# Patient Record
Sex: Female | Born: 1995 | Race: White | Hispanic: No | Marital: Married | Smoking: Never smoker
Health system: Southern US, Community
[De-identification: ages and names within clinical notes are randomized; demographics above are authoritative.]

## PROBLEM LIST (undated history)

## (undated) DIAGNOSIS — F53 Postpartum depression: Secondary | ICD-10-CM

## (undated) DIAGNOSIS — E669 Obesity, unspecified: Secondary | ICD-10-CM

## (undated) DIAGNOSIS — K8050 Calculus of bile duct without cholangitis or cholecystitis without obstruction: Secondary | ICD-10-CM

## (undated) DIAGNOSIS — O99345 Other mental disorders complicating the puerperium: Secondary | ICD-10-CM

## (undated) HISTORY — PX: OTHER SURGICAL HISTORY: SHX169

## (undated) HISTORY — DX: Other mental disorders complicating the puerperium: O99.345

## (undated) HISTORY — PX: TONSILLECTOMY AND ADENOIDECTOMY: SHX28

## (undated) HISTORY — PX: TONSILLECTOMY: SUR1361

## (undated) HISTORY — DX: Postpartum depression: F53.0

## (undated) HISTORY — PX: WISDOM TOOTH EXTRACTION: SHX21

---

## 2017-08-25 DIAGNOSIS — H5213 Myopia, bilateral: Secondary | ICD-10-CM | POA: Diagnosis not present

## 2017-11-24 DIAGNOSIS — Z01419 Encounter for gynecological examination (general) (routine) without abnormal findings: Secondary | ICD-10-CM | POA: Diagnosis not present

## 2017-11-24 DIAGNOSIS — Z308 Encounter for other contraceptive management: Secondary | ICD-10-CM | POA: Diagnosis not present

## 2017-12-14 DIAGNOSIS — Z30017 Encounter for initial prescription of implantable subdermal contraceptive: Secondary | ICD-10-CM | POA: Diagnosis not present

## 2017-12-14 DIAGNOSIS — Z3202 Encounter for pregnancy test, result negative: Secondary | ICD-10-CM | POA: Diagnosis not present

## 2018-03-06 DIAGNOSIS — H6123 Impacted cerumen, bilateral: Secondary | ICD-10-CM | POA: Diagnosis not present

## 2018-03-06 DIAGNOSIS — H60501 Unspecified acute noninfective otitis externa, right ear: Secondary | ICD-10-CM | POA: Diagnosis not present

## 2018-03-18 DIAGNOSIS — R12 Heartburn: Secondary | ICD-10-CM | POA: Diagnosis not present

## 2018-03-18 DIAGNOSIS — R109 Unspecified abdominal pain: Secondary | ICD-10-CM | POA: Diagnosis not present

## 2018-04-11 DIAGNOSIS — L259 Unspecified contact dermatitis, unspecified cause: Secondary | ICD-10-CM | POA: Diagnosis not present

## 2018-10-02 DIAGNOSIS — Z3046 Encounter for surveillance of implantable subdermal contraceptive: Secondary | ICD-10-CM | POA: Diagnosis not present

## 2018-10-02 DIAGNOSIS — Z124 Encounter for screening for malignant neoplasm of cervix: Secondary | ICD-10-CM | POA: Diagnosis not present

## 2018-12-05 NOTE — L&D Delivery Note (Signed)
Delivery Note At  1453 a viable and healthy female "Francoise Ceo"  was delivered via  (Presentation:OP ;  ).  APGAR: 8, 9; weight pending skin to skin  .   Placenta status: delivered intact with 3 vessel  Cord:  with the following complications: none  Anesthesia:  epidural Episiotomy:  none Lacerations:  superficial 1st degree- no repair needed Suture Repair: NA Est. Blood Loss (mL):  200  Mom to postpartum.  Baby to Couplet care / Skin to Skin.  Kalim Kissel N Tanmay Halteman 08/22/2019, 3:07 PM

## 2018-12-22 DIAGNOSIS — M79604 Pain in right leg: Secondary | ICD-10-CM | POA: Diagnosis not present

## 2018-12-22 DIAGNOSIS — O26891 Other specified pregnancy related conditions, first trimester: Secondary | ICD-10-CM | POA: Diagnosis not present

## 2018-12-22 DIAGNOSIS — L52 Erythema nodosum: Secondary | ICD-10-CM | POA: Diagnosis not present

## 2018-12-22 DIAGNOSIS — Z3A00 Weeks of gestation of pregnancy not specified: Secondary | ICD-10-CM | POA: Diagnosis not present

## 2018-12-22 DIAGNOSIS — R7989 Other specified abnormal findings of blood chemistry: Secondary | ICD-10-CM | POA: Diagnosis not present

## 2018-12-22 DIAGNOSIS — O9989 Other specified diseases and conditions complicating pregnancy, childbirth and the puerperium: Secondary | ICD-10-CM | POA: Diagnosis not present

## 2018-12-25 DIAGNOSIS — N912 Amenorrhea, unspecified: Secondary | ICD-10-CM | POA: Diagnosis not present

## 2019-01-01 DIAGNOSIS — Z3490 Encounter for supervision of normal pregnancy, unspecified, unspecified trimester: Secondary | ICD-10-CM | POA: Diagnosis not present

## 2019-01-08 DIAGNOSIS — Z3490 Encounter for supervision of normal pregnancy, unspecified, unspecified trimester: Secondary | ICD-10-CM | POA: Diagnosis not present

## 2019-01-16 DIAGNOSIS — Z3490 Encounter for supervision of normal pregnancy, unspecified, unspecified trimester: Secondary | ICD-10-CM | POA: Diagnosis not present

## 2019-01-16 DIAGNOSIS — Z3A01 Less than 8 weeks gestation of pregnancy: Secondary | ICD-10-CM | POA: Diagnosis not present

## 2019-01-20 DIAGNOSIS — Z3490 Encounter for supervision of normal pregnancy, unspecified, unspecified trimester: Secondary | ICD-10-CM | POA: Diagnosis not present

## 2019-01-20 DIAGNOSIS — Z3A01 Less than 8 weeks gestation of pregnancy: Secondary | ICD-10-CM | POA: Diagnosis not present

## 2019-02-05 DIAGNOSIS — Z36 Encounter for antenatal screening for chromosomal anomalies: Secondary | ICD-10-CM | POA: Diagnosis not present

## 2019-02-05 DIAGNOSIS — Z3A10 10 weeks gestation of pregnancy: Secondary | ICD-10-CM | POA: Diagnosis not present

## 2019-02-05 DIAGNOSIS — O99210 Obesity complicating pregnancy, unspecified trimester: Secondary | ICD-10-CM | POA: Diagnosis not present

## 2019-02-05 DIAGNOSIS — Z3401 Encounter for supervision of normal first pregnancy, first trimester: Secondary | ICD-10-CM | POA: Diagnosis not present

## 2019-02-05 DIAGNOSIS — Z31430 Encounter of female for testing for genetic disease carrier status for procreative management: Secondary | ICD-10-CM | POA: Diagnosis not present

## 2019-02-05 DIAGNOSIS — Z3490 Encounter for supervision of normal pregnancy, unspecified, unspecified trimester: Secondary | ICD-10-CM | POA: Diagnosis not present

## 2019-02-19 DIAGNOSIS — Z3A10 10 weeks gestation of pregnancy: Secondary | ICD-10-CM | POA: Diagnosis not present

## 2019-02-19 DIAGNOSIS — Z3490 Encounter for supervision of normal pregnancy, unspecified, unspecified trimester: Secondary | ICD-10-CM | POA: Diagnosis not present

## 2019-04-07 DIAGNOSIS — R1030 Lower abdominal pain, unspecified: Secondary | ICD-10-CM | POA: Diagnosis not present

## 2019-04-07 DIAGNOSIS — R109 Unspecified abdominal pain: Secondary | ICD-10-CM | POA: Diagnosis not present

## 2019-04-07 DIAGNOSIS — D72829 Elevated white blood cell count, unspecified: Secondary | ICD-10-CM | POA: Diagnosis not present

## 2019-04-07 DIAGNOSIS — O219 Vomiting of pregnancy, unspecified: Secondary | ICD-10-CM | POA: Diagnosis not present

## 2019-04-07 DIAGNOSIS — O26892 Other specified pregnancy related conditions, second trimester: Secondary | ICD-10-CM | POA: Diagnosis not present

## 2019-04-07 DIAGNOSIS — O9989 Other specified diseases and conditions complicating pregnancy, childbirth and the puerperium: Secondary | ICD-10-CM | POA: Diagnosis not present

## 2019-04-07 DIAGNOSIS — M545 Low back pain: Secondary | ICD-10-CM | POA: Diagnosis not present

## 2019-04-07 DIAGNOSIS — O99112 Other diseases of the blood and blood-forming organs and certain disorders involving the immune mechanism complicating pregnancy, second trimester: Secondary | ICD-10-CM | POA: Diagnosis not present

## 2019-04-07 DIAGNOSIS — Z9049 Acquired absence of other specified parts of digestive tract: Secondary | ICD-10-CM | POA: Diagnosis not present

## 2019-04-07 DIAGNOSIS — R112 Nausea with vomiting, unspecified: Secondary | ICD-10-CM | POA: Diagnosis not present

## 2019-04-07 DIAGNOSIS — Z3A19 19 weeks gestation of pregnancy: Secondary | ICD-10-CM | POA: Diagnosis not present

## 2019-04-09 DIAGNOSIS — Z369 Encounter for antenatal screening, unspecified: Secondary | ICD-10-CM | POA: Diagnosis not present

## 2019-04-09 DIAGNOSIS — Z3A19 19 weeks gestation of pregnancy: Secondary | ICD-10-CM | POA: Diagnosis not present

## 2019-04-09 DIAGNOSIS — Z3490 Encounter for supervision of normal pregnancy, unspecified, unspecified trimester: Secondary | ICD-10-CM | POA: Diagnosis not present

## 2019-04-11 DIAGNOSIS — Z3A19 19 weeks gestation of pregnancy: Secondary | ICD-10-CM | POA: Diagnosis not present

## 2019-04-11 DIAGNOSIS — Z369 Encounter for antenatal screening, unspecified: Secondary | ICD-10-CM | POA: Diagnosis not present

## 2019-04-17 DIAGNOSIS — Z3483 Encounter for supervision of other normal pregnancy, third trimester: Secondary | ICD-10-CM | POA: Diagnosis not present

## 2019-04-17 DIAGNOSIS — Z3482 Encounter for supervision of other normal pregnancy, second trimester: Secondary | ICD-10-CM | POA: Diagnosis not present

## 2019-06-05 DIAGNOSIS — Z3490 Encounter for supervision of normal pregnancy, unspecified, unspecified trimester: Secondary | ICD-10-CM | POA: Diagnosis not present

## 2019-06-05 LAB — OB RESULTS CONSOLE HEPATITIS B SURFACE ANTIGEN: Hepatitis B Surface Ag: NEGATIVE

## 2019-06-11 ENCOUNTER — Telehealth: Payer: Self-pay | Admitting: Certified Nurse Midwife

## 2019-06-11 NOTE — Telephone Encounter (Signed)
Pt's previous office *[place] a MRR to our office to obtain records, Sent fax notifying the office Pt has not been seen in our office yet. Thank you.

## 2019-06-26 DIAGNOSIS — Z3490 Encounter for supervision of normal pregnancy, unspecified, unspecified trimester: Secondary | ICD-10-CM | POA: Diagnosis not present

## 2019-06-26 DIAGNOSIS — Z3A30 30 weeks gestation of pregnancy: Secondary | ICD-10-CM | POA: Diagnosis not present

## 2019-06-26 DIAGNOSIS — O26849 Uterine size-date discrepancy, unspecified trimester: Secondary | ICD-10-CM | POA: Diagnosis not present

## 2019-07-03 ENCOUNTER — Ambulatory Visit (INDEPENDENT_AMBULATORY_CARE_PROVIDER_SITE_OTHER): Payer: 59 | Admitting: Certified Nurse Midwife

## 2019-07-03 ENCOUNTER — Other Ambulatory Visit: Payer: Self-pay

## 2019-07-03 ENCOUNTER — Encounter: Payer: Self-pay | Admitting: Certified Nurse Midwife

## 2019-07-03 VITALS — BP 103/67 | HR 90 | Ht 64.0 in | Wt 217.4 lb

## 2019-07-03 DIAGNOSIS — Z3403 Encounter for supervision of normal first pregnancy, third trimester: Secondary | ICD-10-CM

## 2019-07-03 LAB — POCT URINALYSIS DIPSTICK OB
Bilirubin, UA: NEGATIVE
Blood, UA: NEGATIVE
Glucose, UA: NEGATIVE
Ketones, UA: NEGATIVE
Leukocytes, UA: NEGATIVE
Nitrite, UA: NEGATIVE
POC,PROTEIN,UA: NEGATIVE
Spec Grav, UA: 1.02 (ref 1.010–1.025)
Urobilinogen, UA: 0.2 E.U./dL
pH, UA: 6 (ref 5.0–8.0)

## 2019-07-03 NOTE — Patient Instructions (Signed)

## 2019-07-03 NOTE — Progress Notes (Signed)
TRANSFER IN OB HISTORY AND PHYSICAL  SUBJECTIVE:       Valerie Garner is a 23 y.o. No obstetric history on file. female, Patient's last menstrual period was 11/19/2018., Estimated Date of Delivery: None noted., Unknown, presents today for Transition of Prenatal Care. EPIC data migration has not occurred today. Medical release signed. Pt state she has been getting regular care . Glucose test was negative per pt.    Gynecologic History Patient's last menstrual period was 11/19/2018. Normal Contraception: none Last Pap: Oct 2019. Results were: normal per pt  Obstetric History OB History  Gravida Para Term Preterm AB Living  1            SAB TAB Ectopic Multiple Live Births               # Outcome Date GA Lbr Len/2nd Weight Sex Delivery Anes PTL Lv  1 Current             History reviewed. No pertinent past medical history.  History reviewed. No pertinent surgical history.  Current Outpatient Medications on File Prior to Visit  Medication Sig Dispense Refill  . metoCLOPramide (REGLAN) 10 MG tablet Take 10 mg by mouth as needed.    . ondansetron (ZOFRAN) 4 MG tablet Take 4 mg by mouth as needed.    . Prenatal Vit-Fe Fumarate-FA (MULTIVITAMIN-PRENATAL) 27-0.8 MG TABS tablet Take 1 tablet by mouth daily at 12 noon.     No current facility-administered medications on file prior to visit.     No Known Allergies  Social History   Socioeconomic History  . Marital status: Married    Spouse name: Not on file  . Number of children: Not on file  . Years of education: Not on file  . Highest education level: Not on file  Occupational History  . Not on file  Social Needs  . Financial resource strain: Not on file  . Food insecurity    Worry: Not on file    Inability: Not on file  . Transportation needs    Medical: Not on file    Non-medical: Not on file  Tobacco Use  . Smoking status: Not on file  Substance and Sexual Activity  . Alcohol use: Not on file  . Drug use: Not  on file  . Sexual activity: Not on file  Lifestyle  . Physical activity    Days per week: Not on file    Minutes per session: Not on file  . Stress: Not on file  Relationships  . Social Herbalist on phone: Not on file    Gets together: Not on file    Attends religious service: Not on file    Active member of club or organization: Not on file    Attends meetings of clubs or organizations: Not on file    Relationship status: Not on file  . Intimate partner violence    Fear of current or ex partner: Not on file    Emotionally abused: Not on file    Physically abused: Not on file    Forced sexual activity: Not on file  Other Topics Concern  . Not on file  Social History Narrative  . Not on file    History reviewed. No pertinent family history.  The following portions of the patient's history were reviewed and updated as appropriate: allergies, current medications, past OB history, past medical history, past surgical history, past family history, past social history, and problem  list.    OBJECTIVE: Initial Physical Exam (New OB)  GENERAL APPEARANCE: alert, well appearing, in no apparent distress, overweight HEAD: normocephalic, atraumatic MOUTH: mucous membranes moist, pharynx normal without lesions THYROID: no thyromegaly or masses present BREASTS: no masses noted, no significant tenderness, no palpable axillary nodes, no skin changes, not examined LUNGS: clear to auscultation, no wheezes, rales or rhonchi, symmetric air entry HEART: regular rate and rhythm, no murmurs ABDOMEN: soft, nontender, nondistended, no abnormal masses, no epigastric pain and fundus soft, nontender 31 weeks size EXTREMITIES: no redness or tenderness in the calves or thighs, no edema, no limitation in range of motion, intact peripheral pulses SKIN: normal coloration and turgor, no rashes LYMPH NODES: no adenopathy palpable NEUROLOGIC: alert, oriented, normal speech, no focal findings or  movement disorder noted  PELVIC EXAM not examined   ASSESSMENT: Normal pregnancy  PLAN: New OB counseling: The patient has been given an overview regarding routine prenatal care. Recommendations regarding diet, weight gain, and exercise in pregnancy were given. Prenatal testing, optional genetic testing, and ultrasound use in pregnancy were reviewed. PT states she had done at 10 wks . Results were normal.   S Benefits of Breast Feeding were discussed. The patient is encouraged to consider nursing her baby post partum.Follow up 2 wk ROB with Melody.  Doreene BurkeAnnie Monte Zinni, CNM

## 2019-07-03 NOTE — Progress Notes (Signed)
NP NOB transfer, no complaints.

## 2019-07-04 LAB — OB RESULTS CONSOLE ABO/RH: RH Type: POSITIVE

## 2019-07-04 LAB — OB RESULTS CONSOLE HIV ANTIBODY (ROUTINE TESTING): HIV: NONREACTIVE

## 2019-07-04 LAB — OB RESULTS CONSOLE GC/CHLAMYDIA
Chlamydia: NEGATIVE
Gonorrhea: NEGATIVE

## 2019-07-04 LAB — OB RESULTS CONSOLE PLATELET COUNT: Platelets: 360

## 2019-07-04 LAB — OB RESULTS CONSOLE RPR: RPR: NONREACTIVE

## 2019-07-04 LAB — OB RESULTS CONSOLE VARICELLA ZOSTER ANTIBODY, IGG: Varicella: IMMUNE

## 2019-07-04 LAB — OB RESULTS CONSOLE RUBELLA ANTIBODY, IGM: Rubella: IMMUNE

## 2019-07-25 ENCOUNTER — Other Ambulatory Visit: Payer: Self-pay

## 2019-07-25 ENCOUNTER — Ambulatory Visit (INDEPENDENT_AMBULATORY_CARE_PROVIDER_SITE_OTHER): Payer: 59 | Admitting: Obstetrics and Gynecology

## 2019-07-25 VITALS — BP 103/56 | HR 85 | Wt 213.4 lb

## 2019-07-25 DIAGNOSIS — Z3493 Encounter for supervision of normal pregnancy, unspecified, third trimester: Secondary | ICD-10-CM

## 2019-07-25 LAB — POCT URINALYSIS DIPSTICK OB
Bilirubin, UA: NEGATIVE
Blood, UA: NEGATIVE
Glucose, UA: NEGATIVE
Ketones, UA: NEGATIVE
Leukocytes, UA: NEGATIVE
Nitrite, UA: NEGATIVE
POC,PROTEIN,UA: NEGATIVE
Spec Grav, UA: 1.015 (ref 1.010–1.025)
Urobilinogen, UA: 0.2 E.U./dL
pH, UA: 6 (ref 5.0–8.0)

## 2019-07-25 NOTE — Progress Notes (Signed)
ROB- reviewed labs and records, discussed classes and will look at them on line. Desires breast feeding. Cultures next visit, will work on birth plan.

## 2019-07-25 NOTE — Progress Notes (Signed)
ROB- pt is doing well 

## 2019-08-01 ENCOUNTER — Telehealth: Payer: Self-pay | Admitting: Certified Nurse Midwife

## 2019-08-01 NOTE — Telephone Encounter (Signed)
The patient called and stated that she needs to speak with a provider today if possible. Please advise.

## 2019-08-01 NOTE — Telephone Encounter (Signed)
Called and spoke with patient.  Patient wondering of lice treatment is ok during pregnancy.  Advised patient to use RID lice treatment and to avoid [place].  Patient verbalized understanding.

## 2019-08-07 ENCOUNTER — Telehealth: Payer: Self-pay

## 2019-08-07 NOTE — Telephone Encounter (Signed)
Coronavirus (COVID-19) Are you at risk?  Are you at risk for the Coronavirus (COVID-19)?  To be considered HIGH RISK for Coronavirus (COVID-19), you have to meet the following criteria:  . Traveled to [place], [place], [place], [place] or [place]; or in the [place] to [place], [place], [place], or [place]; and have fever, cough, and shortness of breath within the last 2 weeks of travel OR . Been in close contact with a person diagnosed with COVID-19 within the last 2 weeks and have fever, cough, and shortness of breath . IF YOU DO NOT MEET THESE CRITERIA, YOU ARE CONSIDERED LOW RISK FOR COVID-19.  What to do if you are HIGH RISK for COVID-19?  . If you are having a medical emergency, call 911. . Seek medical care right away. Before you go to a doctor's office, urgent care or emergency department, call ahead and tell them about your recent travel, contact with someone diagnosed with COVID-19, and your symptoms. You should receive instructions from your physician's office regarding next steps of care.  . When you arrive at healthcare provider, tell the healthcare staff immediately you have returned from visiting [place], [place], [place], [place] or [place]; or traveled in the [place] to [place], [place], [place], or [place]; in the last two weeks or you have been in close contact with a person diagnosed with COVID-19 in the last 2 weeks.   . Tell the health care staff about your symptoms: fever, cough and shortness of breath. . After you have been seen by a medical provider, you will be either: o Tested for (COVID-19) and discharged home on quarantine except to seek medical care if symptoms worsen, and asked to  - Stay home and avoid contact with others until you get your results (4-5 days)  - Avoid travel on public transportation if possible (such as bus, train, or airplane) or o Sent to the Emergency Department by EMS for evaluation, COVID-19 testing, and possible  admission depending on your condition and test results.  What to do if you are LOW RISK for COVID-19?  Reduce your risk of any infection by using the same precautions used for avoiding the common cold or flu:  . Wash your hands often with soap and warm water for at least 20 seconds.  If soap and water are not readily available, use an alcohol-based hand sanitizer with at least 60% alcohol.  . If coughing or sneezing, cover your mouth and nose by coughing or sneezing into the elbow areas of your shirt or coat, into a tissue or into your sleeve (not your hands). . Avoid shaking hands with others and consider head nods or verbal greetings only. . Avoid touching your eyes, nose, or mouth with unwashed hands.  . Avoid close contact with people who are Valerie Garner. . Avoid places or events with large numbers of people in one location, like concerts or sporting events. . Carefully consider travel plans you have or are making. . If you are planning any travel outside or inside the [place], visit the CDC's Travelers' Health webpage for the latest health notices. . If you have some symptoms but not all symptoms, continue to monitor at home and seek medical attention if your symptoms worsen. . If you are having a medical emergency, call 911.  08/07/19 SCREENING NEG SLS ADDITIONAL HEALTHCARE OPTIONS FOR PATIENTS  [facility] / e-Visit: [email or web address]         [facility]: [phone]    [facility]: [phone]                   [facility]: [phone]  

## 2019-08-08 ENCOUNTER — Other Ambulatory Visit: Payer: Self-pay

## 2019-08-08 ENCOUNTER — Ambulatory Visit (INDEPENDENT_AMBULATORY_CARE_PROVIDER_SITE_OTHER): Payer: 59 | Admitting: Certified Nurse Midwife

## 2019-08-08 VITALS — BP 108/86 | HR 91 | Wt 210.2 lb

## 2019-08-08 DIAGNOSIS — Z3A36 36 weeks gestation of pregnancy: Secondary | ICD-10-CM

## 2019-08-08 DIAGNOSIS — O99213 Obesity complicating pregnancy, third trimester: Secondary | ICD-10-CM | POA: Insufficient documentation

## 2019-08-08 DIAGNOSIS — Z3685 Encounter for antenatal screening for Streptococcus B: Secondary | ICD-10-CM | POA: Diagnosis not present

## 2019-08-08 DIAGNOSIS — Z113 Encounter for screening for infections with a predominantly sexual mode of transmission: Secondary | ICD-10-CM | POA: Diagnosis not present

## 2019-08-08 DIAGNOSIS — Z3493 Encounter for supervision of normal pregnancy, unspecified, third trimester: Secondary | ICD-10-CM | POA: Diagnosis not present

## 2019-08-08 DIAGNOSIS — O99210 Obesity complicating pregnancy, unspecified trimester: Secondary | ICD-10-CM | POA: Insufficient documentation

## 2019-08-08 LAB — POCT URINALYSIS DIPSTICK OB
Bilirubin, UA: NEGATIVE
Blood, UA: NEGATIVE
Glucose, UA: NEGATIVE
Leukocytes, UA: NEGATIVE
Nitrite, UA: NEGATIVE
Spec Grav, UA: 1.025 (ref 1.010–1.025)
Urobilinogen, UA: 0.2 E.U./dL
pH, UA: 6 (ref 5.0–8.0)

## 2019-08-08 NOTE — Progress Notes (Signed)
ROB-Reports single episodes of left lower abdominal pain. Discussed home treatment measures including use of abdominal support. Third trimester handouts provided. 36 week cultures collected, see orders. Anticipatory guidance regarding course of prenatal care. Reviewed red flag symptoms, signs of labor and when to call. RTC x 1 week for ROB or sooner if needed.

## 2019-08-08 NOTE — Progress Notes (Signed)
ROB-Patient c/o 1 episode of lower left abdominal pain when turning 3 days ago.

## 2019-08-08 NOTE — Patient Instructions (Addendum)
Pain Relief During Labor and Delivery Many things can cause pain during labor and delivery, including:  Pressure on bones and ligaments due to the baby moving through the pelvis.  Stretching of tissues due to the baby moving through the birth canal.  Muscle tension due to anxiety or nervousness.  The uterus tightening (contracting) and relaxing to help move the baby. There are many ways to deal with the pain of labor and delivery. They include:  Taking prenatal classes. Taking these classes helps you know what to expect during your baby's birth. What you learn will increase your confidence and decrease your anxiety.  Practicing relaxation techniques or doing relaxing activities, such as: ? Focused breathing. ? Meditation. ? Visualization. ? Aroma therapy. ? Listening to your favorite music. ? Hypnosis.  Taking a warm shower or bath (hydrotherapy). This may: ? Provide comfort and relaxation. ? Lessen your perception of pain. ? Decrease the amount of pain medicine needed. ? Decrease the length of labor.  Getting a massage or counterpressure on your back.  Applying warm packs or ice packs.  Changing positions often, moving around, or using a birthing ball.  Getting: ? Pain medicine through an IV or injection into a muscle. ? Pain medicine inserted into your spinal column. ? Injections of sterile water just under the skin on your lower back (intradermal injections). ? Laughing gas (nitrous oxide). Discuss your pain control options with your health care provider during your prenatal visits. Explore the options offered by your hospital or birth center. What kinds of medicine are available? There are two kinds of medicines that can be used to relieve pain during labor and delivery:  Analgesics. These medicines decrease pain without causing you to lose feeling or the ability to move your muscles.  Anesthetics. These medicines block feeling in the body and can decrease your  ability to move freely. Both of these kinds of medicine can cause minor side effects, such as nausea, trouble concentrating, and sleepiness. They can also decrease the baby's heart rate before birth and affect the baby's breathing rate after birth. For this reason, health care providers are careful about when and how much medicine is given. What are specific medicines and procedures that provide pain relief? Local Anesthetics Local anesthetics are used to numb a small area of the body. They may be used along with another kind of anesthetic or used to numb the nerves of the vagina, cervix, and perineum during the second stage of labor. General Anesthetics General anesthetics cause you to lose consciousness so you do not feel pain. They are usually only used for an emergency cesarean delivery. General anesthetics are given through an IV tube and a mask. Pudendal Block A pudendal block is a form of local anesthetic. It may be used to relieve the pain associated with pushing or stretching of the perineum at the time of delivery or to further numb the perineum. A pudendal block is done by injecting numbing medicine through the vaginal wall into a nerve in the pelvis. Epidural Analgesia Epidural analgesia is given through a flexible IV catheter that is inserted into the lower back. Numbing medicine is delivered continuously to the area near your spinal column nerves (epidural space). After having this type of analgesia, you may be able to move your legs but you most likely will not be able to walk. Depending on the amount of medicine given, you may lose all feeling in the lower half of your body, or you may retain some level   of sensation, including the urge to push. Epidural analgesia can be used to provide pain relief for a vaginal birth. Spinal Block A spinal block is similar to epidural analgesia, but the medicine is injected into the spinal fluid instead of the epidural space. A spinal block is only given  once. It starts to relieve pain quickly, but the pain relief lasts only 1-6 hours. Spinal blocks can be used for cesarean deliveries. Combined Spinal-Epidural (CSE) Block A CSE block combines the effects of a spinal block and epidural analgesia. The spinal block works quickly to block all pain. The epidural analgesia provides continuous pain relief, even after the effects of the spinal block have worn off. This information is not intended to replace advice given to you by your health care provider. Make sure you discuss any questions you have with your health care provider. Document Released: 03/09/2009 Document Revised: 11/03/2017 Document Reviewed: 04/13/2016 Elsevier Patient Education  2020 [employer]. Fetal Movement Counts Patient Name: ________________________________________________ Patient Due Date: ____________________ What is a fetal movement count?  A fetal movement count is the number of times that you feel your baby move during a certain amount of time. This may also be called a fetal kick count. A fetal movement count is recommended for every pregnant woman. You may be asked to start counting fetal movements as early as week 28 of your pregnancy. Pay attention to when your baby is most active. You may notice your baby's sleep and wake cycles. You may also notice things that make your baby move more. You should do a fetal movement count:  When your baby is normally most active.  At the same time each day. A good time to count movements is while you are resting, after having something to eat and drink. How do I count fetal movements? 1. Find a quiet, comfortable area. Sit, or lie down on your side. 2. Write down the date, the start time and stop time, and the number of movements that you felt between those two times. Take this information with you to your health care visits. 3. For 2 hours, count kicks, flutters, swishes, rolls, and jabs. You should feel at least 10 movements during  2 hours. 4. You may stop counting after you have felt 10 movements. 5. If you do not feel 10 movements in 2 hours, have something to eat and drink. Then, keep resting and counting for 1 hour. If you feel at least 4 movements during that hour, you may stop counting. Contact a health care provider if:  You feel fewer than 4 movements in 2 hours.  Your baby is not moving like he or she usually does. Date: ____________ Start time: ____________ Stop time: ____________ Movements: ____________ Date: ____________ Start time: ____________ Stop time: ____________ Movements: ____________ Date: ____________ Start time: ____________ Stop time: ____________ Movements: ____________ Date: ____________ Start time: ____________ Stop time: ____________ Movements: ____________ Date: ____________ Start time: ____________ Stop time: ____________ Movements: ____________ Date: ____________ Start time: ____________ Stop time: ____________ Movements: ____________ Date: ____________ Start time: ____________ Stop time: ____________ Movements: ____________ Date: ____________ Start time: ____________ Stop time: ____________ Movements: ____________ Date: ____________ Start time: ____________ Stop time: ____________ Movements: ____________ This information is not intended to replace advice given to you by your health care provider. Make sure you discuss any questions you have with your health care provider. Document Released: 12/21/2006 Document Revised: 12/11/2018 Document Reviewed: 12/31/2015 Elsevier Patient Education  2020 [employer]. Vaginal Delivery  Vaginal delivery means that  you give birth by pushing your baby out of your birth canal (vagina). A team of health care providers will help you before, during, and after vaginal delivery. Birth experiences are unique for every woman and every pregnancy, and birth experiences vary depending on where you choose to give birth. What happens when I arrive at the birth center  or hospital? Once you are in labor and have been admitted into the hospital or birth center, your health care provider may:  Review your pregnancy history and any concerns that you have.  Insert an IV into one of your veins. This may be used to give you fluids and medicines.  Check your blood pressure, pulse, temperature, and heart rate (vital signs).  Check whether your bag of water (amniotic sac) has broken (ruptured).  Talk with you about your birth plan and discuss pain control options. Monitoring Your health care provider may monitor your contractions (uterine monitoring) and your baby's heart rate (fetal monitoring). You may need to be monitored:  Often, but not continuously (intermittently).  All the time or for long periods at a time (continuously). Continuous monitoring may be needed if: ? You are taking certain medicines, such as medicine to relieve pain or make your contractions stronger. ? You have pregnancy or labor complications. Monitoring may be done by:  Placing a special stethoscope or a handheld monitoring device on your abdomen to check your baby's heartbeat and to check for contractions.  Placing monitors on your abdomen (external monitors) to record your baby's heartbeat and the frequency and length of contractions.  Placing monitors inside your uterus through your vagina (internal monitors) to record your baby's heartbeat and the frequency, length, and strength of your contractions. Depending on the type of monitor, it may remain in your uterus or on your baby's head until birth.  Telemetry. This is a type of continuous monitoring that can be done with external or internal monitors. Instead of having to stay in bed, you are able to move around during telemetry. Physical exam Your health care provider may perform frequent physical exams. This may include:  Checking how and where your baby is positioned in your uterus.  Checking your cervix to determine: ?  Whether it is thinning out (effacing). ? Whether it is opening up (dilating). What happens during labor and delivery?  Normal labor and delivery is divided into the following three stages: Stage 1  This is the longest stage of labor.  This stage can last for hours or days.  Throughout this stage, you will feel contractions. Contractions generally feel mild, infrequent, and irregular at first. They get stronger, more frequent (about every 2-3 minutes), and more regular as you move through this stage.  This stage ends when your cervix is completely dilated to 4 inches (10 cm) and completely effaced. Stage 2  This stage starts once your cervix is completely effaced and dilated and lasts until the delivery of your baby.  This stage may last from 20 minutes to 2 hours.  This is the stage where you will feel an urge to push your baby out of your vagina.  You may feel stretching and burning pain, especially when the widest part of your baby's head passes through the vaginal opening (crowning).  Once your baby is delivered, the umbilical cord will be clamped and cut. This usually occurs after waiting a period of 1-2 minutes after delivery.  Your baby will be placed on your bare chest (skin-to-skin contact) in an upright position  and covered with a warm blanket. Watch your baby for feeding cues, like rooting or sucking, and help the baby to your breast for his or her first feeding. Stage 3  This stage starts immediately after the birth of your baby and ends after you deliver the placenta.  This stage may take anywhere from 5 to 30 minutes.  After your baby has been delivered, you will feel contractions as your body expels the placenta and your uterus contracts to control bleeding. What can I expect after labor and delivery?  After labor is over, you and your baby will be monitored closely until you are ready to go home to ensure that you are both healthy. Your health care team will teach  you how to care for yourself and your baby.  You and your baby will stay in the same room (rooming in) during your hospital stay. This will encourage early bonding and successful breastfeeding.  You may continue to receive fluids and medicines through an IV.  Your uterus will be checked and massaged regularly (fundal massage).  You will have some soreness and pain in your abdomen, vagina, and the area of skin between your vaginal opening and your anus (perineum).  If an incision was made near your vagina (episiotomy) or if you had some vaginal tearing during delivery, cold compresses may be placed on your episiotomy or your tear. This helps to reduce pain and swelling.  You may be given a squirt bottle to use instead of wiping when you go to the bathroom. To use the squirt bottle, follow these steps: ? Before you urinate, fill the squirt bottle with warm water. Do not use hot water. ? After you urinate, while you are sitting on the toilet, use the squirt bottle to rinse the area around your urethra and vaginal opening. This rinses away any urine and blood. ? Fill the squirt bottle with clean water every time you use the bathroom.  It is normal to have vaginal bleeding after delivery. Wear a sanitary pad for vaginal bleeding and discharge. Summary  Vaginal delivery means that you will give birth by pushing your baby out of your birth canal (vagina).  Your health care provider may monitor your contractions (uterine monitoring) and your baby's heart rate (fetal monitoring).  Your health care provider may perform a physical exam.  Normal labor and delivery is divided into three stages.  After labor is over, you and your baby will be monitored closely until you are ready to go home. This information is not intended to replace advice given to you by your health care provider. Make sure you discuss any questions you have with your health care provider. Document Released: 08/30/2008 Document  Revised: 12/26/2017 Document Reviewed: 12/26/2017 Elsevier Patient Education  2020 [employer]. [facility]  [address] [place], [place], [place] [address]  Phone: [phone]   [facility] (second location)  [address] [place], [place] [address]  Phone: [phone]   [facility]) [address] [place], [place], [place] [address] Phone: [phone]   [facility]  [address], [place], [place] [address]  Phone: [phone]

## 2019-08-10 LAB — STREP GP B NAA: Strep Gp B NAA: POSITIVE — AB

## 2019-08-12 ENCOUNTER — Other Ambulatory Visit: Payer: Self-pay

## 2019-08-12 ENCOUNTER — Observation Stay
Admission: EM | Admit: 2019-08-12 | Discharge: 2019-08-12 | Disposition: A | Discharge status: Home / Self Care | Payer: 59 | Attending: Obstetrics and Gynecology | Admitting: Obstetrics and Gynecology

## 2019-08-12 DIAGNOSIS — Z3A37 37 weeks gestation of pregnancy: Secondary | ICD-10-CM | POA: Insufficient documentation

## 2019-08-12 DIAGNOSIS — Z3403 Encounter for supervision of normal first pregnancy, third trimester: Principal | ICD-10-CM | POA: Insufficient documentation

## 2019-08-12 LAB — RUPTURE OF MEMBRANE (ROM)PLUS: Rom Plus: NEGATIVE

## 2019-08-12 NOTE — OB Triage Note (Signed)
Pt presents to L&D stating she thinks she might be leaking fluid. Pt is a G1P0 [redacted]w[redacted]d. Pt denies vaginal bleeding or contractions and states positive fetal movement. Pt states she is concerned about her fluid leaking due to not gaining weight during her pregnancy. Pt describes the fluid as "thick clear discharge". Pt states, "my mother told me to get checked out and see if baby has enough fluid since I did not gain a single pound during the pregnancy." External monitors applied and assessing. Initial FHT 150. VS WNL. ROM plus ordered and resulted negative.

## 2019-08-13 LAB — GC/CHLAMYDIA PROBE AMP
Chlamydia trachomatis, NAA: NEGATIVE
Neisseria Gonorrhoeae by PCR: NEGATIVE

## 2019-08-14 ENCOUNTER — Encounter: Payer: Self-pay | Admitting: Certified Nurse Midwife

## 2019-08-14 DIAGNOSIS — B951 Streptococcus, group B, as the cause of diseases classified elsewhere: Secondary | ICD-10-CM | POA: Insufficient documentation

## 2019-08-15 ENCOUNTER — Ambulatory Visit (INDEPENDENT_AMBULATORY_CARE_PROVIDER_SITE_OTHER): Payer: 59 | Admitting: Certified Nurse Midwife

## 2019-08-15 ENCOUNTER — Other Ambulatory Visit: Payer: Self-pay

## 2019-08-15 VITALS — BP 123/87 | HR 112 | Wt 212.1 lb

## 2019-08-15 DIAGNOSIS — Z3493 Encounter for supervision of normal pregnancy, unspecified, third trimester: Secondary | ICD-10-CM

## 2019-08-15 LAB — POCT URINALYSIS DIPSTICK OB
Bilirubin, UA: NEGATIVE
Blood, UA: NEGATIVE
Glucose, UA: NEGATIVE
Ketones, UA: NEGATIVE
Leukocytes, UA: NEGATIVE
Nitrite, UA: NEGATIVE
POC,PROTEIN,UA: NEGATIVE
Spec Grav, UA: 1.025 (ref 1.010–1.025)
Urobilinogen, UA: 0.2 E.U./dL
pH, UA: 6 (ref 5.0–8.0)

## 2019-08-15 NOTE — Patient Instructions (Signed)

## 2019-08-15 NOTE — Progress Notes (Signed)
ROB-Doing well, no questions or concerns. Varicella titer collected today, see orders. Education regarding GBS positive status, pt verbalized understanding. Encouraged [employer] [facility] to optimize fetal position. Anticipatory guidance regarding course of prenatal care. Reviewed red flag symptoms and when to call. RTC x 1 week for ROB or sooner if needed.

## 2019-08-16 ENCOUNTER — Telehealth: Payer: Self-pay | Admitting: Certified Nurse Midwife

## 2019-08-16 ENCOUNTER — Ambulatory Visit (INDEPENDENT_AMBULATORY_CARE_PROVIDER_SITE_OTHER): Payer: 59 | Admitting: Certified Nurse Midwife

## 2019-08-16 ENCOUNTER — Observation Stay
Admission: EM | Admit: 2019-08-16 | Discharge: 2019-08-16 | Disposition: A | Discharge status: Home / Self Care | Payer: 59 | Attending: Certified Nurse Midwife | Admitting: Certified Nurse Midwife

## 2019-08-16 ENCOUNTER — Other Ambulatory Visit: Payer: Self-pay

## 2019-08-16 DIAGNOSIS — E669 Obesity, unspecified: Secondary | ICD-10-CM | POA: Diagnosis not present

## 2019-08-16 DIAGNOSIS — O99213 Obesity complicating pregnancy, third trimester: Secondary | ICD-10-CM | POA: Insufficient documentation

## 2019-08-16 DIAGNOSIS — Z3A37 37 weeks gestation of pregnancy: Secondary | ICD-10-CM

## 2019-08-16 DIAGNOSIS — O2200 Varicose veins of lower extremity in pregnancy, unspecified trimester: Secondary | ICD-10-CM | POA: Diagnosis present

## 2019-08-16 DIAGNOSIS — O2203 Varicose veins of lower extremity in pregnancy, third trimester: Secondary | ICD-10-CM | POA: Insufficient documentation

## 2019-08-16 DIAGNOSIS — B951 Streptococcus, group B, as the cause of diseases classified elsewhere: Secondary | ICD-10-CM

## 2019-08-16 DIAGNOSIS — O99820 Streptococcus B carrier state complicating pregnancy: Secondary | ICD-10-CM | POA: Diagnosis not present

## 2019-08-16 DIAGNOSIS — O471 False labor at or after 37 completed weeks of gestation: Secondary | ICD-10-CM | POA: Diagnosis not present

## 2019-08-16 DIAGNOSIS — O479 False labor, unspecified: Secondary | ICD-10-CM

## 2019-08-16 DIAGNOSIS — O4703 False labor before 37 completed weeks of gestation, third trimester: Secondary | ICD-10-CM

## 2019-08-16 LAB — VARICELLA ZOSTER ANTIBODY, IGG: Varicella zoster IgG: 166 index (ref >165)

## 2019-08-16 NOTE — Telephone Encounter (Signed)
Patient may come to office for labor check if desired. Thanks, JML

## 2019-08-16 NOTE — Telephone Encounter (Signed)
The patient called and stated that she is having contractions that are lasting 30 seconds to 1 minute in length and they are 4-7 minutes apart. Pt requesting a call back asap. Please advise.

## 2019-08-16 NOTE — OB Triage Note (Signed)
Patient arrived in triage with c/o contractions since approx 5am, getting closer together throughout the day. Reports contractions approx every 4 minutes. Reports decrease in fetal movement felt today and some spotting, but denies leaking of fluid. EFM applied and assessing. FM audible on monitor.

## 2019-08-16 NOTE — Discharge Instructions (Signed)

## 2019-08-16 NOTE — Progress Notes (Signed)
Labor check-Reports contractions since 0500. Currently contractions are every four (4) to five (5) minutes with soft resting tone. Discussed home labor management techniques for early labor. Reviewed red flag symptoms and when to report to birthing suites, pt and spouse verbalized understanding. RTC as needed.

## 2019-08-16 NOTE — Progress Notes (Signed)
OB WORK IN- pt has been having contractions since 5:00am, here for cervical check

## 2019-08-16 NOTE — Progress Notes (Signed)
Discharge instructions given to pt. Pt verbalized understanding and is leaving with SO.

## 2019-08-17 ENCOUNTER — Encounter: Payer: Self-pay | Admitting: *Deleted

## 2019-08-17 ENCOUNTER — Observation Stay
Admission: EM | Admit: 2019-08-17 | Discharge: 2019-08-18 | Disposition: A | Discharge status: Home / Self Care | Payer: 59 | Attending: Certified Nurse Midwife | Admitting: Certified Nurse Midwife

## 2019-08-17 DIAGNOSIS — Z3A38 38 weeks gestation of pregnancy: Secondary | ICD-10-CM | POA: Diagnosis not present

## 2019-08-17 DIAGNOSIS — B951 Streptococcus, group B, as the cause of diseases classified elsewhere: Secondary | ICD-10-CM

## 2019-08-17 NOTE — OB Triage Note (Signed)
Recvd pt from ED. Pt c/o contractions every 3 min that started around 1930. No LOF or vaginal bleeding. Feeling baby move well. Rates pain a 7 out of 10.

## 2019-08-18 DIAGNOSIS — Z3A38 38 weeks gestation of pregnancy: Secondary | ICD-10-CM

## 2019-08-18 DIAGNOSIS — O4703 False labor before 37 completed weeks of gestation, third trimester: Secondary | ICD-10-CM

## 2019-08-18 NOTE — Discharge Instructions (Signed)

## 2019-08-18 NOTE — OB Triage Note (Signed)
    L&D OB Triage Note  SUBJECTIVE Valerie Garner is a 23 y.o. G1P0 female at [redacted]w[redacted]d, EDD Estimated Date of Delivery: 09/01/19 who presented to triage with complaints of contractions since 1930. She feels good movement , denies LOF and vaginal bleeding.   OB History  Gravida Para Term Preterm AB Living  1 0 0 0 0 0  SAB TAB Ectopic Multiple Live Births  0 0 0 0 0    # Outcome Date GA Lbr Len/2nd Weight Sex Delivery Anes PTL Lv  1 Current             Medications Prior to Admission  Medication Sig Dispense Refill Last Dose  . Prenatal Vit-Fe Fumarate-FA (MULTIVITAMIN-PRENATAL) 27-0.8 MG TABS tablet Take 1 tablet by mouth daily at 12 noon.   08/17/2019 at Unknown time  . metoCLOPramide (REGLAN) 10 MG tablet Take 10 mg by mouth as needed.     . ondansetron (ZOFRAN) 4 MG tablet Take 4 mg by mouth as needed.        OBJECTIVE  Nursing Evaluation:   BP 109/68 (BP Location: Right Arm)   Pulse 100   Temp 98.3 F (36.8 C)   Resp 18   Ht 5\' 4"  (1.626 m)   Wt 96.2 kg   LMP 11/19/2018   BMI 36.39 kg/m    Findings:  Irregular mild contractions   NST was performed and has been reviewed by me.  NST INTERPRETATION: Category I  Mode: External Baseline Rate (A): 125 bpm Variability: Moderate Accelerations: 15 x 15 Decelerations: None     Contraction Frequency (min): rare  ASSESSMENT Impression:  1.  Pregnancy:  G1P0 at [redacted]w[redacted]d , EDD Estimated Date of Delivery: 09/01/19 2.  NST:  Category I  3. No cervical change  PLAN 1. Reassurance given 2. Discharge home with standard labor precautions given to return to L&D or call the office for problems. 3. Continue routine prenatal care.  Philip Aspen, CNM

## 2019-08-19 ENCOUNTER — Ambulatory Visit (INDEPENDENT_AMBULATORY_CARE_PROVIDER_SITE_OTHER): Payer: 59 | Admitting: Certified Nurse Midwife

## 2019-08-19 ENCOUNTER — Telehealth: Payer: Self-pay

## 2019-08-19 ENCOUNTER — Other Ambulatory Visit: Payer: Self-pay

## 2019-08-19 VITALS — BP 84/48 | HR 95 | Wt 212.6 lb

## 2019-08-19 DIAGNOSIS — Z3403 Encounter for supervision of normal first pregnancy, third trimester: Secondary | ICD-10-CM

## 2019-08-19 MED ORDER — ZOLPIDEM TARTRATE 5 MG PO TABS: 5.0000 mg | ORAL_TABLET | Freq: Every evening | ORAL | 0 refills | Status: DC | PRN

## 2019-08-19 NOTE — Discharge Summary (Signed)
Obstetric Discharge Summary  Patient ID: Valerie Garner MRN: [id number] DOB/AGE: Oct 18, 1996 23 y.o.   Date of Admission: 08/16/2019  Date of Discharge: 08/16/2019  Admitting Diagnosis: Observation at [redacted]w[redacted]d  Secondary Diagnosis: Obesity in pregnancy, Group Beta Strep Positive, Varicose veins in pregnancy     Discharge Diagnosis: No other diagnosis   Antepartum Procedures: NST   Brief Hospital Course   L&D OB Triage Note  Valerie Garner is a 23 y.o. G1P0 female at [redacted]w[redacted]d, EDD Estimated Date of Delivery: 09/01/19 who presented to triage for complaints of uterine contractions.  She was evaluated by the nurses with no significant findings active labor or fetal distress. Vital signs stable. An NST was performed and has been reviewed by CNM.  NST INTERPRETATION: Indications: rule out uterine contractions  Mode: External Baseline Rate (A): 125 bpm Variability: Moderate Accelerations: 15 x 15 Decelerations: None Contraction Frequency (min): rare  Impression: reactive  Dilation: 3 Effacement (%): 50 Station: -3 Exam by:: MBS   Plan: NST performed was reviewed and was found to be reactive. She was discharged home with bleeding/labor precautions.  Continue routine prenatal care. Follow up with CNM as previously scheduled.   Discharge Instructions: Per After Visit Summary.  Activity: Advance as tolerated. Also refer to After Visit Summary  Diet: Regular  Medications: Allergies as of 08/16/2019   No Known Allergies     Medication List    ASK your doctor about these medications   metoCLOPramide 10 MG tablet Commonly known as: REGLAN Take 10 mg by mouth as needed.   multivitamin-prenatal 27-0.8 MG Tabs tablet Take 1 tablet by mouth daily at 12 noon.   ondansetron 4 MG tablet Commonly known as: ZOFRAN Take 4 mg by mouth as needed.       Discharged Condition: stable  Discharged to: home   Diona Fanti, CNM [facility], [facility]

## 2019-08-19 NOTE — Patient Instructions (Signed)
Braxton Hicks Contractions Contractions of the uterus can occur throughout pregnancy, but they are not always a sign that you are in labor. You may have practice contractions called Braxton Hicks contractions. These false labor contractions are sometimes confused with true labor. What are Braxton Hicks contractions? Braxton Hicks contractions are tightening movements that occur in the muscles of the uterus before labor. Unlike true labor contractions, these contractions do not result in opening (dilation) and thinning of the cervix. Toward the end of pregnancy (32-34 weeks), Braxton Hicks contractions can happen more often and may become stronger. These contractions are sometimes difficult to tell apart from true labor because they can be very uncomfortable. You should not feel embarrassed if you go to the hospital with false labor. Sometimes, the only way to tell if you are in true labor is for your health care provider to look for changes in the cervix. The health care provider will do a physical exam and may monitor your contractions. If you are not in true labor, the exam should show that your cervix is not dilating and your water has not broken. If there are no other health problems associated with your pregnancy, it is completely safe for you to be sent home with false labor. You may continue to have Braxton Hicks contractions until you go into true labor. How to tell the difference between true labor and false labor True labor  Contractions last 30-70 seconds.  Contractions become very regular.  Discomfort is usually felt in the top of the uterus, and it spreads to the lower abdomen and low back.  Contractions do not go away with walking.  Contractions usually become more intense and increase in frequency.  The cervix dilates and gets thinner. False labor  Contractions are usually shorter and not as strong as true labor contractions.  Contractions are usually irregular.  Contractions  are often felt in the front of the lower abdomen and in the groin.  Contractions may go away when you walk around or change positions while lying down.  Contractions get weaker and are shorter-lasting as time goes on.  The cervix usually does not dilate or become thin. Follow these instructions at home:   Take over-the-counter and prescription medicines only as told by your health care provider.  Keep up with your usual exercises and follow other instructions from your health care provider.  Eat and drink lightly if you think you are going into labor.  If Braxton Hicks contractions are making you uncomfortable: ? Change your position from lying down or resting to walking, or change from walking to resting. ? Sit and rest in a tub of warm water. ? Drink enough fluid to keep your urine pale yellow. Dehydration may cause these contractions. ? Do slow and deep breathing several times an hour.  Keep all follow-up prenatal visits as told by your health care provider. This is important. Contact a health care provider if:  You have a fever.  You have continuous pain in your abdomen. Get help right away if:  Your contractions become stronger, more regular, and closer together.  You have fluid leaking or gushing from your vagina.  You pass blood-tinged mucus (bloody show).  You have bleeding from your vagina.  You have low back pain that you never had before.  You feel your baby's head pushing down and causing pelvic pressure.  Your baby is not moving inside you as much as it used to. Summary  Contractions that occur before labor are   called Braxton Hicks contractions, false labor, or practice contractions.  Braxton Hicks contractions are usually shorter, weaker, farther apart, and less regular than true labor contractions. True labor contractions usually become progressively stronger and regular, and they become more frequent.  Manage discomfort from Braxton Hicks contractions  by changing position, resting in a warm bath, drinking plenty of water, or practicing deep breathing. This information is not intended to replace advice given to you by your health care provider. Make sure you discuss any questions you have with your health care provider. Document Released: 04/06/2017 Document Revised: 11/03/2017 Document Reviewed: 04/06/2017 Elsevier Patient Education  2020 [employer]  

## 2019-08-19 NOTE — Telephone Encounter (Signed)
The patient called and stated that she would like the Ambien to be prescribed. Please advise.

## 2019-08-19 NOTE — Progress Notes (Signed)
Pt presents today for problem visit. She complains of contractions since Friday. She has been seen in the office and at the hospital for labor assessment (4 times) with no cervical change. She states she had intercourse recently and has noted increase discharge.She presents today for evaluation of labor/ cervix.  Fetal acceleration to 145 during ascultation.  Spec exam normal mucus, Nitrazine negative, no fluids visualized, cough x 1 no fluid seen.  SVE 3-4/70%/-2. No significant change in exam . Pt instructed that she can keep appointment on Wednesday or reschedule for 1 wk. Labor precautions reviewed.   Philip Aspen, CNM

## 2019-08-19 NOTE — Telephone Encounter (Signed)
Message left for patient- c/o not sleeping well at visit today. Per AT Ambien 5 mg # 14 I tab HS prn x 0 can be called to patients pharmacy on file.

## 2019-08-21 ENCOUNTER — Other Ambulatory Visit: Payer: Self-pay

## 2019-08-21 ENCOUNTER — Ambulatory Visit (INDEPENDENT_AMBULATORY_CARE_PROVIDER_SITE_OTHER): Payer: 59 | Admitting: Certified Nurse Midwife

## 2019-08-21 ENCOUNTER — Encounter: Payer: Self-pay | Admitting: Certified Nurse Midwife

## 2019-08-21 DIAGNOSIS — Z23 Encounter for immunization: Secondary | ICD-10-CM

## 2019-08-21 DIAGNOSIS — Z3403 Encounter for supervision of normal first pregnancy, third trimester: Secondary | ICD-10-CM

## 2019-08-21 LAB — POCT URINALYSIS DIPSTICK OB
Bilirubin, UA: NEGATIVE
Blood, UA: NEGATIVE
Glucose, UA: NEGATIVE
Ketones, UA: NEGATIVE
Leukocytes, UA: NEGATIVE
Nitrite, UA: NEGATIVE
POC,PROTEIN,UA: NEGATIVE
Spec Grav, UA: 1.01 (ref 1.010–1.025)
Urobilinogen, UA: 0.2 E.U./dL
pH, UA: 5 (ref 5.0–8.0)

## 2019-08-21 NOTE — Progress Notes (Signed)
ROB doing well. Heart rate accled noted while listening on doppler to 150's.  Pt complains of mucus and blood. Spec exam , no active bleeding, bloody mucus plug noted. SVE 4 cm/70/-2 station. Bag of fluid felt. Reviewed labor precautions. Follow up Monday or Tuesday for ROB.   Philip Aspen, CNM

## 2019-08-21 NOTE — Patient Instructions (Signed)
Braxton Hicks Contractions Contractions of the uterus can occur throughout pregnancy, but they are not always a sign that you are in labor. You may have practice contractions called Braxton Hicks contractions. These false labor contractions are sometimes confused with true labor. What are Braxton Hicks contractions? Braxton Hicks contractions are tightening movements that occur in the muscles of the uterus before labor. Unlike true labor contractions, these contractions do not result in opening (dilation) and thinning of the cervix. Toward the end of pregnancy (32-34 weeks), Braxton Hicks contractions can happen more often and may become stronger. These contractions are sometimes difficult to tell apart from true labor because they can be very uncomfortable. You should not feel embarrassed if you go to the hospital with false labor. Sometimes, the only way to tell if you are in true labor is for your health care provider to look for changes in the cervix. The health care provider will do a physical exam and may monitor your contractions. If you are not in true labor, the exam should show that your cervix is not dilating and your water has not broken. If there are no other health problems associated with your pregnancy, it is completely safe for you to be sent home with false labor. You may continue to have Braxton Hicks contractions until you go into true labor. How to tell the difference between true labor and false labor True labor  Contractions last 30-70 seconds.  Contractions become very regular.  Discomfort is usually felt in the top of the uterus, and it spreads to the lower abdomen and low back.  Contractions do not go away with walking.  Contractions usually become more intense and increase in frequency.  The cervix dilates and gets thinner. False labor  Contractions are usually shorter and not as strong as true labor contractions.  Contractions are usually irregular.  Contractions  are often felt in the front of the lower abdomen and in the groin.  Contractions may go away when you walk around or change positions while lying down.  Contractions get weaker and are shorter-lasting as time goes on.  The cervix usually does not dilate or become thin. Follow these instructions at home:   Take over-the-counter and prescription medicines only as told by your health care provider.  Keep up with your usual exercises and follow other instructions from your health care provider.  Eat and drink lightly if you think you are going into labor.  If Braxton Hicks contractions are making you uncomfortable: ? Change your position from lying down or resting to walking, or change from walking to resting. ? Sit and rest in a tub of warm water. ? Drink enough fluid to keep your urine pale yellow. Dehydration may cause these contractions. ? Do slow and deep breathing several times an hour.  Keep all follow-up prenatal visits as told by your health care provider. This is important. Contact a health care provider if:  You have a fever.  You have continuous pain in your abdomen. Get help right away if:  Your contractions become stronger, more regular, and closer together.  You have fluid leaking or gushing from your vagina.  You pass blood-tinged mucus (bloody show).  You have bleeding from your vagina.  You have low back pain that you never had before.  You feel your baby's head pushing down and causing pelvic pressure.  Your baby is not moving inside you as much as it used to. Summary  Contractions that occur before labor are   called Braxton Hicks contractions, false labor, or practice contractions.  Braxton Hicks contractions are usually shorter, weaker, farther apart, and less regular than true labor contractions. True labor contractions usually become progressively stronger and regular, and they become more frequent.  Manage discomfort from Braxton Hicks contractions  by changing position, resting in a warm bath, drinking plenty of water, or practicing deep breathing. This information is not intended to replace advice given to you by your health care provider. Make sure you discuss any questions you have with your health care provider. Document Released: 04/06/2017 Document Revised: 11/03/2017 Document Reviewed: 04/06/2017 Elsevier Patient Education  2020 [employer]  

## 2019-08-22 ENCOUNTER — Inpatient Hospital Stay: Payer: 59 | Admitting: Anesthesiology

## 2019-08-22 ENCOUNTER — Inpatient Hospital Stay
Admission: EM | Admit: 2019-08-22 | Discharge: 2019-08-23 | DRG: 807 | Disposition: A | Discharge status: Home / Self Care | Payer: 59 | Attending: Certified Nurse Midwife | Admitting: Certified Nurse Midwife

## 2019-08-22 DIAGNOSIS — O1205 Gestational edema, complicating the puerperium: Secondary | ICD-10-CM | POA: Diagnosis not present

## 2019-08-22 DIAGNOSIS — O26893 Other specified pregnancy related conditions, third trimester: Secondary | ICD-10-CM | POA: Diagnosis present

## 2019-08-22 DIAGNOSIS — Z3A38 38 weeks gestation of pregnancy: Secondary | ICD-10-CM

## 2019-08-22 DIAGNOSIS — O99824 Streptococcus B carrier state complicating childbirth: Secondary | ICD-10-CM | POA: Diagnosis not present

## 2019-08-22 DIAGNOSIS — Z20828 Contact with and (suspected) exposure to other viral communicable diseases: Secondary | ICD-10-CM | POA: Diagnosis present

## 2019-08-22 DIAGNOSIS — O4202 Full-term premature rupture of membranes, onset of labor within 24 hours of rupture: Secondary | ICD-10-CM

## 2019-08-22 LAB — SARS CORONAVIRUS 2 BY RT PCR (HOSPITAL ORDER, PERFORMED IN ~~LOC~~ HOSPITAL LAB): SARS Coronavirus 2: NEGATIVE

## 2019-08-22 LAB — CBC
HCT: 37.5 % (ref 36.0–46.0)
Hemoglobin: 12.8 g/dL (ref 12.0–15.0)
MCH: 29.6 pg (ref 26.0–34.0)
MCHC: 34.1 g/dL (ref 30.0–36.0)
MCV: 86.6 fL (ref 80.0–100.0)
Platelets: 290 10*3/uL (ref 150–400)
RBC: 4.33 MIL/uL (ref 3.87–5.11)
RDW: 12.5 % (ref 11.5–15.5)
WBC: 14.4 10*3/uL — ABNORMAL HIGH (ref 4.0–10.5)
nRBC: 0 % (ref 0.0–0.2)

## 2019-08-22 LAB — TYPE AND SCREEN
ABO/RH(D): A POS
Antibody Screen: NEGATIVE

## 2019-08-22 LAB — RPR: RPR Ser Ql: NONREACTIVE

## 2019-08-22 MED ORDER — DIPHENHYDRAMINE HCL 50 MG/ML IJ SOLN: 12.5000 mg | INTRAMUSCULAR | Status: DC | PRN

## 2019-08-22 MED ORDER — ONDANSETRON HCL 4 MG/2ML IJ SOLN
4.0000 mg | Freq: Four times a day (QID) | INTRAMUSCULAR | Status: DC | PRN
  Administered 2019-08-22: 4 mg via INTRAVENOUS
  Filled 2019-08-22: qty 2

## 2019-08-22 MED ORDER — LIDOCAINE HCL (PF) 1 % IJ SOLN
INTRAMUSCULAR | Status: DC | PRN
  Administered 2019-08-22: 3 mL

## 2019-08-22 MED ORDER — MISOPROSTOL 200 MCG PO TABS
ORAL_TABLET | ORAL | Status: AC
  Filled 2019-08-22: qty 4

## 2019-08-22 MED ORDER — ACETAMINOPHEN 325 MG PO TABS
650.0000 mg | ORAL_TABLET | ORAL | Status: DC | PRN
  Administered 2019-08-22 – 2019-08-23 (×4): 650 mg via ORAL
  Filled 2019-08-22 (×4): qty 2

## 2019-08-22 MED ORDER — OXYTOCIN 40 UNITS IN NORMAL SALINE INFUSION - SIMPLE MED
1.0000 m[IU]/min | INTRAVENOUS | Status: DC
  Administered 2019-08-22: 2 m[IU]/min via INTRAVENOUS

## 2019-08-22 MED ORDER — WITCH HAZEL-GLYCERIN EX PADS: 1.0000 "application " | MEDICATED_PAD | CUTANEOUS | Status: DC | PRN

## 2019-08-22 MED ORDER — ACETAMINOPHEN 325 MG PO TABS: 650.0000 mg | ORAL_TABLET | ORAL | Status: DC | PRN

## 2019-08-22 MED ORDER — BUPIVACAINE HCL (PF) 0.25 % IJ SOLN
INTRAMUSCULAR | Status: DC | PRN
  Administered 2019-08-22 (×2): 4 mL via EPIDURAL

## 2019-08-22 MED ORDER — EPHEDRINE 5 MG/ML INJ: 10.0000 mg | INTRAVENOUS | Status: DC | PRN

## 2019-08-22 MED ORDER — LACTATED RINGERS IV SOLN
500.0000 mL | Freq: Once | INTRAVENOUS | Status: AC
  Administered 2019-08-22: 500 mL via INTRAVENOUS

## 2019-08-22 MED ORDER — COCONUT OIL OIL
1.0000 "application " | TOPICAL_OIL | Status: DC | PRN
  Administered 2019-08-22: 1 via TOPICAL
  Filled 2019-08-22: qty 120

## 2019-08-22 MED ORDER — TERBUTALINE SULFATE 1 MG/ML IJ SOLN: 0.2500 mg | Freq: Once | INTRAMUSCULAR | Status: DC | PRN

## 2019-08-22 MED ORDER — ONDANSETRON HCL 4 MG PO TABS: 4.0000 mg | ORAL_TABLET | ORAL | Status: DC | PRN

## 2019-08-22 MED ORDER — FENTANYL 2.5 MCG/ML W/ROPIVACAINE 0.15% IN NS 100 ML EPIDURAL (ARMC)
EPIDURAL | Status: AC
  Filled 2019-08-22: qty 100

## 2019-08-22 MED ORDER — PHENYLEPHRINE 40 MCG/ML (10ML) SYRINGE FOR IV PUSH (FOR BLOOD PRESSURE SUPPORT): 80.0000 ug | PREFILLED_SYRINGE | INTRAVENOUS | Status: DC | PRN

## 2019-08-22 MED ORDER — LIDOCAINE-EPINEPHRINE (PF) 1.5 %-1:200000 IJ SOLN
INTRAMUSCULAR | Status: DC | PRN
  Administered 2019-08-22: 3 mL via PERINEURAL

## 2019-08-22 MED ORDER — SOD CITRATE-CITRIC ACID 500-334 MG/5ML PO SOLN: 30.0000 mL | ORAL | Status: DC | PRN

## 2019-08-22 MED ORDER — DIBUCAINE (PERIANAL) 1 % EX OINT: 1.0000 "application " | TOPICAL_OINTMENT | CUTANEOUS | Status: DC | PRN

## 2019-08-22 MED ORDER — PENICILLIN G 3 MILLION UNITS IVPB - SIMPLE MED
3.0000 10*6.[IU] | INTRAVENOUS | Status: DC
  Administered 2019-08-22: 3 10*6.[IU] via INTRAVENOUS
  Filled 2019-08-22: qty 100

## 2019-08-22 MED ORDER — AMMONIA AROMATIC IN INHA
RESPIRATORY_TRACT | Status: AC
  Filled 2019-08-22: qty 10

## 2019-08-22 MED ORDER — DIPHENHYDRAMINE HCL 25 MG PO CAPS: 25.0000 mg | ORAL_CAPSULE | Freq: Four times a day (QID) | ORAL | Status: DC | PRN

## 2019-08-22 MED ORDER — BENZOCAINE-MENTHOL 20-0.5 % EX AERO
1.0000 "application " | INHALATION_SPRAY | CUTANEOUS | Status: DC | PRN
  Filled 2019-08-22: qty 56

## 2019-08-22 MED ORDER — SODIUM CHLORIDE 0.9 % IV SOLN
5.0000 10*6.[IU] | Freq: Once | INTRAVENOUS | Status: AC
  Administered 2019-08-22: 5 10*6.[IU] via INTRAVENOUS
  Filled 2019-08-22: qty 5

## 2019-08-22 MED ORDER — DOCUSATE SODIUM 100 MG PO CAPS
100.0000 mg | ORAL_CAPSULE | Freq: Two times a day (BID) | ORAL | Status: DC
  Administered 2019-08-23: 100 mg via ORAL
  Filled 2019-08-22 (×2): qty 1

## 2019-08-22 MED ORDER — LACTATED RINGERS IV SOLN
INTRAVENOUS | Status: DC
  Administered 2019-08-22: 06:00:00 via INTRAVENOUS

## 2019-08-22 MED ORDER — OXYTOCIN 10 UNIT/ML IJ SOLN
INTRAMUSCULAR | Status: AC
  Filled 2019-08-22: qty 2

## 2019-08-22 MED ORDER — FENTANYL 2.5 MCG/ML W/ROPIVACAINE 0.15% IN NS 100 ML EPIDURAL (ARMC)
12.0000 mL/h | EPIDURAL | Status: DC
  Administered 2019-08-22: 12 mL/h via EPIDURAL

## 2019-08-22 MED ORDER — IBUPROFEN 600 MG PO TABS
600.0000 mg | ORAL_TABLET | Freq: Four times a day (QID) | ORAL | Status: DC
  Administered 2019-08-22 – 2019-08-23 (×4): 600 mg via ORAL
  Filled 2019-08-22 (×5): qty 1

## 2019-08-22 MED ORDER — LACTATED RINGERS IV SOLN
500.0000 mL | INTRAVENOUS | Status: DC | PRN
  Administered 2019-08-22: 500 mL via INTRAVENOUS

## 2019-08-22 MED ORDER — ONDANSETRON HCL 4 MG/2ML IJ SOLN: 4.0000 mg | INTRAMUSCULAR | Status: DC | PRN

## 2019-08-22 MED ORDER — PRENATAL MULTIVITAMIN CH
1.0000 | ORAL_TABLET | Freq: Every day | ORAL | Status: DC
  Administered 2019-08-23: 13:00:00 1 via ORAL
  Filled 2019-08-22: qty 1

## 2019-08-22 MED ORDER — OXYTOCIN BOLUS FROM INFUSION
500.0000 mL | Freq: Once | INTRAVENOUS | Status: AC
  Administered 2019-08-22: 500 mL via INTRAVENOUS

## 2019-08-22 MED ORDER — BUTORPHANOL TARTRATE 1 MG/ML IJ SOLN
1.0000 mg | INTRAMUSCULAR | Status: DC | PRN
  Administered 2019-08-22: 1 mg via INTRAVENOUS
  Filled 2019-08-22 (×2): qty 1

## 2019-08-22 MED ORDER — LIDOCAINE HCL (PF) 1 % IJ SOLN: 30.0000 mL | INTRAMUSCULAR | Status: DC | PRN

## 2019-08-22 MED ORDER — OXYTOCIN 40 UNITS IN NORMAL SALINE INFUSION - SIMPLE MED
2.5000 [IU]/h | INTRAVENOUS | Status: DC
  Administered 2019-08-22: 2.5 [IU]/h via INTRAVENOUS
  Filled 2019-08-22: qty 1000

## 2019-08-22 MED ORDER — SIMETHICONE 80 MG PO CHEW: 80.0000 mg | CHEWABLE_TABLET | ORAL | Status: DC | PRN

## 2019-08-22 MED ORDER — LIDOCAINE HCL (PF) 1 % IJ SOLN
INTRAMUSCULAR | Status: AC
  Filled 2019-08-22: qty 30

## 2019-08-22 NOTE — Progress Notes (Signed)
Rolanda Campa Tobin Chad is a 23 y.o. G1P0 at [redacted]w[redacted]d by LMP admitted for active labor  Subjective: Rates pain a 9 on pain scale, mostly in her low back with contractions, nauseated with one episode of vomiting. Objective: BP 111/72 (BP Location: Right Arm)   Pulse 83   Temp 97.8 F (36.6 C) (Oral)   Resp 20   Ht 5\' 4"  (1.626 m)   Wt 96.2 kg   LMP 11/19/2018   BMI 36.39 kg/m  No intake/output data recorded. Total I/O In: -  Out: 100 [Emesis/NG output:100]  FHT:  FHR: 135 bpm, variability: moderate,  accelerations:  Present,  decelerations:  Absent UC:   regular, every 3-4 minutes, moderate to palption SVE:   Dilation: 5.5 Effacement (%): 80 Station: -1 Exam by:: [employer]: Lab Results  Component Value Date   WBC 14.4 (H) 08/22/2019   HGB 12.8 08/22/2019   HCT 37.5 08/22/2019   MCV 86.6 08/22/2019   PLT 290 08/22/2019    Assessment / Plan: Spontaneous labor, progressing normally  Labor: Progressing normally Preeclampsia:  labs stable Fetal Wellbeing:  Category I Pain Control:  IV pain meds, request epidural- anesthesia called. I/D:  n/a Anticipated MOD:  NSVD  Natesha Hassey N Elmond Poehlman 08/22/2019, 8:03 AM

## 2019-08-22 NOTE — OB Triage Note (Signed)
Patient came in for observation for labor evaluation. Patient reports uterine contractions every four minutes all throughout the night. Patient reports SROM at 0315 clear fluid and reports vaginal bleeding. Patient reports +FM upon arrival. Vital signs stable and patient afebrile. FHR baseline 135 with moderate variability with accelerations 15 x 15 and no decelerations. Husband at bedside. Will continue to monitor.

## 2019-08-22 NOTE — Progress Notes (Signed)
Valerie Garner is a 23 y.o. G1P0 at [redacted]w[redacted]d by LMP admitted for active labor  Subjective: Reports tightness noted in upper left abdomen since epidural placed  Objective: BP 114/69   Pulse 87   Temp 98 F (36.7 C) (Oral)   Resp 20   Ht 5\' 4"  (1.626 m)   Wt 96.2 kg   LMP 11/19/2018   SpO2 99%   BMI 36.39 kg/m  No intake/output data recorded. Total I/O In: -  Out: 100 [Emesis/NG output:100]  FHT:  FHR: 125 bpm, variability: moderate,  accelerations:  Present,  decelerations:  Absent UC:   irregular, every 2-4 minutes, moderate to palpation, IUPC placed SVE:   Dilation: 8 Effacement (%): 90 Station: -1, 0 Exam by:: [employer] CNM  Labs: Lab Results  Component Value Date   WBC 14.4 (H) 08/22/2019   HGB 12.8 08/22/2019   HCT 37.5 08/22/2019   MCV 86.6 08/22/2019   PLT 290 08/22/2019    Assessment / Plan: Spontaneous labor, progressing normally  Labor: Progressing normally Preeclampsia:  labs stable Fetal Wellbeing:  Category I Pain Control:  Epidural I/D:  n/a Anticipated MOD:  NSVD  Valerie Garner 08/22/2019, 12:30 PM

## 2019-08-22 NOTE — Anesthesia Procedure Notes (Signed)
Epidural Patient location during procedure: OB Start time: 08/22/2019 8:50 AM End time: 08/22/2019 8:52 AM  Staffing Anesthesiologist: Martha Clan, MD Resident/CRNA: Doreen Salvage, CRNA Performed: resident/CRNA   Preanesthetic Checklist Completed: patient identified, site marked, surgical consent, pre-op evaluation, timeout performed, IV checked, risks and benefits discussed and monitors and equipment checked  Epidural Patient position: sitting Prep: ChloraPrep Patient monitoring: heart rate, continuous pulse ox and blood pressure Approach: midline Location: L3-L4 Injection technique: LOR saline  Needle:  Needle type: Tuohy  Needle gauge: 17 G Needle length: 9 cm and 9 Needle insertion depth: 6 cm Catheter type: closed end flexible Catheter size: 19 Gauge Catheter at skin depth: 10 cm Test dose: negative and 1.5% lidocaine with Epi 1:200 K  Assessment Sensory level: T10 Events: blood not aspirated, injection not painful, no injection resistance, negative IV test and no paresthesia  Additional Notes 1 attempt Pt. Evaluated and documentation done after procedure finished. Patient identified. Risks/Benefits/Options discussed with patient including but not limited to bleeding, infection, nerve damage, paralysis, failed block, incomplete pain control, headache, blood pressure changes, nausea, vomiting, reactions to medication both or allergic, itching and postpartum back pain. Confirmed with bedside nurse the patient's most recent platelet count. Confirmed with patient that they are not currently taking any anticoagulation, have any bleeding history or any family history of bleeding disorders. Patient expressed understanding and wished to proceed. All questions were answered. Sterile technique was used throughout the entire procedure. Please see nursing notes for vital signs. Test dose was given through epidural catheter and negative prior to continuing to dose epidural or start  infusion. Warning signs of high block given to the patient including shortness of breath, tingling/numbness in hands, complete motor block, or any concerning symptoms with instructions to call for help. Patient was given instructions on fall risk and not to get out of bed. All questions and concerns addressed with instructions to call with any issues or inadequate analgesia.   Patient tolerated the insertion well without immediate complications.Reason for block:procedure for pain

## 2019-08-22 NOTE — H&P (Signed)
Obstetric History and Physical  Valerie Garner is a 23 y.o. G1P0 with IUP at [redacted]w[redacted]d presenting with SROM this am and irregular contractions. Patient states she has been having  irregular, every 3-5 minutes contractions, minimal vaginal bleeding, ruptured, clear fluid membranes, with active fetal movement.    Prenatal Course Source of Care: [facility]  Pregnancy complications or risks:BMI 38  Prenatal labs and studies: ABO, Rh: --/--/A POS (09/17 0456) Antibody: NEG (09/17 0456) Rubella: Immune (07/30 0000) RPR: Nonreactive (07/30 0000)  HBsAg: Negative (07/01 0000)  HIV: Non-reactive (07/30 0000)  GBS:--/Positive (09/03 1648) 1 hr Glucola  normal Genetic screening normal Anatomy US normal  History reviewed. No pertinent past medical history.  Past Surgical History:  Procedure Laterality Date  . adenoidectomy    . TONSILLECTOMY Bilateral     OB History  Gravida Para Term Preterm AB Living  1            SAB TAB Ectopic Multiple Live Births               # Outcome Date GA Lbr Len/2nd Weight Sex Delivery Anes PTL Lv  1 Current             Social History   Socioeconomic History  . Marital status: Married    Spouse name: Not on file  . Number of children: Not on file  . Years of education: Not on file  . Highest education level: Not on file  Occupational History  . Not on file  Social Needs  . Financial resource strain: Not on file  . Food insecurity    Worry: Not on file    Inability: Not on file  . Transportation needs    Medical: Not on file    Non-medical: Not on file  Tobacco Use  . Smoking status: Never Smoker  . Smokeless tobacco: Never Used  Substance and Sexual Activity  . Alcohol use: Not Currently  . Drug use: Never  . Sexual activity: Yes  Lifestyle  . Physical activity    Days per week: Not on file    Minutes per session: Not on file  . Stress: Not on file  Relationships  . Social Herbalist on phone: Not on file    Gets  together: Not on file    Attends religious service: Not on file    Active member of club or organization: Not on file    Attends meetings of clubs or organizations: Not on file    Relationship status: Not on file  Other Topics Concern  . Not on file  Social History Narrative  . Not on file    History reviewed. No pertinent family history.  Medications Prior to Admission  Medication Sig Dispense Refill Last Dose  . metoCLOPramide (REGLAN) 10 MG tablet Take 10 mg by mouth as needed.   Past Week at Unknown time  . ondansetron (ZOFRAN) 4 MG tablet Take 4 mg by mouth as needed.   Past Week at Unknown time  . Prenatal Vit-Fe Fumarate-FA (MULTIVITAMIN-PRENATAL) 27-0.8 MG TABS tablet Take 1 tablet by mouth daily at 12 noon.   08/21/2019 at Unknown time  . zolpidem (AMBIEN) 5 MG tablet Take 1 tablet (5 mg total) by mouth at bedtime as needed for sleep. (Patient not taking: Reported on 08/22/2019) 14 tablet 0 Not Taking at Unknown time    No Known Allergies  Review of Systems: Negative except for what is mentioned in HPI.  Physical  Exam: BP 111/72 (BP Location: Right Arm)   Pulse 83   Temp 97.8 F (36.6 C) (Oral)   Resp 20   Ht 5\' 4"  (1.626 m)   Wt 96.2 kg   LMP 11/19/2018   BMI 36.39 kg/m  GENERAL: Well-developed, well-nourished female in no acute distress.  LUNGS: Clear to auscultation bilaterally.  HEART: Regular rate and rhythm. ABDOMEN: Soft, nontender, nondistended, gravid. EXTREMITIES: Nontender, no edema, 2+ distal pulses. Cervical Exam: Dilation: 5.5 Effacement (%): 80 Cervical Position: Middle Station: -1 Presentation: Vertex Exam by:: Karris Deangelo FHT:  Baseline rate 135 bpm   Variability moderate  Accelerations present   Decelerations none Contractions: Every 3-5 mins   Pertinent Labs/Studies:   Results for orders placed or performed during the hospital encounter of 08/22/19 (from the past 24 hour(s))  Type and screen [facility]     Status: None    Collection Time: 08/22/19  4:56 AM  Result Value Ref Range   ABO/RH(D) A POS    Antibody Screen NEG    Sample Expiration      08/25/2019,2359 Performed at [facility], [address] [place], [place] [address]   CBC     Status: Abnormal   Collection Time: 08/22/19  4:56 AM  Result Value Ref Range   WBC 14.4 (H) 4.0 - 10.5 K/uL   RBC 4.33 3.87 - 5.11 MIL/uL   Hemoglobin 12.8 12.0 - 15.0 g/dL   HCT 60.437.5 54.036.0 - 98.146.0 %   MCV 86.6 80.0 - 100.0 fL   MCH 29.6 26.0 - 34.0 pg   MCHC 34.1 30.0 - 36.0 g/dL   RDW 19.112.5 47.811.5 - 29.515.5 %   Platelets 290 150 - 400 K/uL   nRBC 0.0 0.0 - 0.2 %  SARS Coronavirus 2 [facility](Hospital order, Performed in [facility]) Nasopharyngeal Nasopharyngeal Swab     Status: None   Collection Time: 08/22/19  4:56 AM   Specimen: Nasopharyngeal Swab  Result Value Ref Range   SARS Coronavirus 2 NEGATIVE NEGATIVE    Assessment : Mertha FindersJessica Brooke St Jeoffrey MassedGermain is a 23 y.o. G1P0 at 1148w4d being admitted for labor.  Plan: Labor: Expectant management.  Induction/Augmentation as needed, per protocol FWB: Reassuring fetal heart tracing.  GBS positive Delivery plan: Hopeful for vaginal delivery  Mikaele Stecher, CNM [facility]

## 2019-08-22 NOTE — Lactation Note (Signed)
This note was copied from a baby's chart. Lactation Consultation Note  Patient Name: Valerie Garner Today's Date: 08/22/2019 Reason for consult: Initial assessment LC called to assist with first breastfeeding for mom Valerie Garner and baby Valerie Garner. Baby was in skin to skin position and showing early hunger cues. Assisted mom with moving baby into cradle/cross-cradle position. When nipple and surrounding tissue were held, nipple appeared to flatten slightly; Valerie Garner needed assistance through continuous tissue hold to sustain latch at first. Once latch establish Valerie Garner was observed to have flanged top and bottom lips with good wide open mouth and making rhythmic sucking, audible swallows not heard at this time. After several minutes LC did let go of breast tissue and Valerie Garner remained that the breast.  LC demonstrated hand expression on opposite breast, where nipple rolling/stimulation did cause nipple to harden and evert better. Colostrum was present with hand expression.  LC continued to assist with latching Valerie Garner onto left breast by holding tissue until rhythmic sucking pattern was established, for a duration of approximately 25 minutes. [place] educated parents on newborn stomach size and feeding patterns, early hunger cues, wet/stool diapers. Discussed achieving optimal latch with wide mouth/flanged lips, and prevention of pinching. Encouraged on demand feeds, and frequent skin to skin, reviewing benefits for both mom and baby.  Transition RN updated with Valerie Garner's breastfeed.   Maternal Data Formula Feeding for Exclusion: No Has patient been taught Hand Expression?: Yes Does the patient have breastfeeding experience prior to this delivery?: No  Feeding Feeding Type: Breast Fed  LATCH Score Latch: Repeated attempts needed to sustain latch, nipple held in mouth throughout feeding, stimulation needed to elicit sucking reflex.  Audible Swallowing: A few with stimulation  Type of Nipple: Everted at  rest and after stimulation(aerolar folds around nipple when touched; hold needed )  Comfort (Breast/Nipple): Soft / non-tender  Hold (Positioning): Assistance needed to correctly position infant at breast and maintain latch.  LATCH Score: 7  Interventions Interventions: Breast feeding basics reviewed;Assisted with latch;Skin to skin;Support pillows;Adjust position  Lactation Tools Discussed/Used     Consult Status Consult Status: Follow-up Date: 08/22/19 Follow-up type: In-patient    Lavonia Drafts 08/22/2019, 4:19 PM

## 2019-08-22 NOTE — Discharge Instructions (Signed)

## 2019-08-22 NOTE — Anesthesia Preprocedure Evaluation (Signed)
Anesthesia Evaluation  Patient identified by MRN, date of birth, ID band Patient awake    Reviewed: Allergy & Precautions, H&P , NPO status , Patient's Chart, lab work & pertinent test results  History of Anesthesia Complications Negative for: history of anesthetic complications  Airway Mallampati: II  TM Distance: >3 FB Neck ROM: full    Dental no notable dental hx.    Pulmonary neg pulmonary ROS,    Pulmonary exam normal        Cardiovascular negative cardio ROS Normal cardiovascular exam     Neuro/Psych negative neurological ROS  negative psych ROS   GI/Hepatic negative GI ROS, Neg liver ROS,   Endo/Other  negative endocrine ROS  Renal/GU negative Renal ROS  negative genitourinary   Musculoskeletal   Abdominal   Peds  Hematology negative hematology ROS (+)   Anesthesia Other Findings   Reproductive/Obstetrics (+) Pregnancy                             Anesthesia Physical Anesthesia Plan  ASA: III  Anesthesia Plan: Epidural   Post-op Pain Management:    Induction:   PONV Risk Score and Plan:   Airway Management Planned:   Additional Equipment:   Intra-op Plan:   Post-operative Plan:   Informed Consent: I have reviewed the patients History and Physical, chart, labs and discussed the procedure including the risks, benefits and alternatives for the proposed anesthesia with the patient or authorized representative who has indicated his/her understanding and acceptance.       Plan Discussed with: Anesthesiologist  Anesthesia Plan Comments:         Anesthesia Quick Evaluation

## 2019-08-22 NOTE — Progress Notes (Signed)
Valerie Garner is a 23 y.o. G1P0 at [redacted]w[redacted]d by LMP admitted for active labor, rupture of membranes  Subjective: Denies pain with contractions Objective: BP 114/69   Pulse 87   Temp 98 F (36.7 C) (Oral)   Resp 20   Ht 5\' 4"  (1.626 m)   Wt 96.2 kg   LMP 11/19/2018   SpO2 99%   BMI 36.39 kg/m  No intake/output data recorded. Total I/O In: -  Out: 400 [Urine:300; Emesis/NG output:100]  FHT:  FHR: 126 bpm, variability: moderate,  accelerations:  Present,  decelerations:  Absent UC:   regular, every 2 minutes SVE:   Dilation: 10 Effacement (%): 100 Station: Plus 1, Plus 2 Exam by:: [place] CNM  Labs: Lab Results  Component Value Date   WBC 14.4 (H) 08/22/2019   HGB 12.8 08/22/2019   HCT 37.5 08/22/2019   MCV 86.6 08/22/2019   PLT 290 08/22/2019    Assessment / Plan: Augmentation of labor, progressing well, will begin pushing  Labor: Progressing normally Preeclampsia:  labs stable Fetal Wellbeing:  Category I Pain Control:  Epidural I/D:  n/a Anticipated MOD:  NSVD  Melody N Shambley 08/22/2019, 2:12 PM

## 2019-08-23 LAB — CBC
HCT: 32.6 % — ABNORMAL LOW (ref 36.0–46.0)
Hemoglobin: 10.9 g/dL — ABNORMAL LOW (ref 12.0–15.0)
MCH: 29.7 pg (ref 26.0–34.0)
MCHC: 33.4 g/dL (ref 30.0–36.0)
MCV: 88.8 fL (ref 80.0–100.0)
Platelets: 245 10*3/uL (ref 150–400)
RBC: 3.67 MIL/uL — ABNORMAL LOW (ref 3.87–5.11)
RDW: 12.9 % (ref 11.5–15.5)
WBC: 12.8 10*3/uL — ABNORMAL HIGH (ref 4.0–10.5)
nRBC: 0 % (ref 0.0–0.2)

## 2019-08-23 MED ORDER — VITAMIN D3 125 MCG (5000 UT) PO CAPS: 1.0000 | ORAL_CAPSULE | Freq: Every day | ORAL | 2 refills | Status: DC

## 2019-08-23 NOTE — Anesthesia Postprocedure Evaluation (Signed)
Anesthesia Post Note  Patient: Jonai Weyland [facility]  Procedure(s) Performed: AN AD [place]  Patient location during evaluation: Women's Unit Anesthesia Type: Epidural Level of consciousness: awake, awake and alert, oriented and patient cooperative Pain management: pain level controlled Vital Signs Assessment: post-procedure vital signs reviewed and stable Respiratory status: spontaneous breathing, nonlabored ventilation and respiratory function stable Cardiovascular status: stable and blood pressure returned to baseline Postop Assessment: no headache, no backache, patient able to bend at knees, adequate PO intake, able to ambulate and no apparent nausea or vomiting Anesthetic complications: no     Last Vitals:  Vitals:   08/23/19 0400 08/23/19 0659  BP: 110/69 108/72  Pulse: 70 66  Resp: 16 18  Temp: 36.7 C (!) 36.3 C  SpO2: 99% 99%    Last Pain:  Vitals:   08/23/19 0659  TempSrc: Oral  PainSc:                  Ricki Miller

## 2019-08-23 NOTE — Lactation Note (Signed)
This note was copied from a baby's chart. Lactation Consultation Note  Patient Name: Valerie Garner OEHOZ'Y Date: 08/23/2019 Reason for consult: Follow-up assessment;[place] Mom given tips for assisting with deeper latch for baby   Maternal Data Formula Feeding for Exclusion: No Has patient been taught Hand Expression?: Yes Does the patient have breastfeeding experience prior to this delivery?: No  Feeding Feeding Type: Breast Fed Audible swallows LATCH Score Latch: Grasps breast easily, tongue down, lips flanged, rhythmical sucking.  Audible Swallowing: Spontaneous and intermittent  Type of Nipple: Everted at rest and after stimulation  Comfort (Breast/Nipple): Soft / non-tender  Hold (Positioning): Assistance needed to correctly position infant at breast and maintain latch.  LATCH Score: 9  Interventions Interventions: Assisted with latch;Skin to skin;Hand express;Breast compression;Support pillows  Lactation Tools Discussed/Used WIC Program: No   Consult Status Consult Status: PRN Date: 08/23/19 Follow-up type: In-patient    Ferol Luz 08/23/2019, 12:05 PM

## 2019-08-23 NOTE — Progress Notes (Signed)
Patient discharged home with infant. Discharge instructions, prescriptions and follow up appointment given to and reviewed with patient. Patient verbalized understanding. Patient wheeled out with infant by RN 

## 2019-08-23 NOTE — Discharge Summary (Signed)
Physician Obstetric Discharge Summary  Patient ID: Valerie Garner MRN: [id number] DOB/AGE: 1996/01/10 23 y.o.   Date of Admission: 08/22/2019  Date of Discharge:   Admitting Diagnosis: Onset of Labor at [redacted]w[redacted]d  Secondary Diagnosis: none  Mode of Delivery: normal spontaneous vaginal delivery     Discharge Diagnosis: SVD   Intrapartum Procedures: GBS prophylaxis   Post partum procedures: none  Complications: superficial 1st degree perineal laceration   [name] is a [id number] who had a SVD on 08/22/2019;  for further details of this, please refer to the delivey note.  Patient had an uncomplicated postpartum course.  By time of discharge on PPD#1, her pain was controlled on oral pain medications; she had appropriate lochia and was ambulating, voiding without difficulty and tolerating regular diet.  She was deemed stable for discharge to home.    Labs: CBC Latest Ref Rng & Units 08/23/2019 08/22/2019 07/04/2019  WBC 4.0 - 10.5 K/uL 12.8(H) 14.4(H) -  Hemoglobin 12.0 - 15.0 g/dL 10.9(L) 12.8 -  Hematocrit 36.0 - 46.0 % 32.6(L) 37.5 -  Platelets 150 - 400 K/uL 245 290 360   A POS  Physical exam:  Blood pressure 108/72, pulse 66, temperature (!) 97.4 F (36.3 C), temperature source Oral, resp. rate 18, height 5\' 4"  (1.626 m), weight 96.2 kg, last menstrual period 11/19/2018, SpO2 99 %, unknown if currently breastfeeding. General: alert and no distress Lochia: appropriate Abdomen: soft, NT Uterine Fundus: firm  Extremities: No evidence of DVT seen on physical exam. mild lower extremity edema.  Discharge Instructions: Per After Visit Summary. Activity: Advance as tolerated. Pelvic rest for 6 weeks.  Also refer to After Visit Summary Diet: Regular Medications: Allergies as of 08/23/2019   No Known Allergies     Medication List    STOP taking these medications   metoCLOPramide 10 MG tablet Commonly known as: REGLAN    ondansetron 4 MG tablet Commonly known as: ZOFRAN   zolpidem 5 MG tablet Commonly known as: AMBIEN     TAKE these medications   multivitamin-prenatal 27-0.8 MG Tabs tablet Take 1 tablet by mouth daily at 12 noon.   Vitamin D3 125 MCG (5000 UT) Caps Take 1 capsule (5,000 Units total) by mouth daily.      Outpatient follow up:  Postpartum contraception: nexplanon  Discharged Condition: good  Discharged to: home   Newborn Data: Disposition:home with mother  Apgars: APGAR (1 MIN): 8   APGAR (5 MINS): 9   APGAR (10 MINS):    Baby Feeding: Breast  Mar Zettler Rockney Ghee, CNM

## 2019-08-27 ENCOUNTER — Encounter: Payer: 59 | Admitting: Certified Nurse Midwife

## 2019-09-10 ENCOUNTER — Other Ambulatory Visit: Payer: Self-pay | Admitting: Obstetrics and Gynecology

## 2019-09-10 MED ORDER — POLYETHYLENE GLYCOL 3350 17 GM/SCOOP PO POWD: 1.0000 | Freq: Once | ORAL | 0 refills | Status: AC

## 2019-09-10 MED ORDER — MALATHION 0.5 % EX LOTN: TOPICAL_LOTION | Freq: Once | CUTANEOUS | 0 refills | Status: AC

## 2019-10-09 ENCOUNTER — Encounter: Payer: Self-pay | Admitting: Obstetrics and Gynecology

## 2019-10-09 ENCOUNTER — Other Ambulatory Visit: Payer: Self-pay

## 2019-10-09 ENCOUNTER — Ambulatory Visit (INDEPENDENT_AMBULATORY_CARE_PROVIDER_SITE_OTHER): Payer: 59 | Admitting: Obstetrics and Gynecology

## 2019-10-09 DIAGNOSIS — Z1389 Encounter for screening for other disorder: Secondary | ICD-10-CM

## 2019-10-09 DIAGNOSIS — Z3046 Encounter for surveillance of implantable subdermal contraceptive: Secondary | ICD-10-CM

## 2019-10-09 MED ORDER — ETONOGESTREL 68 MG ~~LOC~~ IMPL: 68.0000 mg | DRUG_IMPLANT | Freq: Once | SUBCUTANEOUS | Status: DC

## 2019-10-09 NOTE — Progress Notes (Signed)
Subjective:     Valerie Garner is a 23 y.o. female Valerie Garner is a 23 y.o. female who presents for a postpartum visit. She is 6 weeks postpartum following a spontaneous vaginal delivery. I have fully reviewed the prenatal and intrapartum course. The delivery was at 32 gestational weeks. Outcome: spontaneous vaginal delivery. Anesthesia: epidural. Postpartum course has been uncomplicated. Baby's course has been uncomplicated. Baby is feeding by formula for the last 3 weeks. Bleeding no bleeding. Bowel function is normal. Bladder function is normal. Patient is not sexually active. Contraception method is abstinence. Postpartum depression screening: negative.  The following portions of the patient's history were reviewed and updated as appropriate: allergies, current medications, past family history, past medical history, past social history, past surgical history and problem list.  Review of Systems A comprehensive review of systems was negative.   Objective:    BP 124/74   Pulse 83   Ht 5\' 4"  (1.626 m)   Wt 210 lb 3.2 oz (95.3 kg)   LMP  (LMP Unknown)   Breastfeeding No   BMI 36.08 kg/m   General:  alert, cooperative, appears stated age and moderately obese   Breasts:  inspection negative, no nipple discharge or bleeding, no masses or nodularity palpable  Lungs: clear to auscultation bilaterally  Heart:  regular rate and rhythm, S1, S2 normal, no murmur, click, rub or gallop  Abdomen: soft, non-tender; bowel sounds normal; no masses,  no organomegaly   Vulva:  normal  Vagina: normal vagina, no discharge, exudate, lesion, or erythema  Cervix:  multiparous appearance  Corpus: normal size, contour, position, consistency, mobility, non-tender  Adnexa:  no mass, fullness, tenderness  Rectal Exam: Not performed.        Assessment:     6 weeks postpartum exam.  Nexplanon insertion  Plan:    1. Contraception: Nexplanon-see insertion note below. 2. Labs obtained-will follow up accordingly 3. Follow up  in: 3 months for AE or as needed.     Valerie Garner is a 23 y.o. year old Caucasian female here for Nexplanon insertion.  No LMP recorded (lmp unknown)., last sexual intercourse was before delivery of infant 7 weeks ago, and her pregnancy test today was negative.  Risks/benefits/side effects of Nexplanon have been discussed and her questions have been answered.  Specifically, a failure rate of 12/998 has been reported, with an increased failure rate if pt takes Shubuta and/or antiseizure medicaitons.  Valerie Garner is aware of the common side effect of irregular bleeding, which the incidence of decreases over time.  BP 124/74   Pulse 83   Ht 5\' 4"  (1.626 m)   Wt 210 lb 3.2 oz (95.3 kg)   LMP  (LMP Unknown)   Breastfeeding No   BMI 36.08 kg/m   No results found for this or any previous visit (from the past 24 hour(s)).   She is right-handed, so her left arm, approximately 4 inches proximal from the elbow, was cleansed with alcohol and anesthetized with 2cc of 2% Lidocaine.  The area was cleansed again with betadine and the Nexplanon was inserted per manufacturer's recommendations without difficulty.  A steri-strip and pressure bandage were applied.  Pt was instructed to keep the area clean and dry, remove pressure bandage in 24 hours, and keep insertion site covered with the steri-strip for 3-5 days.  Back up contraception was recommended for 2 weeks.  She was given a card indicating date Nexplanon was inserted and date it needs to be removed. Follow-up  PRN problems.  Valerie Garner,CNM

## 2019-10-09 NOTE — Patient Instructions (Signed)
  Place postpartum visit patient instructions here.  

## 2019-10-09 NOTE — Progress Notes (Signed)
Patient here for postpartum care, no complaints. LPS in [place] ~09/2018 (-) per patient report.

## 2019-10-10 ENCOUNTER — Telehealth: Payer: Self-pay

## 2019-10-10 ENCOUNTER — Other Ambulatory Visit: Payer: Self-pay | Admitting: Obstetrics and Gynecology

## 2019-10-10 DIAGNOSIS — E559 Vitamin D deficiency, unspecified: Secondary | ICD-10-CM

## 2019-10-10 LAB — FERRITIN: Ferritin: 27 ng/mL (ref 15–150)

## 2019-10-10 LAB — CBC
Hematocrit: 39.5 % (ref 34.0–46.6)
Hemoglobin: 13.1 g/dL (ref 11.1–15.9)
MCH: 29.6 pg (ref 26.6–33.0)
MCHC: 33.2 g/dL (ref 31.5–35.7)
MCV: 89 fL (ref 79–97)
Platelets: 345 10*3/uL (ref 150–450)
RBC: 4.43 x10E6/uL (ref 3.77–5.28)
RDW: 13 % (ref 11.7–15.4)
WBC: 7.4 10*3/uL (ref 3.4–10.8)

## 2019-10-10 LAB — VITAMIN D 25 HYDROXY (VIT D DEFICIENCY, FRACTURES): Vit D, 25-Hydroxy: 26.6 ng/mL — ABNORMAL LOW (ref 30.0–100.0)

## 2019-10-10 MED ORDER — VITAMIN D (ERGOCALCIFEROL) 1.25 MG (50000 UNIT) PO CAPS: 50000.0000 [IU] | ORAL_CAPSULE | ORAL | 1 refills | Status: DC

## 2019-10-10 NOTE — Telephone Encounter (Signed)
Information on Vit D Def. Mailed to patient per MS request.

## 2019-10-30 ENCOUNTER — Other Ambulatory Visit: Payer: Self-pay | Admitting: Certified Nurse Midwife

## 2019-10-30 MED ORDER — SERTRALINE HCL 50 MG PO TABS: 50.0000 mg | ORAL_TABLET | Freq: Every day | ORAL | 1 refills | Status: DC

## 2019-10-30 NOTE — Progress Notes (Signed)
Orders placed for zoloft for moderated depression and mild anxiety postpartum. Pt to follow up 4-6 wks for medication check.   Philip Aspen, CNM

## 2020-01-24 ENCOUNTER — Encounter: Payer: No Typology Code available for payment source | Admitting: Certified Nurse Midwife

## 2020-01-24 ENCOUNTER — Encounter: Payer: 59 | Admitting: Certified Nurse Midwife

## 2020-04-16 ENCOUNTER — Other Ambulatory Visit: Payer: Self-pay

## 2020-04-16 ENCOUNTER — Encounter: Payer: Self-pay | Admitting: Certified Nurse Midwife

## 2020-04-16 ENCOUNTER — Ambulatory Visit (INDEPENDENT_AMBULATORY_CARE_PROVIDER_SITE_OTHER): Payer: 59 | Admitting: Certified Nurse Midwife

## 2020-04-16 VITALS — BP 138/66 | HR 93 | Ht 64.0 in | Wt 242.8 lb

## 2020-04-16 DIAGNOSIS — Z3046 Encounter for surveillance of implantable subdermal contraceptive: Secondary | ICD-10-CM

## 2020-04-16 DIAGNOSIS — Z3009 Encounter for other general counseling and advice on contraception: Secondary | ICD-10-CM

## 2020-04-16 NOTE — Progress Notes (Signed)
Valerie Garner is a 24 y.o. year old Caucasian female here for Nexplanon removal.  Patient given informed consent for removal of her Nexplanon.  BP 138/66   Pulse 93   Ht 5\' 4"  (1.626 m)   Wt 242 lb 12.8 oz (110.1 kg)   LMP  (LMP Unknown)   BMI 41.68 kg/m   Appropriate time out taken. Implanon site identified.  Area prepped in usual sterile fashon. One cc of 2% lidocaine was used to anesthetize the area at the distal end of the implant. A small stab incision was made right beside the implant on the distal portion.  The Nexplanon rod was grasped using hemostats and removed without difficulty.  There was less than 3 cc blood loss. There were no complications.  Steri-strips were applied over the small incision and a pressure bandage was applied.  The patient tolerated the procedure well.  She was instructed to keep the area clean and dry, remove pressure bandage in 24 hours, and keep insertion site covered with the steri-strip for 3-5 days.    Reviewed all forms of birth control options available including abstinence; fertility period awareness methods; over the counter/barrier methods; hormonal contraceptive medication including pill, patch, ring, injection,contraceptive implant; hormonal and nonhormonal IUDs; permanent sterilization options including vasectomy and the various tubal sterilization modalities. Risks and benefits reviewed.  Questions were answered.  Information was given to patient to review.   Samples of Lo Loestrin given with coupon.   Reviewed red flag symptoms and when to call.   RTC x 11 months for ANNUAL EXAM and PAP or sooner if needed.    , CNM [facility], [facility] 04/16/20 4:48 PM

## 2020-04-16 NOTE — Patient Instructions (Signed)
Ethinyl Estradiol; Norethindrone Acetate; Ferrous fumarate tablets or capsules What is this medicine? ETHINYL ESTRADIOL; NORETHINDRONE ACETATE; FERROUS FUMARATE (ETH in il es tra DYE ole; nor eth IN drone AS e tate; FER [place] FUE ma rate) is an oral contraceptive. The products combine two types of female hormones, an estrogen and a progestin. They are used to prevent ovulation and pregnancy. Some products are also used to treat acne in females. This medicine may be used for other purposes; ask your health care provider or pharmacist if you have questions. COMMON BRAND NAME(S): Aurovela [address] 1/20, Aurovela Fe, Bl[place]ovi [address] Fe, Estrostep Fe, Gildess 24 Fe, Gildess Fe 1.5/30, Gildess Fe 1/20, Hailey 24 Fe, Hailey Fe 1.5/30, Junel Fe 1.5/30, Junel Fe 1/20, Junel Fe 24, Larin Fe, Lo Loestrin Fe, Loestrin 24 Fe, Loestrin FE 1.5/30, Loestrin FE 1/20, Lomedia 24 Fe, Microgestin 24 Fe, Microgestin Fe 1.5/30, Microgestin Fe 1/20, Tarina 24 Fe, Tarina Fe 1/20, Taytulla, Tilia Fe, Tri-Legest Fe What should I tell my health care provider before I take this medicine? They need to know if you have any of these conditions:  abnormal vaginal bleeding  blood vessel disease  breast, cervical, endometrial, ovarian, liver, or uterine cancer  diabetes  gallbladder disease  heart disease or recent heart attack  high blood pressure  high cholesterol  history of blood clots  kidney disease  liver disease  migraine headaches  smoke tobacco  stroke  systemic lupus erythematosus (SLE)  an unusual or allergic reaction to estrogens, progestins, other medicines, foods, dyes, or preservatives  pregnant or trying to get pregnant  breast-feeding How should I use this medicine? Take this medicine by mouth. To reduce nausea, this medicine may be taken with food. Follow the directions on the prescription label. Take this medicine at the same time each day and in the order directed on the package. Do  not take your medicine more often than directed. A patient package insert for the product will be given with each prescription and refill. Read this sheet carefully each time. The sheet may change frequently. Contact your pediatrician regarding the use of this medicine in children. Special care may be needed. This medicine has been used in female children who have started having menstrual periods. Overdosage: If you think you have taken too much of this medicine contact a poison control center or emergency room at once. NOTE: This medicine is only for you. Do not share this medicine with others. What if I miss a dose? If you miss a dose, refer to the patient information sheet you received with your medicine for direction. If you miss more than one pill, this medicine may not be as effective and you may need to use another form of birth control. What may interact with this medicine? Do not take this medicine with the following medication:  dasabuvir; ombitasvir; paritaprevir; ritonavir  ombitasvir; paritaprevir; ritonavir This medicine may also interact with the following medications:  acetaminophen  antibiotics or medicines for infections, especially rifampin, rifabutin, rifapentine, and griseofulvin, and possibly penicillins or tetracyclines  aprepitant  ascorbic acid (vitamin C)  atorvastatin  barbiturate medicines, such as phenobarbital  bosentan  carbamazepine  caffeine  clofibrate  cyclosporine  dantrolene  doxercalciferol  felbamate  grapefruit juice  hydrocortisone  medicines for anxiety or sleeping problems, such as diazepam or temazepam  medicines for diabetes, including pioglitazone  mineral oil  modafinil  mycophenolate  nefazodone  oxcarbazepine  phenytoin  prednisolone  ritonavir or other medicines for  HIV infection or AIDS  rosuvastatin  selegiline  soy isoflavones supplements  St. John's wort  tamoxifen or  raloxifene  theophylline  thyroid hormones  topiramate  warfarin This list may not describe all possible interactions. Give your health care provider a list of all the medicines, herbs, non-prescription drugs, or dietary supplements you use. Also tell them if you smoke, drink alcohol, or use illegal drugs. Some items may interact with your medicine. What should I watch for while using this medicine? Visit your doctor or health care professional for regular checks on your progress. You will need a regular breast and pelvic exam and Pap smear while on this medicine. Use an additional method of contraception during the first cycle that you take these tablets. If you have any reason to think you are pregnant, stop taking this medicine right away and contact your doctor or health care professional. If you are taking this medicine for hormone related problems, it may take several cycles of use to see improvement in your condition. Smoking increases the risk of getting a blood clot or having a stroke while you are taking birth control pills, especially if you are more than 23 years old. You are strongly advised not to smoke. This medicine can make your body retain fluid, making your fingers, hands, or ankles swell. Your blood pressure can go up. Contact your doctor or health care professional if you feel you are retaining fluid. This medicine can make you more sensitive to the sun. Keep out of the sun. If you cannot avoid being in the sun, wear protective clothing and use sunscreen. Do not use sun lamps or tanning beds/booths. If you wear contact lenses and notice visual changes, or if the lenses begin to feel uncomfortable, consult your eye care specialist. In some women, tenderness, swelling, or minor bleeding of the gums may occur. Notify your dentist if this happens. Brushing and flossing your teeth regularly may help limit this. See your dentist regularly and inform your dentist of the medicines you  are taking. If you are going to have elective surgery, you may need to stop taking this medicine before the surgery. Consult your health care professional for advice. This medicine does not protect you against HIV infection (AIDS) or any other sexually transmitted diseases. What side effects may I notice from receiving this medicine? Side effects that you should report to your doctor or health care professional as soon as possible:  allergic reactions like skin rash, itching or hives, swelling of the face, lips, or tongue  breast tissue changes or discharge  changes in vaginal bleeding during your period or between your periods  changes in vision  chest pain  confusion  coughing up blood  dizziness  feeling faint or lightheaded  headaches or migraines  leg, arm or groin pain  loss of balance or coordination  severe or sudden headaches  stomach pain (severe)  sudden shortness of breath  sudden numbness or weakness of the face, arm or leg  symptoms of vaginal infection like itching, irritation or unusual discharge  tenderness in the upper abdomen  trouble speaking or understanding  vomiting  yellowing of the eyes or skin Side effects that usually do not require medical attention (report to your doctor or health care professional if they continue or are bothersome):  breakthrough bleeding and spotting that continues beyond the 3 initial cycles of pills  breast tenderness  mood changes, anxiety, depression, frustration, anger, or emotional outbursts  increased sensitivity to sun  or ultraviolet light  nausea  skin rash, acne, or brown spots on the skin  weight gain (slight) This list may not describe all possible side effects. Call your doctor for medical advice about side effects. You may report side effects to FDA at 1-800-FDA-1088. Where should I keep my medicine? Keep out of the reach of children. Store at room temperature between 15 and 30 degrees C  (59 and 86 degrees F). Throw away any unused medicine after the expiration date. NOTE: This sheet is a summary. It may not cover all possible information. If you have questions about this medicine, talk to your doctor, pharmacist, or health care provider.  2020 Elsevier/Gold Standard (2016-08-01 08:04:41)   Preventive Care 21-39 Years Old, Female Preventive care refers to visits with your health care provider and lifestyle choices that can promote health and wellness. This includes:  A yearly physical exam. This may also be called an annual well check.  Regular dental visits and eye exams.  Immunizations.  Screening for certain conditions.  Healthy lifestyle choices, such as eating a healthy diet, getting regular exercise, not using drugs or products that contain nicotine and tobacco, and limiting alcohol use. What can I expect for my preventive care visit? Physical exam Your health care provider will check your:  Height and weight. This may be used to calculate body mass index (BMI), which tells if you are at a healthy weight.  Heart rate and blood pressure.  Skin for abnormal spots. Counseling Your health care provider may ask you questions about your:  Alcohol, tobacco, and drug use.  Emotional well-being.  Home and relationship well-being.  Sexual activity.  Eating habits.  Work and work environment.  Method of birth control.  Menstrual cycle.  Pregnancy history. What immunizations do I need?  Influenza (flu) vaccine  This is recommended every year. Tetanus, diphtheria, and pertussis (Tdap) vaccine  You may need a Td booster every 10 years. Varicella (chickenpox) vaccine  You may need this if you have not been vaccinated. Human papillomavirus (HPV) vaccine  If recommended by your health care provider, you may need three doses over 6 months. Measles, mumps, and rubella (MMR) vaccine  You may need at least one dose of MMR. You may also need a second  dose. Meningococcal conjugate (MenACWY) vaccine  One dose is recommended if you are age 19-21 years and a first-year college student living in a residence hall, or if you have one of several medical conditions. You may also need additional booster doses. Pneumococcal conjugate (PCV13) vaccine  You may need this if you have certain conditions and were not previously vaccinated. Pneumococcal polysaccharide (PPSV23) vaccine  You may need one or two doses if you smoke cigarettes or if you have certain conditions. Hepatitis A vaccine  You may need this if you have certain conditions or if you travel or work in places where you may be exposed to hepatitis A. Hepatitis B vaccine  You may need this if you have certain conditions or if you travel or work in places where you may be exposed to hepatitis B. Haemophilus influenzae type b (Hib) vaccine  You may need this if you have certain conditions. You may receive vaccines as individual doses or as more than one vaccine together in one shot (combination vaccines). Talk with your health care provider about the risks and benefits of combination vaccines. What tests do I need?  Blood tests  Lipid and cholesterol levels. These may be checked every 5 years   starting at age 61.  Hepatitis C test.  Hepatitis B test. Screening  Diabetes screening. This is done by checking your blood sugar (glucose) after you have not eaten for a while (fasting).  Sexually transmitted disease (STD) testing.  BRCA-related cancer screening. This may be done if you have a family history of breast, ovarian, tubal, or peritoneal cancers.  Pelvic exam and Pap test. This may be done every 3 years starting at age 65. Starting at age 41, this may be done every 5 years if you have a Pap test in combination with an HPV test. Talk with your health care provider about your test results, treatment options, and if necessary, the need for more tests. Follow these instructions at  home: Eating and drinking   Eat a diet that includes fresh fruits and vegetables, whole grains, lean protein, and low-fat dairy.  Take vitamin and mineral supplements as recommended by your health care provider.  Do not drink alcohol if: ? Your health care provider tells you not to drink. ? You are pregnant, may be pregnant, or are planning to become pregnant.  If you drink alcohol: ? Limit how much you have to 0-1 drink a day. ? Be aware of how much alcohol is in your drink. In the [place] one drink equals one 12 oz bottle of beer (355 mL), one 5 oz glass of wine (148 mL), or one 1 oz glass of hard liquor (44 mL). Lifestyle  Take daily care of your teeth and gums.  Stay active. Exercise for at least 30 minutes on 5 or more days each week.  Do not use any products that contain nicotine or tobacco, such as cigarettes, e-cigarettes, and chewing tobacco. If you need help quitting, ask your health care provider.  If you are sexually active, practice safe sex. Use a condom or other form of birth control (contraception) in order to prevent pregnancy and STIs (sexually transmitted infections). If you plan to become pregnant, see your health care provider for a preconception visit. What's next?  Visit your health care provider once a year for a well check visit.  Ask your health care provider how often you should have your eyes and teeth checked.  Stay up to date on all vaccines. This information is not intended to replace advice given to you by your health care provider. Make sure you discuss any questions you have with your health care provider. Document Revised: 08/02/2018 Document Reviewed: 08/02/2018 Elsevier Patient Education  2020 [employer].

## 2020-06-19 ENCOUNTER — Other Ambulatory Visit: Payer: Self-pay | Admitting: Certified Nurse Midwife

## 2020-06-19 DIAGNOSIS — Z3041 Encounter for surveillance of contraceptive pills: Secondary | ICD-10-CM

## 2020-06-19 MED ORDER — LO LOESTRIN FE 1 MG-10 MCG / 10 MCG PO TABS: 1.0000 | ORAL_TABLET | Freq: Every day | ORAL | 4 refills | Status: DC

## 2020-06-19 NOTE — Progress Notes (Signed)
Rx Lo Loestrin, see orders.   Serafina Royals, CNM [facility], [facility] 06/19/20 11:40 PM

## 2020-09-14 ENCOUNTER — Other Ambulatory Visit: Payer: Self-pay | Admitting: Certified Nurse Midwife

## 2020-09-14 DIAGNOSIS — N926 Irregular menstruation, unspecified: Secondary | ICD-10-CM

## 2020-09-14 MED ORDER — MEDROXYPROGESTERONE ACETATE 10 MG PO TABS: 10.0000 mg | ORAL_TABLET | Freq: Every day | ORAL | 2 refills | Status: DC

## 2020-09-14 NOTE — Progress Notes (Signed)
Rx Provera, see orders.    Serafina Royals, CNM [facility], [facility] 09/14/20 8:48 AM

## 2020-11-26 ENCOUNTER — Telehealth: Payer: Self-pay | Admitting: Certified Nurse Midwife

## 2020-11-26 DIAGNOSIS — N926 Irregular menstruation, unspecified: Secondary | ICD-10-CM

## 2020-11-26 DIAGNOSIS — Z319 Encounter for procreative management, unspecified: Secondary | ICD-10-CM

## 2020-11-26 NOTE — Telephone Encounter (Signed)
Telephone call to patient, verified full name and date of birth.  Questions regarding irregular menses and desires for pregnancy. Stopped OCP earlier this year. Prescribed Provera in October to help with menstrual regulation. Reports menses in November and negative pregnancy test last week.   Encouraged to start Provera again this month. Instructions sent via MyChart.  Reviewed red flag symptoms and when to call.   RTC as previously scheduled or sooner if needed.    Serafina Royals, CNM [facility], [facility] 11/26/20 4:44 PM

## 2020-12-04 ENCOUNTER — Ambulatory Visit
Admission: EM | Admit: 2020-12-04 | Discharge: 2020-12-04 | Disposition: A | Discharge status: Home / Self Care | Payer: 59 | Attending: Internal Medicine | Admitting: Internal Medicine

## 2020-12-04 ENCOUNTER — Ambulatory Visit (INDEPENDENT_AMBULATORY_CARE_PROVIDER_SITE_OTHER): Payer: 59

## 2020-12-04 DIAGNOSIS — H6691 Otitis media, unspecified, right ear: Secondary | ICD-10-CM

## 2020-12-04 DIAGNOSIS — U071 COVID-19: Secondary | ICD-10-CM

## 2020-12-04 MED ORDER — AMOXICILLIN 875 MG PO TABS
875.0000 mg | ORAL_TABLET | Freq: Two times a day (BID) | ORAL | 0 refills | Status: DC
Start: 2020-12-04 — End: 2021-01-29

## 2020-12-04 MED ORDER — BENZONATATE 200 MG PO CAPS: 200.0000 mg | ORAL_CAPSULE | Freq: Two times a day (BID) | ORAL | 0 refills | Status: DC | PRN

## 2020-12-04 MED ORDER — ONDANSETRON 8 MG PO TBDP: 8.0000 mg | ORAL_TABLET | Freq: Three times a day (TID) | ORAL | 0 refills | Status: DC | PRN

## 2020-12-04 NOTE — ED Provider Notes (Signed)
Valerie Garner    CSN: [id number] Arrival date & time: 12/04/20  9892      History   Chief Complaint No chief complaint on file.   HPI Valerie Garner is a 24 y.o. female who present due to testing positive for covid and been running a fever up to 101.7, HA, N/V, chills, rhinitis, fatigue, cough, HA, and her R ear is hurting. She spoke with triage RN and was told  She needs eval for pneumonia.  Has been taking OTC meds, plus zinc and magnesium.   History reviewed. No pertinent past medical history.  There are no problems to display for this patient.   Past Surgical History:  Procedure Laterality Date  . adenoidectomy    . TONSILLECTOMY Bilateral     OB History    Gravida  1   Para  1   Term  1   Preterm      AB      Living  1     SAB      IAB      Ectopic      Multiple  0   Live Births  1            Home Medications    Prior to Admission medications   Medication Sig Start Date End Date Taking? Authorizing Provider  medroxyPROGESTERone (PROVERA) 10 MG tablet Take 1 tablet (10 mg total) by mouth daily. Use for ten days 09/14/20   Gunnar Bulla, CNM  sertraline (ZOLOFT) 50 MG tablet Take 1 tablet (50 mg total) by mouth daily. Patient not taking: Reported on 04/16/2020 10/30/19   [name], CNM  Vitamin D, Ergocalciferol, (DRISDOL) 1.25 MG (50000 UT) CAPS capsule Take 1 capsule (50,000 Units total) by mouth every 7 (seven) days. Patient not taking: Reported on 04/16/2020 10/10/19   Purcell Nails, CNM    Family History Family History  Problem Relation Age of Onset  . Breast cancer Neg Hx   . Ovarian cancer Neg Hx   . Colon cancer Neg Hx     Social History Social History   Tobacco Use  . Smoking status: Never Smoker  . Smokeless tobacco: Never Used  Vaping Use  . Vaping Use: Never used  Substance Use Topics  . Alcohol use: Not Currently  . Drug use: Never     Allergies   Patient has no known  allergies.   Review of Systems Review of Systems See HPI  Physical Exam Triage Vital Signs ED Triage Vitals  Enc Vitals Group     BP 12/04/20 1118 112/78     Pulse Rate 12/04/20 1118 (!) 107     Resp 12/04/20 1118 16     Temp 12/04/20 1118 99 F (37.2 C)     Temp Source 12/04/20 1118 Oral     SpO2 12/04/20 1118 97 %     Weight 12/04/20 1110 240 lb (108.9 kg)     Height --      Head Circumference --      Peak Flow --      Pain Score 12/04/20 1110 4     Pain Loc --      Pain Edu? --      Excl. in GC? --    No data found.  Updated Vital Signs BP 112/78 (BP Location: Left Arm)   Pulse (!) 107   Temp 99 F (37.2 C) (Oral)   Resp 16   Wt 240 lb (108.9  kg)   SpO2 97%   BMI 41.20 kg/m   Visual Acuity Right Eye Distance:   Left Eye Distance:   Bilateral Distance:    Right Eye Near:   Left Eye Near:    Bilateral Near:     Physical Exam Vitals and nursing note reviewed.  HENT:     Head: Normocephalic.     Right Ear: Tympanic membrane is erythematous.     Nose: Congestion present.     Mouth/Throat:     Mouth: Mucous membranes are moist.  Eyes:     General: No scleral icterus.    Conjunctiva/sclera: Conjunctivae normal.  Cardiovascular:     Rate and Rhythm: Normal rate and regular rhythm.     Heart sounds: No murmur heard.   Pulmonary:     Effort: Pulmonary effort is normal.     Breath sounds: Normal breath sounds.  Musculoskeletal:        General: Normal range of motion.     Cervical back: Neck supple.  Skin:    General: Skin is warm and dry.     Findings: No rash.  Neurological:     Mental Status: She is alert and oriented to person, place, and time.     Gait: Gait normal.  Psychiatric:        Mood and Affect: Mood normal.        Behavior: Behavior normal.        Thought Content: Thought content normal.        Judgment: Judgment normal.      UC Treatments / Results  Labs (all labs ordered are listed, but only abnormal results are  displayed) Labs Reviewed - No data to display  EKG   Radiology No results found.  Procedures Procedures (including critical care time)  Medications Ordered in UC Medications - No data to display  Initial Impression / Assessment and Plan / UC Course  I have reviewed the triage vital signs and the nursing notes. Covid infection. R OM. He was placed on Amoxicillin, Tesasalon perless, and Zofran. Supportive care advised, see instructions Was also prescribed Bensonate for cough Final Clinical Impressions(s) / UC Diagnoses   Final diagnoses:  None   Discharge Instructions   None    ED Prescriptions    None     PDMP not reviewed this encounter.   Garey Ham, PA-C 12/06/20 0005

## 2020-12-04 NOTE — Discharge Instructions (Signed)
If your Covid test ends up positive you may take the following supplements to help your immune system be stronger to fight this viral infection Take Quarcetin 500 mg three times a day x 7 days with Zinc 50 mg ones a day x 7 days. The quarcetin is an antiviral and anti-inflammatory supplement which helps open the zinc channels in the cell to absorb Zinc. Zinc helps decrease the virus load in your body. Take Melatonin 6-10 mg at bed time which also helps support your immune system.  Also make sure to take Vit D 5,000 IU per day with a fatty meal and Vit C 5000 mg a day until you are completely better. To prevent viral illnesses your vitamin D should be between 60-80. Stay on Vitamin D 2,000  and C  1000 mg the rest of the season.  Don't lay around, keep active and walk as much as you are able to to prevent worsening of your symptoms.  Follow up with your family Dr next week.  If you get short of breath and you are able to check  your oxygen with a pulse oxygen meter, if it gets to 92% or less, you need to go to the hospital to be admitted. If you dont have one, come back here and we will assess you.   

## 2020-12-04 NOTE — ED Triage Notes (Signed)
Patient presents to Urgent Care with complaints of fever, headache, right ear pain, vomiting, chills, runny nose, fatigue, cough. Tested positive for covid yesterday. Spoke with triage Rn instructed to get evaluated for pneumonia. Last temp 101.7. Treating with tylenol and cold medications, zinc, and mag.

## 2020-12-05 NOTE — L&D Delivery Note (Addendum)
       Delivery Note   Valerie Garner is a 25 y.o. G2P1001 at [redacted]w[redacted]d Estimated Date of Delivery: 09/30/21  PRE-OPERATIVE DIAGNOSIS:  1) [redacted]w[redacted]d pregnancy.  2) Obesity in pregnancy  POST-OPERATIVE DIAGNOSIS:  1) [redacted]w[redacted]d pregnancy s/p   2)obesity in pregnancy  Delivery Type:   Spontaneous Vaginal Delivery  Delivery Anesthesia:  Epidural  Labor Complications:  none    ESTIMATED BLOOD LOSS: 250 ml    FINDINGS:   1) female infant, Apgar scores of  8 at 1 minute and  9 at 5 minutes and a birthweight is pending, infant skin to skin.   2) Nuchal cord: none  SPECIMENS:   PLACENTA:   Appearance:  spontaneous, 3 vessel cord   Removal:    spontaneous    Disposition:  per protocol  DISPOSITION:  Infant to left in stable condition in the delivery room, with L&D personnel and mother,  NARRATIVE SUMMARY: Labor course:  Ms. Valerie Garner is a G2P1001 at [redacted]w[redacted]d who presented for induction of labor.  She progressed well in labor with pitocin.  She received the appropriate anesthesia epidural and proceeded to complete dilation. Nurses state that father of baby came out stating  the pt was having some pressure. Nurses went to the bedside to evaluate , while nurse in the room  monitor was not tracking fetal heart rate, while adjusting monitor they pulled back the blankets and found that head was delivered. RN assisted the delivery of babies body. Baby was vigorously crying at delivery. She delivered a viable female infant "Valerie Garner". The placenta delivered without problems and was noted to be complete. A perineal and vaginal examination was performed. Episiotomy/Lacerations: none. The patient tolerated this well. Pt has moderate bleeding, dose of methergine given. Bleeding slowed. Will continue to monitor.   [name], CNM  09/23/2021 4:10 PM

## 2021-01-29 ENCOUNTER — Other Ambulatory Visit: Payer: Self-pay

## 2021-01-29 ENCOUNTER — Encounter: Payer: Self-pay | Admitting: Certified Nurse Midwife

## 2021-01-29 ENCOUNTER — Ambulatory Visit (INDEPENDENT_AMBULATORY_CARE_PROVIDER_SITE_OTHER): Payer: 59 | Admitting: Certified Nurse Midwife

## 2021-01-29 VITALS — BP 130/80 | Ht 64.0 in | Wt 238.0 lb

## 2021-01-29 DIAGNOSIS — N926 Irregular menstruation, unspecified: Secondary | ICD-10-CM | POA: Diagnosis not present

## 2021-01-29 DIAGNOSIS — N912 Amenorrhea, unspecified: Secondary | ICD-10-CM

## 2021-01-29 DIAGNOSIS — Z3201 Encounter for pregnancy test, result positive: Secondary | ICD-10-CM

## 2021-01-29 LAB — POCT URINE PREGNANCY: Preg Test, Ur: POSITIVE — AB

## 2021-01-29 NOTE — Progress Notes (Signed)
GYN ENCOUNTER NOTE  Subjective:       Valerie Garner is a 25 y.o. G76P1001 female here for pregnancy confirmation.   This is a planned pregnancy. Patient reports provera induced cycle that lasted eight (8) days and then stopped for three (3) followed by another week of bleeding.   Tracked peak ovulation using testing strips.   No pregnancy symptoms besides missed menses and light pelvic cramping.   Denies difficulty breathing or respiratory distress, chest pain, abdominal pain, vaginal bleeding, dysuria, and leg pain or swelling.    Gynecologic History  Patient's last menstrual period was 12/14/2020 (approximate).  Gestational age: 30 weeks 4 days  Estimated date of birth: 09/20/2021  Contraception: none  Last Pap: 09/2018. Results were: normal per patient  Obstetric History  OB History  Gravida Para Term Preterm AB Living  2 1 1     1   SAB IAB Ectopic Multiple Live Births        0 1    # Outcome Date GA Lbr Len/2nd Weight Sex Delivery Anes PTL Lv  2 Current           1 Term 08/22/19 [redacted]w[redacted]d 10:35 / 00:48 6 lb 8.1 oz (2.95 kg) M Vag-Spont EPI N LIV    History reviewed. No pertinent past medical history.  Past Surgical History:  Procedure Laterality Date  . adenoidectomy    . TONSILLECTOMY Bilateral     Current Outpatient Medications on File Prior to Visit  Medication Sig Dispense Refill  . ondansetron (ZOFRAN-ODT) 8 MG disintegrating tablet Take 1 tablet (8 mg total) by mouth every 8 (eight) hours as needed for nausea or vomiting. (Patient not taking: Reported on 01/29/2021) 20 tablet 0  . [DISCONTINUED] sertraline (ZOLOFT) 50 MG tablet Take 1 tablet (50 mg total) by mouth daily. (Patient not taking: Reported on 04/16/2020) 30 tablet 1   No current facility-administered medications on file prior to visit.    No Known Allergies  Social History   Socioeconomic History  . Marital status: Married    Spouse name: Not on file  . Number of children: Not on  file  . Years of education: Not on file  . Highest education level: Not on file  Occupational History  . Not on file  Tobacco Use  . Smoking status: Never Smoker  . Smokeless tobacco: Never Used  Vaping Use  . Vaping Use: Never used  Substance and Sexual Activity  . Alcohol use: Not Currently  . Drug use: Never  . Sexual activity: Yes    Birth control/protection: None  Other Topics Concern  . Not on file  Social History Narrative  . Not on file   Social Determinants of Health   Financial Resource Strain: Not on file  Food Insecurity: Not on file  Transportation Needs: Not on file  Physical Activity: Not on file  Stress: Not on file  Social Connections: Not on file  Intimate Partner Violence: Not on file    Family History  Problem Relation Age of Onset  . Breast cancer Neg Hx   . Ovarian cancer Neg Hx   . Colon cancer Neg Hx     The following portions of the patient's history were reviewed and updated as appropriate: allergies, current medications, past family history, past medical history, past social history, past surgical history and problem list.  Review of Systems  ROS negative except as noted above. Information obtained from patient.   Objective:   BP 130/80  Ht 5\' 4"  (1.626 m)   Wt 238 lb (108 kg)   LMP 12/14/2020 (Approximate)   Breastfeeding No   BMI 40.85 kg/m    CONSTITUTIONAL: Well-developed, well-nourished female in no acute distress.   PHYSICAL EXAM: Not indicated.   Recent Results (from the past 2160 hour(s))  POCT urine pregnancy     Status: Abnormal   Collection Time: 01/29/21  4:03 PM  Result Value Ref Range   Preg Test, Ur Positive (A) Negative    Assessment:   1. Amenorrhea  - POCT urine pregnancy  2. Positive urine pregnancy test   Plan:   Labs today, see orders.   First trimester education, see AVS.   Reviewed red flag symptoms and when to call.   RTCx2-3wks for dating/viability ultrasound&intake.   RTCx5-6wks  for NOB PE or sooner if needed.    01/31/21, CNM [facility], [facility] 01/29/21 4:31 PM

## 2021-01-29 NOTE — Patient Instructions (Signed)
WHAT OB PATIENTS CAN EXPECT   Confirmation of pregnancy and ultrasound ordered if medically indicated-[redacted] weeks gestation  New OB (NOB) intake with nurse and New OB (NOB) labs- [redacted] weeks gestation  New OB (NOB) physical examination with provider- 11/[redacted] weeks gestation  Flu vaccine-[redacted] weeks gestation  Anatomy scan-[redacted] weeks gestation  Glucose tolerance test, blood work to test for anemia, T-dap vaccine-[redacted] weeks gestation  Vaginal swabs/cultures-STD/Group B strep-[redacted] weeks gestation  Appointments every 4 weeks until 28 weeks  Every 2 weeks from 28 weeks until 36 weeks  Weekly visits from 36 weeks until delivery     Healthy Weight Gain During Pregnancy A certain amount of weight gain during pregnancy is normal and healthy. The amount of weight you should gain during pregnancy depends on your overall health and your weight before you became pregnant. Talk with your health care provider to find out how much weight you should gain during your pregnancy. General guidelines for a healthy total weight gain during pregnancy are based on your body mass index (BMI) and are listed below. If your BMI at or before the start of your pregnancy is:  Less than 18.5 (underweight), you should gain 28-40 lb (13-18 kg).  18.5-24.9 (normal weight), you should gain 25-35 lb (11-16 kg).  25-29.9 (overweight), you should gain 15-25 lb (7-11 kg).  30 or higher (obese), you should gain 11-20 lb (5-9 kg). Your health care provider may recommend that you:  Gain more weight, if you were underweight before pregnancy or if you are pregnant with more than one baby.  Gain less weight, if you were overweight before pregnancy or if you are gaining too much weight during your pregnancy. How does unhealthy weight gain affect me? Gaining too much weight during pregnancy can lead to pregnancy complications, such as:  A temporary form of diabetes that develops during pregnancy (gestational diabetes).  Hypertensive  disorders of pregnancy (preeclampsia or gestational hypertension).  Raising your risk of having a more difficult delivery or a surgical delivery (cesarean delivery, or C-section). How does unhealthy weight gain affect my baby? Not gaining enough weight can be life-threatening for your baby. It may also raise these risks for your baby:  Being born early (preterm).  Being smaller than normal during pregnancy or not growing normally (intrauterine growth restriction).  Having a low weight at birth. Gaining too much weight may raise these risks for your baby:  Growing larger than normal during pregnancy (large for gestational age or macrosomia).  Increased risk of obesity. What actions can I take to gain a healthy amount of weight during pregnancy? Nutrition  Eat healthy foods. Every day, try to eat: ? Fruits and vegetables. Include a variety of colors and types, like sweet potatoes, oranges, apples, bell peppers, beets, berries, squash, and broccoli. ? Whole grains, such as millet, barley, whole-wheat breads and cereals, and oatmeal. ? Low-fat dairy products, like yogurt, milk, and cheese. You can also include non-dairy choices, like almond milk or rice milk. ? Protein-rich foods, like lean meat, chicken, eggs, peas, and beans.  Avoid foods that are fried or have a lot of fat, salt (sodium), or sugar.  Drink enough fluid to keep your urine pale yellow.  Choose healthy snack and drink options when you are away from home: ? Drink water. Avoid soda, sports drinks, and juices that have added sugar. ? Avoid drinks with caffeine, such as coffee and energy drinks. ? Eat snacks that are high in protein, such as nuts, protein bars, and  low-fat yogurt. ? Carry convenient snacks with you that do not need refrigeration, such as a pack of trail mix, an apple, or a granola bar.  If you need help improving your diet, work with a health care provider or a dietitian.   Activity  Exercise regularly,  as told by your health care provider. ? If you were active before becoming pregnant, you may be able to continue your regular fitness activities. ? If you were not active before pregnancy, you may gradually build up to exercising for 30 or more minutes on most days of the week. This may include walking, swimming, or yoga.  Ask your health care provider what activities are safe for you. Talk with your health care provider about whether you may need to be excused from certain school or work activities.   Follow these instructions at home:  Take over-the-counter and prescription medicines only as told by your health care provider.  Take all prenatal supplements as told by your health care provider.  Keep track of your weight gain during pregnancy.  Keep all health care visits during pregnancy (prenatal visits). These visits are a good time to discuss your weight gain. Your health care provider will weigh you at each visit to make sure you are gaining a healthy amount of weight. Where to find support Some pregnant women and teens face unique challenges and need extra support. If you have questions or need help gaining a healthy amount of weight during pregnancy, these people may help:  Your health care provider.  A dietitian.  Your school nurse.  Your family and friends.  Local counseling centers, church groups, or clinics that have services for teens. Where to find more information Learn more about managing your weight gain during pregnancy from:  American Pregnancy Association: americanpregnancy.org  [place] Department of Agriculture pregnancy weight gain calculator: http://www.wilson-mendoza.org/ Contact a health care provider if:  You are unable to eat or drink for longer than 24 hours.  You cannot afford food or have trouble accessing regular meals. Get help right away if you: Summary  Talk with your health care provider to find out how much weight you should gain during your pregnancy.  Too  much or too little weight gain during pregnancy can lead to complications for you and your baby.  Eat healthy foods like fruits and vegetables, whole grains, low-fat dairy products, and protein-rich foods.  Ask your health care provider what activities are safe for you.  Keep all of your prenatal visits. This information is not intended to replace advice given to you by your health care provider. Make sure you discuss any questions you have with your health care provider. Document Revised: 06/18/2020 Document Reviewed: 06/18/2020 Elsevier Patient Education  [place].   Common Medications Safe in Pregnancy  Acne:      Constipation:  Benzoyl Peroxide     Colace  Clindamycin      Dulcolax Suppository  Topica Erythromycin     Fibercon  Salicylic Acid      Metamucil         Miralax AVOID:        Senakot   Accutane    Cough:  Retin-A       Cough Drops  Tetracycline      Phenergan w/ Codeine if Rx  Minocycline      Robitussin (Plain & DM)  Antibiotics:     Crabs/Lice:  Ceclor       RID  Cephalosporins  AVOID:  E-Mycins      Kwell  Keflex  Macrobid/Macrodantin   Diarrhea:  Penicillin      Kao-Pectate  Zithromax      Imodium AD         PUSH FLUIDS AVOID:       Cipro     Fever:  Tetracycline      Tylenol (Regular or Extra  Minocycline       Strength)  Levaquin      Extra Strength-Do not          Exceed 8 tabs/24 hrs Caffeine:        <291m/day (equiv. To 1 cup of coffee or  approx. 3 12 oz sodas)         Gas: Cold/Hayfever:       Gas-X  Benadryl      Mylicon  Claritin       Phazyme  **Claritin-D        Chlor-Trimeton    Headaches:  Dimetapp      ASA-Free Excedrin  Drixoral-Non-Drowsy     Cold Compress  Mucinex (Guaifenasin)     Tylenol (Regular or Extra  Sudafed/Sudafed-12 Hour     Strength)  **Sudafed PE Pseudoephedrine   Tylenol Cold & Sinus     Vicks Vapor Rub  Zyrtec  **AVOID if Problems With Blood Pressure         Heartburn: Avoid lying down  for at least 1 hour after meals  Aciphex      Maalox     Rash:  Milk of Magnesia     Benadryl    Mylanta       1% Hydrocortisone Cream  Pepcid  Pepcid Complete   Sleep Aids:  Prevacid      Ambien   Prilosec       Benadryl  Rolaids       Chamomile Tea  Tums (Limit 4/day)     Unisom         Tylenol PM         Warm milk-add vanilla or  Hemorrhoids:       Sugar for taste  Anusol/Anusol H.C.  (RX: Analapram 2.5%)  Sugar Substitutes:  Hydrocortisone OTC     Ok in moderation  Preparation H      Tucks        Vaseline lotion applied to tissue with wiping    Herpes:     Throat:  Acyclovir      Oragel  Famvir  Valtrex     Vaccines:         Flu Shot Leg Cramps:       *Gardasil  Benadryl      Hepatitis A         Hepatitis B Nasal Spray:       Pneumovax  Saline Nasal Spray     Polio Booster         Tetanus Nausea:       Tuberculosis test or PPD  Vitamin B6 25 mg TID   AVOID:    Dramamine      *Gardasil  Emetrol       Live Poliovirus  Ginger Root 250 mg QID    MMR (measles, mumps &  High Complex Carbs @ Bedtime    rebella)  Sea Bands-Accupressure    Varicella (Chickenpox)  Unisom 1/2 tab TID     *No known complications           If received before Pain:  Known pregnancy;   Darvocet       Resume series after  Lortab        Delivery  Percocet    Yeast:   Tramadol      Femstat  Tylenol 3      Gyne-lotrimin  Ultram       Monistat  Vicodin           MISC:         All Sunscreens           Hair Coloring/highlights          Insect Repellant's          (Including DEET)         Mystic Tans    [email or web address]">  First Trimester of Pregnancy  The first trimester of pregnancy starts on the first day of your last menstrual period until the end of week 12. This is also called months 1 through 3 of pregnancy. Body changes during your first trimester Your body goes through many changes during pregnancy. The changes usually return to normal after  your baby is born. Physical changes  You may gain or lose weight.  Your breasts may grow larger and hurt. The area around your nipples may get darker.  Dark spots or blotches may develop on your face.  You may have changes in your hair. Health changes  You may feel like you might vomit (nauseous), and you may vomit.  You may have heartburn.  You may have headaches.  You may have trouble pooping (constipation).  Your gums may bleed. Other changes  You may get tired easily.  You may pee (urinate) more often.  Your menstrual periods will stop.  You may not feel hungry.  You may want to eat certain kinds of food.  You may have changes in your emotions from day to day.  You may have more dreams. Follow these instructions at home: Medicines  Take over-the-counter and prescription medicines only as told by your doctor. Some medicines are not safe during pregnancy.  Take a prenatal vitamin that contains at least 600 micrograms (mcg) of folic acid. Eating and drinking  Eat healthy meals that include: ? Fresh fruits and vegetables. ? Whole grains. ? Good sources of protein, such as meat, eggs, or tofu. ? Low-fat dairy products.  Avoid raw meat and unpasteurized juice, milk, and cheese.  If you feel like you may vomit, or you vomit: ? Eat 4 or 5 small meals a day instead of 3 large meals. ? Try eating a few soda crackers. ? Drink liquids between meals instead of during meals.  You may need to take these actions to prevent or treat trouble pooping: ? Drink enough fluids to keep your pee (urine) pale yellow. ? Eat foods that are high in fiber. These include beans, whole grains, and fresh fruits and vegetables. ? Limit foods that are high in fat and sugar. These include fried or sweet foods. Activity  Exercise only as told by your doctor. Most people can do their usual exercise routine during pregnancy.  Stop exercising if you have cramps or pain in your lower belly  (abdomen) or low back.  Do not exercise if it is too hot or too humid, or if you are in a place of great height (high altitude).  Avoid heavy lifting.  If you choose to, you may have sex unless your doctor tells you not to. Relieving pain and discomfort  Wear a good support bra if  your breasts are sore.  Rest with your legs raised (elevated) if you have leg cramps or low back pain.  If you have bulging veins (varicose veins) in your legs: ? Wear support hose as told by your doctor. ? Raise your feet for 15 minutes, 3-4 times a day. ? Limit salt in your food. Safety  Wear your seat belt at all times when you are in a car.  Talk with your doctor if someone is hurting you or yelling at you.  Talk with your doctor if you are feeling sad or have thoughts of hurting yourself. Lifestyle  Do not use hot tubs, steam rooms, or saunas.  Do not douche. Do not use tampons or scented sanitary pads.  Do not use herbal medicines, illegal drugs, or medicines that are not approved by your doctor. Do not drink alcohol.  Do not smoke or use any products that contain nicotine or tobacco. If you need help quitting, ask your doctor.  Avoid cat litter boxes and soil that is used by cats. These carry germs that can cause harm to the baby and can cause a loss of your baby by miscarriage or stillbirth. General instructions  Keep all follow-up visits. This is important.  Ask for help if you need counseling or if you need help with nutrition. Your doctor can give you advice or tell you where to go for help.  Visit your dentist. At home, brush your teeth with a soft toothbrush. Floss gently.  Write down your questions. Take them to your prenatal visits. Where to find more information  American Pregnancy Association: americanpregnancy.org  [employer]: www.acog.org  Office on Women's Health: KeywordPortfolios.com.br Contact a doctor if:  You are  dizzy.  You have a fever.  You have mild cramps or pressure in your lower belly.  You have a nagging pain in your belly area.  You continue to feel like you may vomit, you vomit, or you have watery poop (diarrhea) for 24 hours or longer.  You have a bad-smelling fluid coming from your vagina.  You have pain when you pee.  You are exposed to a disease that spreads from person to person, such as chickenpox, measles, Zika virus, HIV, or hepatitis. Get help right away if:  You have spotting or bleeding from your vagina.  You have very bad belly cramping or pain.  You have shortness of breath or chest pain.  You have any kind of injury, such as from a fall or a car crash.  You have new or increased pain, swelling, or redness in an arm or leg. Summary  The first trimester of pregnancy starts on the first day of your last menstrual period until the end of week 12 (months 1 through 3).  Eat 4 or 5 small meals a day instead of 3 large meals.  Do not smoke or use any products that contain nicotine or tobacco. If you need help quitting, ask your doctor.  Keep all follow-up visits. This information is not intended to replace advice given to you by your health care provider. Make sure you discuss any questions you have with your health care provider. Document Revised: 04/29/2020 Document Reviewed: 03/05/2020 Elsevier Patient Education  2021 [employer].

## 2021-01-30 LAB — PROGESTERONE: Progesterone: 12.9 ng/mL

## 2021-01-30 LAB — BETA HCG QUANT (REF LAB): hCG Quant: 5389 m[IU]/mL

## 2021-02-10 ENCOUNTER — Ambulatory Visit
Admission: RE | Admit: 2021-02-10 | Discharge: 2021-02-10 | Disposition: A | Discharge status: Home / Self Care | Payer: 59 | Source: Ambulatory Visit | Attending: Certified Nurse Midwife | Admitting: Certified Nurse Midwife

## 2021-02-10 ENCOUNTER — Other Ambulatory Visit: Payer: Self-pay

## 2021-02-10 DIAGNOSIS — N912 Amenorrhea, unspecified: Secondary | ICD-10-CM | POA: Diagnosis not present

## 2021-02-10 DIAGNOSIS — Z3201 Encounter for pregnancy test, result positive: Secondary | ICD-10-CM | POA: Insufficient documentation

## 2021-02-10 DIAGNOSIS — N83291 Other ovarian cyst, right side: Secondary | ICD-10-CM | POA: Diagnosis not present

## 2021-02-10 DIAGNOSIS — O3481 Maternal care for other abnormalities of pelvic organs, first trimester: Secondary | ICD-10-CM | POA: Diagnosis not present

## 2021-02-10 DIAGNOSIS — Z3A01 Less than 8 weeks gestation of pregnancy: Secondary | ICD-10-CM | POA: Diagnosis not present

## 2021-02-26 ENCOUNTER — Ambulatory Visit (INDEPENDENT_AMBULATORY_CARE_PROVIDER_SITE_OTHER): Payer: 59 | Admitting: Certified Nurse Midwife

## 2021-02-26 ENCOUNTER — Other Ambulatory Visit: Payer: Self-pay

## 2021-02-26 VITALS — BP 123/82 | HR 101 | Ht 64.0 in | Wt 235.0 lb

## 2021-02-26 DIAGNOSIS — Z8759 Personal history of other complications of pregnancy, childbirth and the puerperium: Secondary | ICD-10-CM | POA: Diagnosis not present

## 2021-02-26 DIAGNOSIS — Z8659 Personal history of other mental and behavioral disorders: Secondary | ICD-10-CM

## 2021-02-26 DIAGNOSIS — Z202 Contact with and (suspected) exposure to infections with a predominantly sexual mode of transmission: Secondary | ICD-10-CM

## 2021-02-26 DIAGNOSIS — R69 Illness, unspecified: Secondary | ICD-10-CM | POA: Diagnosis not present

## 2021-02-26 DIAGNOSIS — Z6841 Body Mass Index (BMI) 40.0 and over, adult: Secondary | ICD-10-CM

## 2021-02-26 DIAGNOSIS — O219 Vomiting of pregnancy, unspecified: Secondary | ICD-10-CM

## 2021-02-26 DIAGNOSIS — Z3481 Encounter for supervision of other normal pregnancy, first trimester: Secondary | ICD-10-CM

## 2021-02-26 NOTE — Progress Notes (Signed)
I have reviewed the record and concur with patient management and plan of care.    Serafina Royals, CNM [facility], [facility] 02/26/21 3:45 PM

## 2021-02-26 NOTE — Patient Instructions (Addendum)
WHAT OB PATIENTS CAN EXPECT   Confirmation of pregnancy and ultrasound ordered if medically indicated-[redacted] weeks gestation  New OB (NOB) intake with nurse and New OB (NOB) labs- [redacted] weeks gestation  New OB (NOB) physical examination with provider- 11/[redacted] weeks gestation  Flu vaccine-[redacted] weeks gestation  Anatomy scan-[redacted] weeks gestation  Glucose tolerance test, blood work to test for anemia, T-dap vaccine-[redacted] weeks gestation  Vaginal swabs/cultures-STD/Group B strep-[redacted] weeks gestation  Appointments every 4 weeks until 28 weeks  Every 2 weeks from 28 weeks until 36 weeks  Weekly visits from 41 weeks until delivery  Pregnancy and COVID-19 Pregnant women and women who were recently pregnant are at an increased risk for severe illness from COVID-19. Other conditions, such as being pregnant at an older age or having diabetes or obesity, can further increase the risk of severe illness from COVID-19. This risk can last for at least 42 days following the end of the pregnancy. Protect yourself and your baby by:  Knowing your risk factors. Ask your health care provider about your specific risk factors.  Working with your health care team to protect yourself against all infections, including COVID-19. How does COVID-19 affect me? If you get COVID-19 while pregnant or shortly after your pregnancy, there is an increased risk that you may:  Get a respiratory illness that can lead to pneumonia or severe illness.  Give birth to your baby before 37 weeks of pregnancy (preterm birth).  Have other complications that can affect your pregnancy. How does COVID-19 affect my care? If you have COVID-19, special precautions will be taken around your pregnancy:  You will have to notify the clinic or hospital before a visit. Steps will be taken to protect other people from the virus, including seeing you in a special room.  Tests and scans may be done differently before delivery (prenatal care).  Your birth  plan may change, including what room you will be in and who may be with you during labor and delivery.  You may stay longer in the hospital after delivery (postpartum care).  COVID-19 will affect where your baby will stay after delivery. Ask about the risks and benefits of staying in the same room with your baby. Benefits include breastfeeding and mother-newborn bonding.  You may have to feed your baby differently.  Visitors will be limited after your baby is born. How does COVID-19 affect my baby? It is very rare for a mother with COVID-19 to pass the virus to the unborn baby. After birth, a baby can get the virus if he or she is exposed to it. Ask your health care provider about ways to protect your baby. The baby can be placed in an incubator. A physical barrier can also be used. What can I do to lower my risk? Medicines and vaccines  You can receive a COVID-19 vaccination. This can protect you from severe illness. If you have concerns, talk to your health care provider.  Get other recommended vaccines, including the flu vaccine and the whooping cough (Tdap) vaccine.  Ask your health care provider if you can get a 30-day, or longer, supply of your medicines, so you can make fewer trips to the pharmacy.  If you have received a COVID-19 vaccine, consider enrolling in the v-safe program from the CDC. This program uses an app on your smartphone to provide check-ins and gather information on your health after you receive the vaccine. There is a separate registry for pregnant women. For more information, visit: ?  V-safe tool: http://boyd.org/ ? V-safe pregnancy registry: SuperiorMarketers.be Cleaning and personal hygiene If you are in isolation for COVID-19 and are sharing a room with your newborn, take these steps to reduce the risk of spreading the virus to your newborn:  Wash your hands with soap and water for at least 20 seconds before holding or caring for your baby.  If soap and water are not available, use alcohol-based hand sanitizer.  Wear a mask when within 6 feet (1.8 m) of your baby.  Keep your baby more than 6 feet (1.8 m) away from you as much as possible.  Avoid touching your mouth, face, eyes, or nose before washing your hands.  Clean and disinfect objects and surfaces that are frequently touched.   Other things to do  Avoid people who might have been exposed to or infected with COVID-19, including people who live with you.  Cover your mouth and nose by wearing a mask or other cloth covering over your face when you go out in public.  Avoid people who are not wearing a mask.  Avoid large crowds. Maintain at least 6 feet (1.8 m) between yourself and others.  Avoid poorly ventilated spaces.  Call your health care provider if you have any health concerns. ? Contact your health care provider right away if you think you have COVID-19. Tell your health care provider that you think you may have a COVID-19 infection and that you are pregnant. Breastfeeding tips Plan with your family and health care team how to feed your baby. Current research shows that the virus may not pass to a baby through breast milk. If you are breastfeeding, you can receive a COVID-19 vaccine. The vaccines pose no risk for breastfeeding mothers or their babies.  Some of the vaccines might create antibodies in breast milk. These antibodies can help to protect your baby. Take precautions if you have or may have COVID-19. Precautions include:  Washing hands with soap and water for at least 20 seconds before feeding your baby. If soap and water are not available, use alcohol-based hand sanitizer.  Wearing a mask while feeding your baby.  Pumping or expressing breast milk to feed to your baby. If possible, ask someone in your household who is not sick to feed your baby the expressed breast milk. ? Wash your hands with soap and water for at least 20 seconds before touching  pump parts. ? Wash and disinfect all pump parts after expressing milk. Follow the manufacturer's instructions to clean and disinfect all pump parts. Follow these instructions: Managing stress Some pregnant and postpartum women may have fear, uncertainty, and stress because of COVID-19. Find ways to manage stress. These may include:  Using relaxation techniques such as meditation and deep breathing.  Getting regular exercise. Most women can continue their usual exercise routine during pregnancy. Ask your health care provider what activities are safe for you.  Seeking support from family, friends, or spiritual resources. If you cannot be together in person, you can still connect by phone calls, texts, video calls, or online messaging.  Doing relaxing activities that you enjoy, such as listening to music or reading a good book. General instructions  Follow your health care provider's instructions on taking medicines. Some medicines may not be safe to take during pregnancy.  Ask for help if you have counseling or nutritional needs. Your health care provider can offer advice or refer you to resources or specialists who can help you with various needs.  Keep all follow-up  visits. This is important. This includes visits before and after you have your baby. Questions to ask your health care team  What should I do if I have COVID-19 symptoms?  What are the side effects that can occur after receiving any of the available COVID-19 vaccines?  How will COVID-19 affect my prenatal care visits, tests and scans, labor and delivery, and postpartum care?  What are the risks of COVID-19 to me and the potential risks to my unborn baby or infant?  How do vaccines pass antibodies to my unborn baby?  Should I plan to breastfeed my baby?  Where can I find mental health resources?  Where can I find support if I have financial concerns? Where to find more information  CDC:  PopCorners.nl  World Health Organization [facility]): HandicappingSports.nl  [employer] and Gynecologists (ACOG): www.acog.org Contact a health care provider if:  You have signs and symptoms of infection, including a fever or cough. ? Tell your health care team that you think you may have a COVID-19 infection and that you are pregnant.  You have strong emotions, such as sadness or anxiety.  You feel unsafe in your home and need help finding a safe place to live.  You have bloody or watery vaginal discharge or vaginal bleeding. Get help right away if:  You have signs or symptoms of labor before 37 weeks of pregnancy. These include: ? Contractions that are 5 minutes or less apart, or that increase in frequency, intensity, or length. ? Sudden, sharp pain in the abdomen or in the lower back. ? A gush or trickle of fluid from your vagina.  You have signs of more serious illness, such as: ? Trouble breathing. ? Chest pain. ? A fever of 102.20F (39C) or higher that does not go away. ? Vomiting every time you drink fluids. ? Feeling extremely weak. ? Fainting. These symptoms may represent a serious problem that is an emergency. Do not wait to see if the symptoms will go away. Get medical help right away. Call your local emergency services (911 in the [place] Do not drive yourself to the hospital. Summary  Pregnant women and women who were recently pregnant are at an increased risk for severe illness from COVID-19.  Take precautions to protect yourself and your baby. Wear a mask. Wash hands often. Avoid touching your mouth, face, eyes, or nose before washing hands. Avoid large groups of people and stay away from people who are sick.  If you think you have a COVID-19 infection, contact your health care provider right away. Tell your health care provider that you think you have COVID-19 and that you are pregnant.  If you have COVID-19,  special precautions may be taken during pregnancy, labor and delivery, and after delivery. This information is not intended to replace advice given to you by your health care provider. Make sure you discuss any questions you have with your health care provider. Document Revised: 10/05/2020 Document Reviewed: 10/05/2020 Elsevier Patient Education  2021 [employer]. Morning Sickness  Morning sickness is when you feel like you may vomit (feel nauseous) during pregnancy. Sometimes, you may vomit. Morning sickness most often happens in the morning, but it can also happen at any time of the day. Some women may have morning sickness that makes them vomit all the time. This is a more serious problem that needs treatment. What are the causes? The cause of this condition is not known. What increases the risk?  You had vomiting  or a feeling like you may vomit before your pregnancy.  You had morning sickness in another pregnancy.  You are pregnant with more than one baby, such as twins. What are the signs or symptoms?  Feeling like you may vomit.  Vomiting. How is this treated? Treatment is usually not needed for this condition. You may only need to change what you eat. In some cases, your doctor may give you some things to take for your condition. These include:  Vitamin B6 supplements.  Medicines to treat the feeling that you may vomit.  Ginger. Follow these instructions at home: Medicines  Take over-the-counter and prescription medicines only as told by your doctor. Do not take any medicines until you talk with your doctor about them first.  Take multivitamins before you get pregnant. These can stop or lessen the symptoms of morning sickness. Eating and drinking  Eat dry toast or crackers before getting out of bed.  Eat 5 or 6 small meals a day.  Eat dry and bland foods like rice and baked potatoes.  Do not eat greasy, fatty, or spicy foods.  Have someone cook for you if the  smell of food causes you to vomit or to feel like you may vomit.  If you feel like you may vomit after taking prenatal vitamins, take them at night or with a snack.  Eat protein foods when you need a snack. Nuts, yogurt, and cheese are good choices.  Drink fluids throughout the day.  Try ginger ale made with real ginger, ginger tea made from fresh grated ginger, or ginger candies. General instructions  Do not smoke or use any products that contain nicotine or tobacco. If you need help quitting, ask your doctor.  Use an air purifier to keep the air in your house free of smells.  Get lots of fresh air.  Try to avoid smells that make you feel sick.  Try wearing an acupressure wristband. This is a wristband that is used to treat seasickness.  Try a treatment called acupuncture. In this treatment, a doctor puts needles into certain areas of your body to make you feel better. Contact a doctor if:  You need medicine to feel better.  You feel dizzy or light-headed.  You are losing weight. Get help right away if:  The feeling that you may vomit will not go away, or you cannot stop vomiting.  You faint.  You have very bad pain in your belly. Summary  Morning sickness is when you feel like you may vomit (feel nauseous) during pregnancy.  You may feel sick in the morning, but you can feel this way at any time of the day.  Making some changes to what you eat may help your symptoms go away. This information is not intended to replace advice given to you by your health care provider. Make sure you discuss any questions you have with your health care provider. Document Revised: 07/06/2020 Document Reviewed: 06/15/2020 Elsevier Patient Education  2021 [employer]. How a Baby Grows During Pregnancy Pregnancy begins when a female's sperm enters a female's egg. This is called fertilization. Fertilization usually happens in one of the fallopian tubes that connect the ovaries to the uterus.  The fertilized egg moves down the fallopian tube to the uterus. Once it reaches the uterus, it implants into the lining of the uterus and begins to grow. For the first 8 weeks, the fertilized egg is called an embryo. After 8 weeks, it is called a fetus. As  the fetus continues to grow, it receives oxygen and nutrients through the placenta, which is an organ that grows to support the developing baby. The placenta is the life support system for the baby. It provides oxygen and nutrition and removes waste. How long does a typical pregnancy last? A pregnancy usually lasts 280 days, or about 40 weeks. Pregnancy is divided into three periods of growth, also called trimesters:  First trimester: 0-12 weeks.  Second trimester: 13-27 weeks.  Third trimester: 28-40 weeks. The day when your baby is ready to be born (full term) is your estimated date of delivery. However, most babies are not born on their estimated date of delivery. How does my baby develop month by month? First month  The fertilized egg attaches to the inside of the uterus.  Some cells will form the placenta. Others will form the fetus.  The arms, legs, brain, spinal cord, lungs, and heart begin to develop.  At the end of the first month, the heart begins to beat. Second month  The bones, inner ear, eyelids, hands, and feet form.  The genitals develop.  By the end of 8 weeks, all major organs are developing. Third month  All of the internal organs are forming.  Teeth develop below the gums.  Bones and muscles begin to grow. The spine can flex.  The skin is transparent.  Fingernails and toenails begin to form.  Arms and legs continue to grow longer, and hands and feet develop.  The fetus is about 3 inches (7.6 cm) long. Fourth month  The placenta is completely formed.  The external sex organs, neck, outer ear, eyebrows, eyelids, and fingernails are formed.  The fetus can hear, swallow, and move its arms and  legs.  The kidneys begin to produce urine.  The skin is covered with a white, waxy coating (vernix) and very fine hair (lanugo). Fifth month  The fetus moves around more and can be felt for the first time (quickening).  The fetus starts to sleep and wake up and may begin to suck a finger.  The nails grow to the end of the fingers.  The organ in the digestive system that makes bile (gallbladder) functions and helps to digest nutrients.  If the fetus is a female, eggs are present in the ovaries. If the fetus is a female, testicles start to move down into the scrotum. Sixth month  The lungs are formed.  The eyes open. The brain continues to develop.  Your baby has fingerprints and toe prints. Your baby's hair grows thicker.  At the end of the second trimester, the fetus is about 9 inches (22.9 cm) long. Seventh month  The fetus kicks and stretches.  The eyes are developed enough to sense changes in light.  The hands can make a grasping motion.  The fetus responds to sound. Eighth month  Most organs and body systems are fully developed and functioning.  Bones harden, and taste buds develop. The fetus may hiccup.  Certain areas of the brain are still developing. The skull remains soft. Ninth month  The fetus gains about  lb (0.23 kg) each week.  The lungs are fully developed.  Patterns of sleep develop.  The fetus's head typically moves into a head-down position (vertex) in the uterus to prepare for birth.  The fetus weighs 6-9 lb (2.72-4.08 kg) and is 19-20 inches (48.26-50.8 cm) long.   How do I know if my baby is developing well? Always talk with your health care  provider about any concerns that you may have about your pregnancy and your baby. At each prenatal visit, your health care provider will do several different tests to check on your health and keep track of your baby's development. These include:  Fundal height and position. To do this, your health care  provider will: ? Measure your growing belly from your pubic bone to the top of the uterus using a tape measure. ? Feel your belly to determine your baby's position.  Heartbeat. An ultrasound in the first trimester can confirm pregnancy and show a heartbeat, depending on how far along you are. Your health care provider will check your baby's heart rate at every prenatal visit. You will also have a second trimester ultrasound to check your baby's development. Follow these instructions at home:  Take prenatal vitamins as told by your health care provider. These include vitamins such as folic acid, iron, calcium, and vitamin D. They are important for healthy development.  Take over-the-counter and prescription medicines only as told by your health care provider.  Keep all follow-up visits. This is important. Follow-up visits include prenatal care and screening tests. Summary  A pregnancy usually lasts 280 days, or about 40 weeks. Pregnancy is divided into three periods of growth, also called trimesters.  Your health care provider will monitor your baby's growth and development throughout your pregnancy.  Follow your health care provider's recommendations about taking prenatal vitamins and medicines during your pregnancy.  Talk with your health care provider if you have any concerns about your pregnancy or your developing baby. This information is not intended to replace advice given to you by your health care provider. Make sure you discuss any questions you have with your health care provider. Document Revised: 04/29/2020 Document Reviewed: 03/05/2020 Elsevier Patient Education  [place]. Obstetrics: Normal and Problem Pregnancies (7th ed., pp. 102-121). [place], [place]: Elsevier."> Textbook of Family Medicine (9th ed., pp. [phone]). [place], [place]: Elsevier [employer].">  First Trimester of Pregnancy  The first trimester of pregnancy starts on the first day of your last menstrual  period until the end of week 12. This is months 1 through 3 of pregnancy. A week after a sperm fertilizes an egg, the egg will implant into the wall of the uterus and begin to develop into a baby. By the end of 12 weeks, all the baby's organs will be formed and the baby will be 2-3 inches in size. Body changes during your first trimester Your body goes through many changes during pregnancy. The changes vary and generally return to normal after your baby is born. Physical changes  You may gain or lose weight.  Your breasts may begin to grow larger and become tender. The tissue that surrounds your nipples (areola) may become darker.  Dark spots or blotches (chloasma or mask of pregnancy) may develop on your face.  You may have changes in your hair. These can include thickening or thinning of your hair or changes in texture. Health changes  You may feel nauseous, and you may vomit.  You may have heartburn.  You may develop headaches.  You may develop constipation.  Your gums may bleed and may be sensitive to brushing and flossing. Other changes  You may tire easily.  You may urinate more often.  Your menstrual periods will stop.  You may have a loss of appetite.  You may develop cravings for certain kinds of food.  You may have changes in your emotions from day to day.  You may have more vivid and strange dreams. Follow these instructions at home: Medicines  Follow your health care provider's instructions regarding medicine use. Specific medicines may be either safe or unsafe to take during pregnancy. Do not take any medicines unless told to by your health care provider.  Take a prenatal vitamin that contains at least 600 micrograms (mcg) of folic acid. Eating and drinking  Eat a healthy diet that includes fresh fruits and vegetables, whole grains, good sources of protein such as meat, eggs, or tofu, and low-fat dairy products.  Avoid raw meat and unpasteurized juice,  milk, and cheese. These carry germs that can harm you and your baby.  If you feel nauseous or you vomit: ? Eat 4 or 5 small meals a day instead of 3 large meals. ? Try eating a few soda crackers. ? Drink liquids between meals instead of during meals.  You may need to take these actions to prevent or treat constipation: ? Drink enough fluid to keep your urine pale yellow. ? Eat foods that are high in fiber, such as beans, whole grains, and fresh fruits and vegetables. ? Limit foods that are high in fat and processed sugars, such as fried or sweet foods. Activity  Exercise only as directed by your health care provider. Most people can continue their usual exercise routine during pregnancy. Try to exercise for 30 minutes at least 5 days a week.  Stop exercising if you develop pain or cramping in the lower abdomen or lower back.  Avoid exercising if it is very hot or humid or if you are at high altitude.  Avoid heavy lifting.  If you choose to, you may have sex unless your health care provider tells you not to. Relieving pain and discomfort  Wear a good support bra to relieve breast tenderness.  Rest with your legs elevated if you have leg cramps or low back pain.  If you develop bulging veins (varicose veins) in your legs: ? Wear support hose as told by your health care provider. ? Elevate your feet for 15 minutes, 3-4 times a day. ? Limit salt in your diet. Safety  Wear your seat belt at all times when driving or riding in a car.  Talk with your health care provider if someone is verbally or physically abusive to you.  Talk with your health care provider if you are feeling sad or have thoughts of hurting yourself. Lifestyle  Do not use hot tubs, steam rooms, or saunas.  Do not douche. Do not use tampons or scented sanitary pads.  Do not use herbal remedies, alcohol, illegal drugs, or medicines that are not approved by your health care provider. Chemicals in these products  can harm your baby.  Do not use any products that contain nicotine or tobacco, such as cigarettes, e-cigarettes, and chewing tobacco. If you need help quitting, ask your health care provider.  Avoid cat litter boxes and soil used by cats. These carry germs that can cause birth defects in the baby and possibly loss of the unborn baby (fetus) by miscarriage or stillbirth. General instructions  During routine prenatal visits in the first trimester, your health care provider will do a physical exam, perform necessary tests, and ask you how things are going. Keep all follow-up visits. This is important.  Ask for help if you have counseling or nutritional needs during pregnancy. Your health care provider can offer advice or refer you to specialists for help with various needs.  Schedule a  dentist appointment. At home, brush your teeth with a soft toothbrush. Floss gently.  Write down your questions. Take them to your prenatal visits. Where to find more information  American Pregnancy Association: americanpregnancy.[place] and Gynecologists: [email or web address]  Office on [employer]: [email or web address] Contact a health care provider if you have:  Dizziness.  A fever.  Mild pelvic cramps, pelvic pressure, or nagging pain in the abdominal area.  Nausea, vomiting, or diarrhea that lasts for 24 hours or longer.  A bad-smelling vaginal discharge.  Pain when you urinate.  Known exposure to a contagious illness, such as chickenpox, measles, Zika virus, HIV, or hepatitis. Get help right away if you have:  Spotting or bleeding from your vagina.  Severe abdominal cramping or pain.  Shortness of breath or chest pain.  Any kind of trauma, such as from a fall or a car crash.  New or increased pain, swelling, or redness in an arm or leg. Summary  The first trimester of pregnancy starts on the first day of your last menstrual  period until the end of week 12 (months 1 through 3).  Eating 4 or 5 small meals a day rather than 3 large meals may help to relieve nausea and vomiting.  Do not use any products that contain nicotine or tobacco, such as cigarettes, e-cigarettes, and chewing tobacco. If you need help quitting, ask your health care provider.  Keep all follow-up visits. This is important. This information is not intended to replace advice given to you by your health care provider. Make sure you discuss any questions you have with your health care provider. Document Revised: 04/29/2020 Document Reviewed: 03/05/2020 Elsevier Patient Education  2021 [place]. Commonly Asked Questions During Pregnancy  Cats: A parasite can be excreted in cat feces.  To avoid exposure you need to have another person empty the little box.  If you must empty the litter box you will need to wear gloves.  Wash your hands after handling your cat.  This parasite can also be found in raw or undercooked meat so this should also be avoided.  Colds, Sore Throats, Flu: Please check your medication sheet to see what you can take for symptoms.  If your symptoms are unrelieved by these medications please call the office.  Dental Work: Most any dental work Investment banker, [employer] recommends is permitted.  X-rays should only be taken during the first trimester if absolutely necessary.  Your abdomen should be shielded with a lead apron during all x-rays.  Please notify your provider prior to receiving any x-rays.  Novocaine is fine; gas is not recommended.  If your dentist requires a note from [place] prior to dental work please call the office and we will provide one for you.  Exercise: Exercise is an important part of staying healthy during your pregnancy.  You may continue most exercises you were accustomed to prior to pregnancy.  Later in your pregnancy you will most likely notice you have difficulty with activities requiring balance like riding a bicycle.  It  is important that you listen to your body and avoid activities that put you at a higher risk of falling.  Adequate rest and staying well hydrated are a must!  If you have questions about the safety of specific activities ask your provider.    Exposure to Children with illness: Try to avoid obvious exposure; report any symptoms to [place] when noted,  If you have chicken pos, red measles or mumps, you should be  immune to these diseases.   Please do not take any vaccines while pregnant unless you have checked with your OB provider.  Fetal Movement: After 28 weeks we recommend you do "kick counts" twice daily.  Lie or sit down in a calm quiet environment and count your baby movements "kicks".  You should feel your baby at least 10 times per hour.  If you have not felt 10 kicks within the first hour get up, walk around and have something sweet to eat or drink then repeat for an additional hour.  If count remains less than 10 per hour notify your provider.  Fumigating: Follow your pest control agent's advice as to how long to stay out of your home.  Ventilate the area well before re-entering.  Hemorrhoids:   Most over-the-counter preparations can be used during pregnancy.  Check your medication to see what is safe to use.  It is important to use a stool softener or fiber in your diet and to drink lots of liquids.  If hemorrhoids seem to be getting worse please call the office.   Hot Tubs:  Hot tubs Jacuzzis and saunas are not recommended while pregnant.  These increase your internal body temperature and should be avoided.  Intercourse:  Sexual intercourse is safe during pregnancy as long as you are comfortable, unless otherwise advised by your provider.  Spotting may occur after intercourse; report any bright red bleeding that is heavier than spotting.  Labor:  If you know that you are in labor, please go to the hospital.  If you are unsure, please call the office and let us help you decide what to  do.  Lifting, straining, etc:  If your job requires heavy lifting or straining please check with your provider for any limitations.  Generally, you should not lift items heavier than that you can lift simply with your hands and arms (no back muscles)  Painting:  Paint fumes do not harm your pregnancy, but may make you ill and should be avoided if possible.  Latex or water based paints have less odor than oils.  Use adequate ventilation while painting.  Permanents & Hair Color:  Chemicals in hair dyes are not recommended as they cause increase hair dryness which can increase hair loss during pregnancy.  " Highlighting" and permanents are allowed.  Dye may be absorbed differently and permanents may not hold as well during pregnancy.  Sunbathing:  Use a sunscreen, as skin burns easily during pregnancy.  Drink plenty of fluids; avoid over heating.  Tanning Beds:  Because their possible side effects are still unknown, tanning beds are not recommended.  Ultrasound Scans:  Routine ultrasounds are performed at approximately 20 weeks.  You will be able to see your baby's general anatomy an if you would like to know the gender this can usually be determined as well.  If it is questionable when you conceived you may also receive an ultrasound early in your pregnancy for dating purposes.  Otherwise ultrasound exams are not routinely performed unless there is a medical necessity.  Although you can request a scan we ask that you pay for it when conducted because insurance does not cover " patient request" scans.  Work: If your pregnancy proceeds without complications you may work until your due date, unless your physician or employer advises otherwise.  Round Ligament Pain/Pelvic Discomfort:  Sharp, shooting pains not associated with bleeding are fairly common, usually occurring in the second trimester of pregnancy.  They tend to be  worse when standing up or when you remain standing for long periods of time.   These are the result of pressure of certain pelvic ligaments called "round ligaments".  Rest, Tylenol and heat seem to be the most effective relief.  As the womb and fetus grow, they rise out of the pelvis and the discomfort improves.  Please notify the office if your pain seems different than that described.  It may represent a more serious condition.  Common Medications Safe in Pregnancy  Acne:      Constipation:  Benzoyl Peroxide     Colace  Clindamycin      Dulcolax Suppository  Topica Erythromycin     Fibercon  Salicylic Acid      Metamucil         Miralax AVOID:        Senakot   Accutane    Cough:  Retin-A       Cough Drops  Tetracycline      Phenergan w/ Codeine if Rx  Minocycline      Robitussin (Plain & DM)  Antibiotics:     Crabs/Lice:  Ceclor       RID  Cephalosporins    AVOID:  E-Mycins      Kwell  Keflex  Macrobid/Macrodantin   Diarrhea:  Penicillin      Kao-Pectate  Zithromax      Imodium AD         PUSH FLUIDS AVOID:       Cipro     Fever:  Tetracycline      Tylenol (Regular or Extra  Minocycline       Strength)  Levaquin      Extra Strength-Do not          Exceed 8 tabs/24 hrs Caffeine:        '200mg'$ /day (equiv. To 1 cup of coffee or  approx. 3 12 oz sodas)         Gas: Cold/Hayfever:       Gas-X  Benadryl      Mylicon  Claritin       Phazyme  **Claritin-D        Chlor-Trimeton    Headaches:  Dimetapp      ASA-Free Excedrin  Drixoral-Non-Drowsy     Cold Compress  Mucinex (Guaifenasin)     Tylenol (Regular or Extra  Sudafed/Sudafed-12 Hour     Strength)  **Sudafed PE Pseudoephedrine   Tylenol Cold & Sinus     Vicks Vapor Rub  Zyrtec  **AVOID if Problems With Blood Pressure         Heartburn: Avoid lying down for at least 1 hour after meals  Aciphex      Maalox     Rash:  Milk of Magnesia     Benadryl    Mylanta       1% Hydrocortisone Cream  Pepcid  Pepcid Complete   Sleep  Aids:  Prevacid      Ambien   Prilosec       Benadryl  Rolaids       Chamomile Tea  Tums (Limit 4/day)     Unisom         Tylenol PM         Warm milk-add vanilla or  Hemorrhoids:       Sugar for taste  Anusol/Anusol H.C.  (RX: Analapram 2.5%)  Sugar Substitutes:  Hydrocortisone OTC     Ok in moderation  Preparation H      Tucks  Vaseline lotion applied to tissue with wiping    Herpes:     Throat:  Acyclovir      Oragel  Famvir  Valtrex     Vaccines:         Flu Shot Leg Cramps:       *Gardasil  Benadryl      Hepatitis A         Hepatitis B Nasal Spray:       Pneumovax  Saline Nasal Spray     Polio Booster         Tetanus Nausea:       Tuberculosis test or PPD  Vitamin B6 25 mg TID   AVOID:    Dramamine      *Gardasil  Emetrol       Live Poliovirus  Ginger Root 250 mg QID    MMR (measles, mumps &  High Complex Carbs @ Bedtime    rebella)  Sea Bands-Accupressure    Varicella (Chickenpox)  Unisom 1/2 tab TID     *No known complications           If received before Pain:         Known pregnancy;   Darvocet       Resume series after  Lortab        Delivery  Percocet    Yeast:   Tramadol      Femstat  Tylenol 3      Gyne-lotrimin  Ultram       Monistat  Vicodin           MISC:         All Sunscreens           Hair Coloring/highlights          Insect Repellant's          (Including DEET)         [facility]  [place], [place], [place] [address]  Phone: [phone]   [facility] (second location)  [address] [place], [place] [address]  Phone: [phone]   [facility]) [place], [place], [place] [address] Phone: [phone]   [address] [place], [place] [address]  Phone: [phone] Sciatica  Sciatica is pain, weakness, tingling, or loss of feeling (numbness) along the sciatic nerve. The sciatic nerve starts in  the lower back and goes down the back of each leg. Sciatica usually goes away on its own or with treatment. Sometimes, sciatica may come back (recur). What are the causes? This condition happens when the sciatic nerve is pinched or has pressure put on it. This may be the result of:  A disk in between the bones of the spine bulging out too far (herniated disk).  Changes in the spinal disks that occur with aging.  A condition that affects a muscle in the butt.  Extra bone growth near the sciatic nerve.  A break (fracture) of the area between your hip bones (pelvis).  Pregnancy.  Tumor. This is rare. What increases the risk? You are more likely to develop this condition if you:  Play sports that put pressure or stress on the spine.  Have poor strength and ease of movement (flexibility).  Have had a back injury in the past.  Have had back surgery.  Sit for long periods of time.  Do activities that involve bending or lifting over and over  again.  Are very overweight (obese). What are the signs or symptoms? Symptoms can vary from mild to very bad. They may include:  Any of these problems in the lower back, leg, hip, or butt: ? Mild tingling, loss of feeling, or dull aches. ? Burning sensations. ? Sharp pains.  Loss of feeling in the back of the calf or the sole of the foot.  Leg weakness.  Very bad back pain that makes it hard to move. These symptoms may get worse when you cough, sneeze, or laugh. They may also get worse when you sit or stand for long periods of time. How is this treated? This condition often gets better without any treatment. However, treatment may include:  Changing or cutting back on physical activity when you have pain.  Doing exercises and stretching.  Putting ice or heat on the affected area.  Medicines that help: ? To relieve pain and swelling. ? To relax your muscles.  Shots (injections) of medicines that help to relieve pain, irritation,  and swelling.  Surgery. Follow these instructions at home: Medicines  Take over-the-counter and prescription medicines only as told by your doctor.  Ask your doctor if the medicine prescribed to you: ? Requires you to avoid driving or using heavy machinery. ? Can cause trouble pooping (constipation). You may need to take these steps to prevent or treat trouble pooping:  Drink enough fluids to keep your pee (urine) pale yellow.  Take over-the-counter or prescription medicines.  Eat foods that are high in fiber. These include beans, whole grains, and fresh fruits and vegetables.  Limit foods that are high in fat and sugar. These include fried or sweet foods. Managing pain  If told, put ice on the affected area. ? Put ice in a plastic bag. ? Place a towel between your skin and the bag. ? Leave the ice on for 20 minutes, 2-3 times a day.  If told, put heat on the affected area. Use the heat source that your doctor tells you to use, such as a moist heat pack or a heating pad. ? Place a towel between your skin and the heat source. ? Leave the heat on for 20-30 minutes. ? Remove the heat if your skin turns bright red. This is very important if you are unable to feel pain, heat, or cold. You may have a greater risk of getting burned.      Activity  Return to your normal activities as told by your doctor. Ask your doctor what activities are safe for you.  Avoid activities that make your symptoms worse.  Take short rests during the day. ? When you rest for a long time, do some physical activity or stretching between periods of rest. ? Avoid sitting for a long time without moving. Get up and move around at least one time each hour.  Exercise and stretch regularly, as told by your doctor.  Do not lift anything that is heavier than 10 lb (4.5 kg) while you have symptoms of sciatica. ? Avoid lifting heavy things even when you do not have symptoms. ? Avoid lifting heavy things over and  over.  When you lift objects, always lift in a way that is safe for your body. To do this, you should: ? Bend your knees. ? Keep the object close to your body. ? Avoid twisting.   General instructions  Stay at a healthy weight.  Wear comfortable shoes that support your feet. Avoid wearing high heels.  Avoid sleeping  on a mattress that is too soft or too hard. You might have less pain if you sleep on a mattress that is firm enough to support your back.  Keep all follow-up visits as told by your doctor. This is important. Contact a doctor if:  You have pain that: ? Wakes you up when you are sleeping. ? Gets worse when you lie down. ? Is worse than the pain you have had in the past. ? Lasts longer than 4 weeks.  You lose weight without trying. Get help right away if:  You cannot control when you pee (urinate) or poop (have a bowel movement).  You have weakness in any of these areas and it gets worse: ? Lower back. ? The area between your hip bones. ? Butt. ? Legs.  You have redness or swelling of your back.  You have a burning feeling when you pee. Summary  Sciatica is pain, weakness, tingling, or loss of feeling (numbness) along the sciatic nerve.  This condition happens when the sciatic nerve is pinched or has pressure put on it.  Sciatica can cause pain, tingling, or loss of feeling (numbness) in the lower back, legs, hips, and butt.  Treatment often includes rest, exercise, medicines, and putting ice or heat on the affected area. This information is not intended to replace advice given to you by your health care provider. Make sure you discuss any questions you have with your health care provider. Document Revised: 12/10/2018 Document Reviewed: 12/10/2018 Elsevier Patient Education  [place].

## 2021-02-26 NOTE — Progress Notes (Signed)
Valerie Garner [place] presents for [employer] nurse interview visit. Pregnancy confirmation done _2/25/2022- with JML.  G- 2.  P-1001. U/S done on 02/10/2021 showed SIUP at 6 6/7 with EDD- 09/30/2021. Working EDD based on u/s.  Pregnancy education material explained and given. _0__ cats in the home. NOB labs ordered. TSH/HbgA1c due to Increased BMI- Body mass index is 40.34 kg/m.  HIV labs and Drug screen were explained ordered.  PNV encouraged. Genetic screening options discussed. Genetic testing: to be ordered at NOB PE. Gender results to be given to        husband(Stephen).     Last pap 2019- neg. Considering covid 19 vaccine- education provided. Mild nausea - no meds needed at this time. Hx of  PP depression. PHQ9- 2. GAD- 0. Financial policy reviewed. FMLA form reviewed and signed.  Pt. To follow up with provider in _3_ weeks for NOB physical.  All questions answered.  Depression screen [facility] 2/9 02/26/2021 10/09/2019  Decreased Interest 0 0  Down, Depressed, Hopeless 0 0  PHQ - 2 Score 0 0  Altered sleeping 1 1  Tired, decreased energy 1 2  Change in appetite 0 0  Feeling bad or failure about yourself  0 0  Trouble concentrating 0 0  Moving slowly or fidgety/restless 0 0  Suicidal thoughts 0 0  PHQ-9 Score 2 3  Difficult doing work/chores - Not difficult at all   GAD 7 : Generalized Anxiety Score 02/26/2021  Nervous, Anxious, on Edge 0  Control/stop worrying 0  Worry too much - different things 0  Trouble relaxing 0  Restless 0  Easily annoyed or irritable 0  Afraid - awful might happen 0  Total GAD 7 Score 0

## 2021-02-27 LAB — MICROSCOPIC EXAMINATION
Casts: NONE SEEN /lpf
Epithelial Cells (non renal): >10 /hpf — AB (ref 0–10)

## 2021-02-27 LAB — URINALYSIS, ROUTINE W REFLEX MICROSCOPIC
Bilirubin, UA: NEGATIVE
Glucose, UA: NEGATIVE
Ketones, UA: NEGATIVE
Nitrite, UA: NEGATIVE
Protein,UA: NEGATIVE
RBC, UA: NEGATIVE
Specific Gravity, UA: 1.021 (ref 1.005–1.030)
Urobilinogen, Ur: 0.2 mg/dL (ref 0.2–1.0)
pH, UA: 6 (ref 5.0–7.5)

## 2021-02-28 LAB — CULTURE, OB URINE

## 2021-02-28 LAB — GC/CHLAMYDIA PROBE AMP
Chlamydia trachomatis, NAA: NEGATIVE
Neisseria Gonorrhoeae by PCR: NEGATIVE

## 2021-02-28 LAB — URINE CULTURE, OB REFLEX

## 2021-03-01 LAB — MONITOR DRUG PROFILE 14(MW)
Amphetamine Scrn, Ur: NEGATIVE ng/mL
BARBITURATE SCREEN URINE: NEGATIVE ng/mL
BENZODIAZEPINE SCREEN, URINE: NEGATIVE ng/mL
Buprenorphine, Urine: NEGATIVE ng/mL
CANNABINOIDS UR QL SCN: NEGATIVE ng/mL
Cocaine (Metab) Scrn, Ur: NEGATIVE ng/mL
Creatinine(Crt), U: 135.2 mg/dL (ref 20.0–300.0)
Fentanyl, Urine: NEGATIVE pg/mL
Meperidine Screen, Urine: NEGATIVE ng/mL
Methadone Screen, Urine: NEGATIVE ng/mL
OXYCODONE+OXYMORPHONE UR QL SCN: NEGATIVE ng/mL
Opiate Scrn, Ur: NEGATIVE ng/mL
Ph of Urine: 6.8 (ref 4.5–8.9)
Phencyclidine Qn, Ur: NEGATIVE ng/mL
Propoxyphene Scrn, Ur: NEGATIVE ng/mL
SPECIFIC GRAVITY: 1.029
Tramadol Screen, Urine: NEGATIVE ng/mL

## 2021-03-01 LAB — NICOTINE SCREEN, URINE: Cotinine Ql Scrn, Ur: NEGATIVE ng/mL

## 2021-03-02 LAB — VIRAL HEPATITIS HBV, HCV
HCV Ab: <0.1 s/co ratio (ref 0.0–0.9)
Hep B Core Total Ab: NEGATIVE
Hep B Surface Ab, Qual: REACTIVE
Hepatitis B Surface Ag: NEGATIVE

## 2021-03-02 LAB — HIV ANTIBODY (ROUTINE TESTING W REFLEX): HIV Screen 4th Generation wRfx: NONREACTIVE

## 2021-03-02 LAB — ABO AND RH: Rh Factor: POSITIVE

## 2021-03-02 LAB — TSH: TSH: 1.97 u[IU]/mL (ref 0.450–4.500)

## 2021-03-02 LAB — RPR: RPR Ser Ql: NONREACTIVE

## 2021-03-02 LAB — RUBELLA SCREEN: Rubella Antibodies, IGG: 4.61 index (ref >0.99)

## 2021-03-02 LAB — HEMOGLOBIN A1C
Est. average glucose Bld gHb Est-mCnc: 100 mg/dL
Hgb A1c MFr Bld: 5.1 % (ref 4.8–5.6)

## 2021-03-02 LAB — VARICELLA ZOSTER ANTIBODY, IGG: Varicella zoster IgG: 174 index (ref >165)

## 2021-03-02 LAB — HCV INTERPRETATION

## 2021-03-02 LAB — ANTIBODY SCREEN: Antibody Screen: NEGATIVE

## 2021-03-03 ENCOUNTER — Encounter: Payer: Self-pay | Admitting: Certified Nurse Midwife

## 2021-03-03 DIAGNOSIS — Z6710 Type A blood, Rh positive: Secondary | ICD-10-CM | POA: Insufficient documentation

## 2021-03-12 ENCOUNTER — Ambulatory Visit (INDEPENDENT_AMBULATORY_CARE_PROVIDER_SITE_OTHER): Payer: 59 | Admitting: Certified Nurse Midwife

## 2021-03-12 ENCOUNTER — Other Ambulatory Visit: Payer: Self-pay

## 2021-03-12 ENCOUNTER — Encounter: Payer: Self-pay | Admitting: Certified Nurse Midwife

## 2021-03-12 VITALS — BP 137/85 | HR 101 | Wt 233.2 lb

## 2021-03-12 DIAGNOSIS — Z3481 Encounter for supervision of other normal pregnancy, first trimester: Secondary | ICD-10-CM | POA: Diagnosis not present

## 2021-03-12 DIAGNOSIS — Z3A11 11 weeks gestation of pregnancy: Secondary | ICD-10-CM

## 2021-03-12 DIAGNOSIS — Z3401 Encounter for supervision of normal first pregnancy, first trimester: Secondary | ICD-10-CM | POA: Diagnosis not present

## 2021-03-12 DIAGNOSIS — Z130 Encounter for screening for diseases of the blood and blood-forming organs and certain disorders involving the immune mechanism: Secondary | ICD-10-CM

## 2021-03-12 DIAGNOSIS — Z1379 Encounter for other screening for genetic and chromosomal anomalies: Secondary | ICD-10-CM

## 2021-03-12 DIAGNOSIS — O99210 Obesity complicating pregnancy, unspecified trimester: Secondary | ICD-10-CM

## 2021-03-12 DIAGNOSIS — Z3A10 10 weeks gestation of pregnancy: Secondary | ICD-10-CM

## 2021-03-12 LAB — POCT URINALYSIS DIPSTICK OB
Appearance: NORMAL
Bilirubin, UA: NEGATIVE
Blood, UA: NEGATIVE
Glucose, UA: NEGATIVE
Ketones, UA: NEGATIVE
Leukocytes, UA: NEGATIVE
Nitrite, UA: NEGATIVE
Odor: NORMAL
POC,PROTEIN,UA: NEGATIVE
Spec Grav, UA: 1.02 (ref 1.010–1.025)
Urobilinogen, UA: 0.2 E.U./dL
pH, UA: 6.5 (ref 5.0–8.0)

## 2021-03-12 MED ORDER — ASPIRIN EC 81 MG PO TBEC: 81.0000 mg | DELAYED_RELEASE_TABLET | Freq: Every day | ORAL | 11 refills | Status: DC

## 2021-03-12 NOTE — Progress Notes (Signed)
ROB: She said she is having round ligament pain, otherwise she is doing.

## 2021-03-12 NOTE — Progress Notes (Signed)
I have seen, interviewed, and examined the patient in conjunction with the [employer] and affirm the diagnosis and management plan.   Gunnar Bulla, CNM [facility], [facility] 03/12/21 12:59 PM

## 2021-03-12 NOTE — Progress Notes (Signed)
NEW OB HISTORY AND PHYSICAL  SUBJECTIVE:       Valerie Garner is a 25 y.o. G2P1001 female, Patient's last menstrual period was 12/14/2020 (approximate)., Estimated Date of Delivery: 09/30/21, [redacted]w[redacted]d, presents today for establishment of Prenatal Care.  She has no unusual complaints and complains of headache, round ligament pain, and nausea with rarely vomiting (maybe ten (10) x during pregnancy). Using home treatment measures with relief.  Denies difficulty breathing, respiratory distress, chest pain, vaginal bleeding or cramping, and leg pain or swelling.   Gynecologic History  Patient's last menstrual period was 12/14/2020 (approximate).   Contraception: none   Last Pap: 10/02/2018. Results were: Neg  Obstetric History  OB History  Gravida Para Term Preterm AB Living  2 1 1     1   SAB IAB Ectopic Multiple Live Births        0 1    # Outcome Date GA Lbr Len/2nd Weight Sex Delivery Anes PTL Lv  2 Current           1 Term 08/22/19 [redacted]w[redacted]d 10:35 / 00:48 2950 g M Vag-Spont EPI N LIV    Past Medical History:  Diagnosis Date  . Postpartum depression associated with first pregnancy     Past Surgical History:  Procedure Laterality Date  . adenoidectomy    . TONSILLECTOMY Bilateral   . WISDOM TOOTH EXTRACTION      Current Outpatient Medications on File Prior to Visit  Medication Sig Dispense Refill  . [DISCONTINUED] sertraline (ZOLOFT) 50 MG tablet Take 1 tablet (50 mg total) by mouth daily. (Patient not taking: No sig reported) 30 tablet 1   No current facility-administered medications on file prior to visit.    No Known Allergies  Social History   Socioeconomic History  . Marital status: Married    Spouse name: Not on file  . Number of children: Not on file  . Years of education: Not on file  . Highest education level: Not on file  Occupational History  . Not on file  Tobacco Use  . Smoking status: Never Smoker  . Smokeless tobacco: Never Used  Vaping  Use  . Vaping Use: Never used  Substance and Sexual Activity  . Alcohol use: Not Currently  . Drug use: Never  . Sexual activity: Yes    Birth control/protection: None  Other Topics Concern  . Not on file  Social History Narrative  . Not on file   Social Determinants of Health   Financial Resource Strain: Not on file  Food Insecurity: Not on file  Transportation Needs: Not on file  Physical Activity: Not on file  Stress: Not on file  Social Connections: Not on file  Intimate Partner Violence: Not on file    Family History  Problem Relation Age of Onset  . Breast cancer Neg Hx   . Ovarian cancer Neg Hx   . Colon cancer Neg Hx     The following portions of the patient's history were reviewed and updated as appropriate: allergies, current medications, past OB history, past medical history, past surgical history, past family history, past social history, and problem list.  REVIEW OF SYSTEMS:   ROS- Negative other then what was reported above. Information obtained verbally from patient.   OBJECTIVE:  Initial Physical Exam (New OB)  BP 137/85   Pulse (!) 101   Wt 105.8 kg   LMP 12/14/2020 (Approximate)   BMI 40.03 kg/m   GENERAL APPEARANCE: alert, well appearing, in no apparent  distress, oriented to person, place and time, overweight, well hydrated  HEAD: normocephalic, atraumatic  THYROID: no thyromegaly or masses present  BREASTS: no masses noted, no significant tenderness, no palpable axillary nodes, no skin changes  LUNGS: clear to auscultation, no wheezes, rales or rhonchi, symmetric air entry  HEART: regular rate and rhythm, no murmurs  ABDOMEN: soft, nontender, nondistended, no abnormal masses, no epigastric pain, obese, fundus soft, nontender 11 weeks size and FHT visualized via ultrasound performed by MD.  EXTREMITIES: no redness or tenderness in the calves or thighs, no edema  SKIN: normal coloration and turgor, no rashes   NEUROLOGIC: alert,  oriented, normal speech, no focal findings or movement disorder noted  PELVIC EXAM UTERUS: gravid and consistent with 11 weeks  ASSESSMENT:  Supervision of other normal pregnancy in the first trimester  [redacted] weeks gestation  Encounter for genetic screen  Screen for anemia, iron deficiency   Obesity in pregnancy  PLAN:  Labs: Genetic and anemia screen, see orders.   Rx: Aspirin to start at [redacted] weeks gestation, see orders  Encouraged to continue home treatment measures and to let us know if anything changes.  Anticipatory guidance regarding course of prenatal care.   Reviewed red flag symptoms and when to call.  RTC x 4 weeks for ROB with ANNIE or sooner if needed  Juliann Pares, Student-MidWife  [employer] 03/12/21 12:17 PM

## 2021-03-12 NOTE — Patient Instructions (Addendum)
Round Ligament Pain  The round ligament is a cord of muscle and tissue that helps support the uterus. It can become a source of pain during pregnancy if it becomes stretched or twisted as the baby grows. The pain usually begins in the second trimester (13-28 weeks) of pregnancy, and it can come and go until the baby is delivered. It is not a serious problem, and it does not cause harm to the baby. Round ligament pain is usually a short, sharp, and pinching pain, but it can also be a dull, lingering, and aching pain. The pain is felt in the lower side of the abdomen or in the groin. It usually starts deep in the groin and moves up to the outside of the hip area. The pain may occur when you:  Suddenly change position, such as quickly going from a sitting to standing position.  Roll over in bed.  Cough or sneeze.  Do physical activity. Follow these instructions at home:  Watch your condition for any changes.  When the pain starts, relax. Then try any of these methods to help with the pain: ? Sitting down. ? Flexing your knees up to your abdomen. ? Lying on your side with one pillow under your abdomen and another pillow between your legs. ? Sitting in a warm bath for 15-20 minutes or until the pain goes away.  Take over-the-counter and prescription medicines only as told by your health care provider.  Move slowly when you sit down or stand up.  Avoid long walks if they cause pain.  Stop or reduce your physical activities if they cause pain.  Keep all follow-up visits as told by your health care provider. This is important.   Contact a health care provider if:  Your pain does not go away with treatment.  You feel pain in your back that you did not have before.  Your medicine is not helping. Get help right away if:  You have a fever or chills.  You develop uterine contractions.  You have vaginal bleeding.  You have nausea or vomiting.  You have diarrhea.  You have pain  when you urinate. Summary  Round ligament pain is felt in the lower abdomen or groin. It is usually a short, sharp, and pinching pain. It can also be a dull, lingering, and aching pain.  This pain usually begins in the second trimester (13-28 weeks). It occurs because the uterus is stretching with the growing baby, and it is not harmful to the baby.  You may notice the pain when you suddenly change position, when you cough or sneeze, or during physical activity.  Relaxing, flexing your knees to your abdomen, lying on one side, or taking a warm bath may help to get rid of the pain.  Get help from your health care provider if the pain does not go away or if you have vaginal bleeding, nausea, vomiting, diarrhea, or painful urination. This information is not intended to replace advice given to you by your health care provider. Make sure you discuss any questions you have with your health care provider. Document Revised: 05/09/2018 Document Reviewed: 05/09/2018 Elsevier Patient Education  [place].   Common Medications Safe in Pregnancy  Acne:      Constipation:  Benzoyl Peroxide     Colace  Clindamycin      Dulcolax Suppository  Topica Erythromycin     Fibercon  Salicylic Acid      Metamucil  Miralax AVOID:        Senakot   Accutane    Cough:  Retin-A       Cough Drops  Tetracycline      Phenergan w/ Codeine if Rx  Minocycline      Robitussin (Plain & DM)  Antibiotics:     Crabs/Lice:  Ceclor       RID  Cephalosporins    AVOID:  E-Mycins      Kwell  Keflex  Macrobid/Macrodantin   Diarrhea:  Penicillin      Kao-Pectate  Zithromax      Imodium AD         PUSH FLUIDS AVOID:       Cipro     Fever:  Tetracycline      Tylenol (Regular or Extra  Minocycline       Strength)  Levaquin      Extra Strength-Do not          Exceed 8 tabs/24 hrs Caffeine:        <259m/day (equiv. To 1 cup of coffee or  approx. 3 12 oz  sodas)         Gas: Cold/Hayfever:       Gas-X  Benadryl      Mylicon  Claritin       Phazyme  **Claritin-D        Chlor-Trimeton    Headaches:  Dimetapp      ASA-Free Excedrin  Drixoral-Non-Drowsy     Cold Compress  Mucinex (Guaifenasin)     Tylenol (Regular or Extra  Sudafed/Sudafed-12 Hour     Strength)  **Sudafed PE Pseudoephedrine   Tylenol Cold & Sinus     Vicks Vapor Rub  Zyrtec  **AVOID if Problems With Blood Pressure         Heartburn: Avoid lying down for at least 1 hour after meals  Aciphex      Maalox     Rash:  Milk of Magnesia     Benadryl    Mylanta       1% Hydrocortisone Cream  Pepcid  Pepcid Complete   Sleep Aids:  Prevacid      Ambien   Prilosec       Benadryl  Rolaids       Chamomile Tea  Tums (Limit 4/day)     Unisom         Tylenol PM         Warm milk-add vanilla or  Hemorrhoids:       Sugar for taste  Anusol/Anusol H.C.  (RX: Analapram 2.5%)  Sugar Substitutes:  Hydrocortisone OTC     Ok in moderation  Preparation H      Tucks        Vaseline lotion applied to tissue with wiping    Herpes:     Throat:  Acyclovir      Oragel  Famvir  Valtrex     Vaccines:         Flu Shot Leg Cramps:       *Gardasil  Benadryl      Hepatitis A         Hepatitis B Nasal Spray:       Pneumovax  Saline Nasal Spray     Polio Booster         Tetanus Nausea:       Tuberculosis test or PPD  Vitamin B6 25 mg TID   AVOID:    Dramamine      *  Gardasil  Emetrol       Live Poliovirus  Ginger Root 250 mg QID    MMR (measles, mumps &  High Complex Carbs @ Bedtime    rebella)  Sea Bands-Accupressure    Varicella (Chickenpox)  Unisom 1/2 tab TID     *No known complications           If received before Pain:         Known pregnancy;   Darvocet       Resume series after  Lortab        Delivery  Percocet    Yeast:   Tramadol      Femstat  Tylenol 3      Gyne-lotrimin  Ultram       Monistat  Vicodin           MISC:         All Sunscreens           Hair  Coloring/highlights          Insect Repellant's          (Including DEET)         Mystic Tans    [email or web address]">  First Trimester of Pregnancy  The first trimester of pregnancy starts on the first day of your last menstrual period until the end of week 12. This is also called months 1 through 3 of pregnancy. Body changes during your first trimester Your body goes through many changes during pregnancy. The changes usually return to normal after your baby is born. Physical changes  You may gain or lose weight.  Your breasts may grow larger and hurt. The area around your nipples may get darker.  Dark spots or blotches may develop on your face.  You may have changes in your hair. Health changes  You may feel like you might vomit (nauseous), and you may vomit.  You may have heartburn.  You may have headaches.  You may have trouble pooping (constipation).  Your gums may bleed. Other changes  You may get tired easily.  You may pee (urinate) more often.  Your menstrual periods will stop.  You may not feel hungry.  You may want to eat certain kinds of food.  You may have changes in your emotions from day to day.  You may have more dreams. Follow these instructions at home: Medicines  Take over-the-counter and prescription medicines only as told by your doctor. Some medicines are not safe during pregnancy.  Take a prenatal vitamin that contains at least 600 micrograms (mcg) of folic acid. Eating and drinking  Eat healthy meals that include: ? Fresh fruits and vegetables. ? Whole grains. ? Good sources of protein, such as meat, eggs, or tofu. ? Low-fat dairy products.  Avoid raw meat and unpasteurized juice, milk, and cheese.  If you feel like you may vomit, or you vomit: ? Eat 4 or 5 small meals a day instead of 3 large meals. ? Try eating a few soda crackers. ? Drink liquids between meals instead of during meals.  You may  need to take these actions to prevent or treat trouble pooping: ? Drink enough fluids to keep your pee (urine) pale yellow. ? Eat foods that are high in fiber. These include beans, whole grains, and fresh fruits and vegetables. ? Limit foods that are high in fat and sugar. These include fried or sweet foods. Activity  Exercise only as told by your doctor. Most people can do their usual  exercise routine during pregnancy.  Stop exercising if you have cramps or pain in your lower belly (abdomen) or low back.  Do not exercise if it is too hot or too humid, or if you are in a place of great height (high altitude).  Avoid heavy lifting.  If you choose to, you may have sex unless your doctor tells you not to. Relieving pain and discomfort  Wear a good support bra if your breasts are sore.  Rest with your legs raised (elevated) if you have leg cramps or low back pain.  If you have bulging veins (varicose veins) in your legs: ? Wear support hose as told by your doctor. ? Raise your feet for 15 minutes, 3-4 times a day. ? Limit salt in your food. Safety  Wear your seat belt at all times when you are in a car.  Talk with your doctor if someone is hurting you or yelling at you.  Talk with your doctor if you are feeling sad or have thoughts of hurting yourself. Lifestyle  Do not use hot tubs, steam rooms, or saunas.  Do not douche. Do not use tampons or scented sanitary pads.  Do not use herbal medicines, illegal drugs, or medicines that are not approved by your doctor. Do not drink alcohol.  Do not smoke or use any products that contain nicotine or tobacco. If you need help quitting, ask your doctor.  Avoid cat litter boxes and soil that is used by cats. These carry germs that can cause harm to the baby and can cause a loss of your baby by miscarriage or stillbirth. General instructions  Keep all follow-up visits. This is important.  Ask for help if you need counseling or if you  need help with nutrition. Your doctor can give you advice or tell you where to go for help.  Visit your dentist. At home, brush your teeth with a soft toothbrush. Floss gently.  Write down your questions. Take them to your prenatal visits. Where to find more information  American Pregnancy Association: americanpregnancy.org  [employer]: www.acog.org  Office on Women's Health: KeywordPortfolios.com.br Contact a doctor if:  You are dizzy.  You have a fever.  You have mild cramps or pressure in your lower belly.  You have a nagging pain in your belly area.  You continue to feel like you may vomit, you vomit, or you have watery poop (diarrhea) for 24 hours or longer.  You have a bad-smelling fluid coming from your vagina.  You have pain when you pee.  You are exposed to a disease that spreads from person to person, such as chickenpox, measles, Zika virus, HIV, or hepatitis. Get help right away if:  You have spotting or bleeding from your vagina.  You have very bad belly cramping or pain.  You have shortness of breath or chest pain.  You have any kind of injury, such as from a fall or a car crash.  You have new or increased pain, swelling, or redness in an arm or leg. Summary  The first trimester of pregnancy starts on the first day of your last menstrual period until the end of week 12 (months 1 through 3).  Eat 4 or 5 small meals a day instead of 3 large meals.  Do not smoke or use any products that contain nicotine or tobacco. If you need help quitting, ask your doctor.  Keep all follow-up visits. This information is not intended to replace advice  given to you by your health care provider. Make sure you discuss any questions you have with your health care provider. Document Revised: 04/29/2020 Document Reviewed: 03/05/2020 Elsevier Patient Education  2021 [employer].

## 2021-03-13 LAB — CBC
Hematocrit: 34.7 % (ref 34.0–46.6)
Hemoglobin: 12 g/dL (ref 11.1–15.9)
MCH: 30 pg (ref 26.6–33.0)
MCHC: 34.6 g/dL (ref 31.5–35.7)
MCV: 87 fL (ref 79–97)
Platelets: 340 10*3/uL (ref 150–450)
RBC: 4 x10E6/uL (ref 3.77–5.28)
RDW: 12.2 % (ref 11.7–15.4)
WBC: 10.3 10*3/uL (ref 3.4–10.8)

## 2021-03-15 ENCOUNTER — Telehealth: Payer: Self-pay | Admitting: Certified Nurse Midwife

## 2021-03-15 NOTE — Telephone Encounter (Signed)
Valerie Garner called in and states that when she went to the pharmacy to pick up her medication, the pharmacist told her that they didn't fill the baby aspirin because it was something she could get over the counter.  Valerie Garner wanted to know if it is ok to get baby aspirin over the counter even though it was called in as a prescription.  Also, Valerie Garner would like to know when should she start taking the aspirin.  Please advise.

## 2021-03-15 NOTE — Telephone Encounter (Signed)
Patient called. Told her it was ok to get OTC and start taking at 12 weeks. She verbalized understanding.

## 2021-03-29 ENCOUNTER — Telehealth: Payer: Self-pay

## 2021-03-29 NOTE — Telephone Encounter (Signed)
Called patient husband to reveal gender of little baby girl and low risk results.

## 2021-04-05 ENCOUNTER — Other Ambulatory Visit: Payer: Self-pay

## 2021-04-05 ENCOUNTER — Other Ambulatory Visit: Payer: Self-pay | Admitting: Certified Nurse Midwife

## 2021-04-05 ENCOUNTER — Ambulatory Visit (INDEPENDENT_AMBULATORY_CARE_PROVIDER_SITE_OTHER): Payer: 59 | Admitting: Certified Nurse Midwife

## 2021-04-05 ENCOUNTER — Encounter: Payer: 59 | Admitting: Certified Nurse Midwife

## 2021-04-05 ENCOUNTER — Encounter: Payer: Self-pay | Admitting: Certified Nurse Midwife

## 2021-04-05 VITALS — BP 132/82 | HR 98 | Wt 234.1 lb

## 2021-04-05 DIAGNOSIS — Z3482 Encounter for supervision of other normal pregnancy, second trimester: Secondary | ICD-10-CM

## 2021-04-05 DIAGNOSIS — Z3A14 14 weeks gestation of pregnancy: Secondary | ICD-10-CM

## 2021-04-05 LAB — POCT URINALYSIS DIPSTICK OB
Bilirubin, UA: NEGATIVE
Blood, UA: NEGATIVE
Glucose, UA: NEGATIVE
Leukocytes, UA: NEGATIVE
Nitrite, UA: NEGATIVE
POC,PROTEIN,UA: NEGATIVE
Spec Grav, UA: 1.01
Urobilinogen, UA: 0.2 U/dL
pH, UA: 6.5

## 2021-04-05 NOTE — Patient Instructions (Signed)
Second Trimester of Pregnancy  The second trimester of pregnancy is from week 13 through week 27. This is also called months 4 through 6 of pregnancy. This is often the time when you feel your best. During the second trimester:  Morning sickness is less or has stopped.  You may have more energy.  You may feel hungry more often. At this time, your unborn baby (fetus) is growing very fast. At the end of the sixth month, the unborn baby may be up to 12 inches long and weigh about 1 pounds. You will likely start to feel the baby move between 16 and 20 weeks of pregnancy. Body changes during your second trimester Your body continues to go through many changes during this time. The changes vary and generally return to normal after the baby is born. Physical changes  You will gain more weight.  You may start to get stretch marks on your hips, belly (abdomen), and breasts.  Your breasts will grow and may hurt.  Dark spots or blotches may develop on your face.  A dark line from your belly button to the pubic area (linea nigra) may appear.  You may have changes in your hair. Health changes  You may have headaches.  You may have heartburn.  You may have trouble pooping (constipation).  You may have hemorrhoids or swollen, bulging veins (varicose veins).  Your gums may bleed.  You may pee (urinate) more often.  You may have back pain. Follow these instructions at home: Medicines  Take over-the-counter and prescription medicines only as told by your doctor. Some medicines are not safe during pregnancy.  Take a prenatal vitamin that contains at least 600 micrograms (mcg) of folic acid. Eating and drinking  Eat healthy meals that include: ? Fresh fruits and vegetables. ? Whole grains. ? Good sources of protein, such as meat, eggs, or tofu. ? Low-fat dairy products.  Avoid raw meat and unpasteurized juice, milk, and cheese.  You may need to take these actions to prevent or  treat trouble pooping: ? Drink enough fluids to keep your pee (urine) pale yellow. ? Eat foods that are high in fiber. These include beans, whole grains, and fresh fruits and vegetables. ? Limit foods that are high in fat and sugar. These include fried or sweet foods. Activity  Exercise only as told by your doctor. Most people can do their usual exercise during pregnancy. Try to exercise for 30 minutes at least 5 days a week.  Stop exercising if you have pain or cramps in your belly or lower back.  Do not exercise if it is too hot or too humid, or if you are in a place of great height (high altitude).  Avoid heavy lifting.  If you choose to, you may have sex unless your doctor tells you not to. Relieving pain and discomfort  Wear a good support bra if your breasts are sore.  Take warm water baths (sitz baths) to soothe pain or discomfort caused by hemorrhoids. Use hemorrhoid cream if your doctor approves.  Rest with your legs raised (elevated) if you have leg cramps or low back pain.  If you develop bulging veins in your legs: ? Wear support hose as told by your doctor. ? Raise your feet for 15 minutes, 3-4 times a day. ? Limit salt in your food. Safety  Wear your seat belt at all times when you are in a car.  Talk with your doctor if someone is hurting you or yelling  at you a lot. Lifestyle  Do not use hot tubs, steam rooms, or saunas.  Do not douche. Do not use tampons or scented sanitary pads.  Avoid cat litter boxes and soil used by cats. These carry germs that can harm your baby and can cause a loss of your baby by miscarriage or stillbirth.  Do not use herbal medicines, illegal drugs, or medicines that are not approved by your doctor. Do not drink alcohol.  Do not smoke or use any products that contain nicotine or tobacco. If you need help quitting, ask your doctor. General instructions  Keep all follow-up visits. This is important.  Ask your doctor about local  prenatal classes.  Ask your doctor about the right foods to eat or for help finding a counselor. Where to find more information  American Pregnancy Association: americanpregnancy.org  [employer]: www.acog.org  Office on [employer]: KeywordPortfolios.com.br Contact a doctor if:  You have a headache that does not go away when you take medicine.  You have changes in how you see, or you see spots in front of your eyes.  You have mild cramps, pressure, or pain in your lower belly.  You continue to feel like you may vomit (nauseous), you vomit, or you have watery poop (diarrhea).  You have bad-smelling fluid coming from your vagina.  You have pain when you pee or your pee smells bad.  You have very bad swelling of your face, hands, ankles, feet, or legs.  You have a fever. Get help right away if:  You are leaking fluid from your vagina.  You have spotting or bleeding from your vagina.  You have very bad belly cramping or pain.  You have trouble breathing.  You have chest pain.  You faint.  You have not felt your baby move for the time period told by your doctor.  You have new or increased pain, swelling, or redness in an arm or leg. Summary  The second trimester of pregnancy is from week 13 through week 27 (months 4 through 6).  Eat healthy meals.  Exercise as told by your doctor. Most people can do their usual exercise during pregnancy.  Do not use herbal medicines, illegal drugs, or medicines that are not approved by your doctor. Do not drink alcohol.  Call your doctor if you get sick or if you notice anything unusual about your pregnancy. This information is not intended to replace advice given to you by your health care provider. Make sure you discuss any questions you have with your health care provider. Document Revised: 04/29/2020 Document Reviewed: 03/05/2020 Elsevier Patient Education  [place].    Common Medications Safe in Pregnancy  Acne:      Constipation:  Benzoyl Peroxide     Colace  Clindamycin      Dulcolax Suppository  Topica Erythromycin     Fibercon  Salicylic Acid      Metamucil         Miralax AVOID:        Senakot   Accutane    Cough:  Retin-A       Cough Drops  Tetracycline      Phenergan w/ Codeine if Rx  Minocycline      Robitussin (Plain & DM)  Antibiotics:     Crabs/Lice:  Ceclor       RID  Cephalosporins    AVOID:  E-Mycins      Kwell  Keflex  Macrobid/Macrodantin  Diarrhea:  Penicillin      Kao-Pectate  Zithromax      Imodium AD         PUSH FLUIDS AVOID:       Cipro     Fever:  Tetracycline      Tylenol (Regular or Extra  Minocycline       Strength)  Levaquin      Extra Strength-Do not          Exceed 8 tabs/24 hrs Caffeine:        <249m/day (equiv. To 1 cup of coffee or  approx. 3 12 oz sodas)         Gas: Cold/Hayfever:       Gas-X  Benadryl      Mylicon  Claritin       Phazyme  **Claritin-D        Chlor-Trimeton    Headaches:  Dimetapp      ASA-Free Excedrin  Drixoral-Non-Drowsy     Cold Compress  Mucinex (Guaifenasin)     Tylenol (Regular or Extra  Sudafed/Sudafed-12 Hour     Strength)  **Sudafed PE Pseudoephedrine   Tylenol Cold & Sinus     Vicks Vapor Rub  Zyrtec  **AVOID if Problems With Blood Pressure         Heartburn: Avoid lying down for at least 1 hour after meals  Aciphex      Maalox     Rash:  Milk of Magnesia     Benadryl    Mylanta       1% Hydrocortisone Cream  Pepcid  Pepcid Complete   Sleep Aids:  Prevacid      Ambien   Prilosec       Benadryl  Rolaids       Chamomile Tea  Tums (Limit 4/day)     Unisom         Tylenol PM         Warm milk-add vanilla or  Hemorrhoids:       Sugar for taste  Anusol/Anusol H.C.  (RX: Analapram 2.5%)  Sugar Substitutes:  Hydrocortisone OTC     Ok in moderation  Preparation H      Tucks        Vaseline lotion applied to tissue with  wiping    Herpes:     Throat:  Acyclovir      Oragel  Famvir  Valtrex     Vaccines:         Flu Shot Leg Cramps:       *Gardasil  Benadryl      Hepatitis A         Hepatitis B Nasal Spray:       Pneumovax  Saline Nasal Spray     Polio Booster         Tetanus Nausea:       Tuberculosis test or PPD  Vitamin B6 25 mg TID   AVOID:    Dramamine      *Gardasil  Emetrol       Live Poliovirus  Ginger Root 250 mg QID    MMR (measles, mumps &  High Complex Carbs @ Bedtime    rebella)  Sea Bands-Accupressure    Varicella (Chickenpox)  Unisom 1/2 tab TID     *No known complications           If received before Pain:         Known pregnancy;   Darvocet  Resume series after  Lortab        Delivery  Percocet    Yeast:   Tramadol      Femstat  Tylenol 3      Gyne-lotrimin  Ultram       Monistat  Vicodin           MISC:         All Sunscreens           Hair Coloring/highlights          Insect Repellant's          (Including DEET)         Mystic Tans

## 2021-04-05 NOTE — Progress Notes (Signed)
OB-Pt present for routine prenatal care. Pt stated that she was doing well.  

## 2021-04-05 NOTE — Progress Notes (Signed)
ROB doing well. Body mass index is 40.18 kg/m.  Discussed anatomy u/s 5 wks, she verbalizes and agrees to plan of care. Reviewed common musculoskeletal discomforts of pregnancy and self help measures. Follow for ROB and u/s 5 wks,   [name], CNM

## 2021-04-16 ENCOUNTER — Encounter: Payer: Self-pay | Admitting: Emergency Medicine

## 2021-04-16 ENCOUNTER — Other Ambulatory Visit: Payer: Self-pay

## 2021-04-16 ENCOUNTER — Ambulatory Visit
Admission: EM | Admit: 2021-04-16 | Discharge: 2021-04-16 | Disposition: A | Discharge status: Home / Self Care | Payer: 59 | Attending: Physician Assistant | Admitting: Physician Assistant

## 2021-04-16 DIAGNOSIS — H60392 Other infective otitis externa, left ear: Secondary | ICD-10-CM | POA: Diagnosis not present

## 2021-04-16 MED ORDER — CIPROFLOXACIN-DEXAMETHASONE 0.3-0.1 % OT SUSP: 4.0000 [drp] | Freq: Two times a day (BID) | OTIC | 0 refills | Status: AC

## 2021-04-16 NOTE — ED Provider Notes (Signed)
[facility]    CSN: [id number] Arrival date & time: 04/16/21  1045      History   Chief Complaint Chief Complaint  Patient presents with  . Otalgia    HPI Valerie Garner Jeoffrey Massed is a 25 y.o. female presenting for left ear pain. Patient is currently [redacted] weeks pregnant. She says she has had symptoms since yesterday. Admits to tenderness of the outer ear with some yellowish drainage from the ear. No fever, fatigue, congestion, sore throat, cough or any other symptoms. No hearing issues. Admits to history of ear infections. Has tried OTC ear wax drops w/o improvement in symptoms. No other concerns.  HPI  Past Medical History:  Diagnosis Date  . Postpartum depression associated with first pregnancy     Patient Active Problem List   Diagnosis Date Noted  . Type A blood, Rh positive 03/03/2021  . Obesity in pregnancy 08/08/2019    Past Surgical History:  Procedure Laterality Date  . adenoidectomy    . TONSILLECTOMY Bilateral   . WISDOM TOOTH EXTRACTION      OB History    Gravida  2   Para  1   Term  1   Preterm      AB      Living  1     SAB      IAB      Ectopic      Multiple  0   Live Births  1            Home Medications    Prior to Admission medications   Medication Sig Start Date End Date Taking? Authorizing Provider  ciprofloxacin-dexamethasone (CIPRODEX) OTIC suspension Place 4 drops into the left ear 2 (two) times daily for 7 days. 04/16/21 04/23/21 Yes Shirlee Latch, PA-C  aspirin EC 81 MG tablet Take 1 tablet (81 mg total) by mouth daily. Swallow whole. 03/12/21   Lawhorn, Vanessa [place], CNM  Prenatal Vit-Fe Fumarate-FA (PRENATAL VITAMINS PO) Take by mouth.    [provider]  sertraline (ZOLOFT) 50 MG tablet Take 1 tablet (50 mg total) by mouth daily. Patient not taking: No sig reported 10/30/19 12/04/20  [name], CNM    Family History Family History  Problem Relation Age of Onset  . Breast cancer  Neg Hx   . Ovarian cancer Neg Hx   . Colon cancer Neg Hx     Social History Social History   Tobacco Use  . Smoking status: Never Smoker  . Smokeless tobacco: Never Used  Vaping Use  . Vaping Use: Never used  Substance Use Topics  . Alcohol use: Not Currently  . Drug use: Never     Allergies   Patient has no known allergies.   Review of Systems Review of Systems  Constitutional: Negative for chills, diaphoresis, fatigue and fever.  HENT: Positive for ear discharge and ear pain. Negative for congestion, hearing loss, rhinorrhea, sinus pain and sore throat.   Respiratory: Negative for cough.   Gastrointestinal: Negative for nausea and vomiting.  Skin: Negative for rash.  Neurological: Negative for dizziness and headaches.  Hematological: Negative for adenopathy.     Physical Exam Triage Vital Signs ED Triage Vitals  Enc Vitals Group     BP 04/16/21 1056 131/69     Pulse Rate 04/16/21 1056 94     Resp 04/16/21 1056 18     Temp 04/16/21 1056 97.9 F (36.6 C)     Temp Source 04/16/21 1056 Oral  SpO2 04/16/21 1056 100 %     Weight --      Height --      Head Circumference --      Peak Flow --      Pain Score 04/16/21 1055 8     Pain Loc --      Pain Edu? --      Excl. in GC? --    No data found.  Updated Vital Signs BP 131/69 (BP Location: Left Arm)   Pulse 94   Temp 97.9 F (36.6 C) (Oral)   Resp 18   LMP 12/14/2020 (Approximate)   SpO2 100%       Physical Exam Vitals and nursing note reviewed.  Constitutional:      General: She is not in acute distress.    Appearance: Normal appearance. She is not ill-appearing or toxic-appearing.  HENT:     Head: Normocephalic and atraumatic.     Right Ear: Tympanic membrane, ear canal and external ear normal.     Left Ear: Tympanic membrane, ear canal and external ear normal. Drainage and swelling present.     Nose: Nose normal.     Mouth/Throat:     Mouth: Mucous membranes are moist.     Pharynx:  Oropharynx is clear.  Eyes:     General: No scleral icterus.       Right eye: No discharge.        Left eye: No discharge.     Conjunctiva/sclera: Conjunctivae normal.  Cardiovascular:     Rate and Rhythm: Normal rate and regular rhythm.     Heart sounds: Normal heart sounds.  Pulmonary:     Effort: Pulmonary effort is normal. No respiratory distress.     Breath sounds: Normal breath sounds.  Musculoskeletal:     Cervical back: Neck supple.  Skin:    General: Skin is dry.  Neurological:     General: No focal deficit present.     Mental Status: She is alert. Mental status is at baseline.     Motor: No weakness.     Gait: Gait normal.  Psychiatric:        Mood and Affect: Mood normal.        Behavior: Behavior normal.        Thought Content: Thought content normal.      UC Treatments / Results  Labs (all labs ordered are listed, but only abnormal results are displayed) Labs Reviewed - No data to display  EKG   Radiology No results found.  Procedures Procedures (including critical care time)  Medications Ordered in UC Medications - No data to display  Initial Impression / Assessment and Plan / UC Course  I have reviewed the triage vital signs and the nursing notes.  Pertinent labs & imaging results that were available during my care of the patient were reviewed by me and considered in my medical decision making (see chart for details).    25 y/o female presenting for left ear pain and drainage. Exam consistent with otitis externa. Sent ciprodex and encouraged supportive care with Tylenol, warm compresses, etc. Advised following up if worsening symptoms.    Final Clinical Impressions(s) / UC Diagnoses   Final diagnoses:  Other infective acute otitis externa of left ear   Discharge Instructions   None    ED Prescriptions    Medication Sig Dispense Auth. Provider   ciprofloxacin-dexamethasone (CIPRODEX) OTIC suspension Place 4 drops into the left ear 2 (two)  times daily  for 7 days. 7.5 mL Shirlee Latch, PA-C     PDMP not reviewed this encounter.   Shirlee Latch, PA-C 04/16/21 1131

## 2021-04-16 NOTE — ED Triage Notes (Signed)
Pt is present today with left ear pain. Pt states that pain started yesterday morning.

## 2021-04-18 ENCOUNTER — Other Ambulatory Visit: Payer: Self-pay

## 2021-04-18 ENCOUNTER — Telehealth: Payer: 59 | Admitting: Physician Assistant

## 2021-04-18 ENCOUNTER — Ambulatory Visit
Admission: EM | Admit: 2021-04-18 | Discharge: 2021-04-18 | Disposition: A | Discharge status: Home / Self Care | Payer: 59 | Attending: Physician Assistant | Admitting: Physician Assistant

## 2021-04-18 ENCOUNTER — Encounter: Payer: Self-pay | Admitting: Emergency Medicine

## 2021-04-18 DIAGNOSIS — H6092 Unspecified otitis externa, left ear: Secondary | ICD-10-CM

## 2021-04-18 DIAGNOSIS — H60392 Other infective otitis externa, left ear: Secondary | ICD-10-CM | POA: Diagnosis not present

## 2021-04-18 MED ORDER — CEFDINIR 300 MG PO CAPS: 300.0000 mg | ORAL_CAPSULE | Freq: Two times a day (BID) | ORAL | 0 refills | Status: AC

## 2021-04-18 NOTE — Progress Notes (Signed)
Based on what you shared with me, I feel your condition warrants further evaluation and I recommend that you be seen for a face to face visit.   I see you were seen a few days ago and started on treatment for otitis externa with Ciprodex. If symptoms are worsening despite treatment, you need to be seen in person for repeat examination so the appropriate changes in treatment can be made (you may need placement of a wick into the ear canal to help the medicine gets where it needs to). Please contact your primary care physician practice to be seen. Many offices offer virtual options to be seen via video if you are not comfortable going in person to a medical facility at this time.  If you do not have a PCP, [facility] offers a free physician referral service available at [phone]. Our trained staff has the experience, knowledge and resources to put you in touch with a physician who is right for you.   You also have the option of a video visit through [email or web address]  If you are having a true medical emergency please call 911.  NOTE: If you entered your credit card information for this eVisit, you will not be charged. You may see a "hold" on your card for the $35 but that hold will drop off and you will not have a charge processed.  Your e-visit answers were reviewed by a board certified advanced clinical practitioner to complete your personal care plan.  Thank you for using e-Visits.

## 2021-04-18 NOTE — ED Triage Notes (Signed)
Patient c/o left ear pain that started on Thursday morning.  Patient states that she was seen here on Friday and given ear drops for her left ear infection.  Patient states the pain in her left ear has not gotten any better.  Patient denies drainage from her ear.  Patient denies fevers.

## 2021-04-18 NOTE — ED Provider Notes (Signed)
[facility]    CSN: [id number] Arrival date & time: 04/18/21  3710      History   Chief Complaint Chief Complaint  Patient presents with  . Otalgia    HPI Valerie Garner is a 25 y.o. ([redacted] week pregnant)  female presenting for continued left sided ear pain x3 days.  Patient seen in clinic 2 days ago and treated with Ciprodex by me for otitis externa left ear.  She says she has been using the drops but thinks her symptoms have worsened.  She admits to feeling like there is more swelling in the ear and is afraid that some of the eardrops have not gotten into the ear.  She has not noticed any continued drainage.  She denies any fever, fatigue, congestion, sore throat or cough.  Admits to a little bit decreased hearing in this ear.  Admits to history of both inner and outer ear infections.  Patient says that she is normally received oral antibiotics and got better.  No other concerns.  HPI  Past Medical History:  Diagnosis Date  . Postpartum depression associated with first pregnancy     Patient Active Problem List   Diagnosis Date Noted  . Type A blood, Rh positive 03/03/2021  . Obesity in pregnancy 08/08/2019    Past Surgical History:  Procedure Laterality Date  . adenoidectomy    . TONSILLECTOMY Bilateral   . WISDOM TOOTH EXTRACTION      OB History    Gravida  2   Para  1   Term  1   Preterm      AB      Living  1     SAB      IAB      Ectopic      Multiple  0   Live Births  1            Home Medications    Prior to Admission medications   Medication Sig Start Date End Date Taking? Authorizing Provider  aspirin EC 81 MG tablet Take 1 tablet (81 mg total) by mouth daily. Swallow whole. 03/12/21  Yes Lawhorn, Vanessa [place], CNM  cefdinir (OMNICEF) 300 MG capsule Take 1 capsule (300 mg total) by mouth 2 (two) times daily for 10 days. 04/18/21 04/28/21 Yes Shirlee Latch, PA-C  ciprofloxacin-dexamethasone (CIPRODEX) OTIC  suspension Place 4 drops into the left ear 2 (two) times daily for 7 days. 04/16/21 04/23/21 Yes Shirlee Latch, PA-C  Prenatal Vit-Fe Fumarate-FA (PRENATAL VITAMINS PO) Take by mouth.   Yes [provider]  sertraline (ZOLOFT) 50 MG tablet Take 1 tablet (50 mg total) by mouth daily. Patient not taking: No sig reported 10/30/19 12/04/20  [name], CNM    Family History Family History  Problem Relation Age of Onset  . Breast cancer Neg Hx   . Ovarian cancer Neg Hx   . Colon cancer Neg Hx     Social History Social History   Tobacco Use  . Smoking status: Never Smoker  . Smokeless tobacco: Never Used  Vaping Use  . Vaping Use: Never used  Substance Use Topics  . Alcohol use: Not Currently  . Drug use: Never     Allergies   Patient has no known allergies.   Review of Systems Review of Systems  Constitutional: Negative for fatigue and fever.  HENT: Positive for ear pain and hearing loss. Negative for congestion, ear discharge, rhinorrhea and sore throat.  Respiratory: Negative for cough.   Gastrointestinal: Negative for nausea and vomiting.  Neurological: Negative for dizziness and headaches.     Physical Exam Triage Vital Signs ED Triage Vitals  Enc Vitals Group     BP 04/18/21 0938 118/62     Pulse Rate 04/18/21 0938 95     Resp --      Temp 04/18/21 0938 98.7 F (37.1 C)     Temp Source 04/18/21 0938 Oral     SpO2 04/18/21 0938 100 %     Weight 04/18/21 0935 234 lb 2.1 oz (106.2 kg)     Height 04/18/21 0935 5\' 4"  (1.626 m)     Head Circumference --      Peak Flow --      Pain Score 04/18/21 0935 8     Pain Loc --      Pain Edu? --      Excl. in GC? --    No data found.  Updated Vital Signs BP 118/62 (BP Location: Left Arm)   Pulse 95   Temp 98.7 F (37.1 C) (Oral)   Ht 5\' 4"  (1.626 m)   Wt 234 lb 2.1 oz (106.2 kg)   LMP 12/14/2020 (Approximate)   SpO2 100%   Breastfeeding No   BMI 40.19 kg/m       Physical Exam Vitals and  nursing note reviewed.  Constitutional:      General: She is not in acute distress.    Appearance: Normal appearance. She is not ill-appearing or toxic-appearing.  HENT:     Head: Normocephalic and atraumatic.     Left Ear: Drainage, swelling and tenderness present. Tympanic membrane is injected.     Nose: Nose normal.     Mouth/Throat:     Mouth: Mucous membranes are moist.     Pharynx: Oropharynx is clear.  Eyes:     General: No scleral icterus.       Right eye: No discharge.        Left eye: No discharge.     Conjunctiva/sclera: Conjunctivae normal.  Cardiovascular:     Rate and Rhythm: Normal rate and regular rhythm.     Heart sounds: Normal heart sounds.  Pulmonary:     Effort: Pulmonary effort is normal. No respiratory distress.     Breath sounds: Normal breath sounds.  Musculoskeletal:     Cervical back: Neck supple.  Skin:    General: Skin is dry.  Neurological:     General: No focal deficit present.     Mental Status: She is alert. Mental status is at baseline.     Motor: No weakness.     Gait: Gait normal.  Psychiatric:        Mood and Affect: Mood normal.        Behavior: Behavior normal.        Thought Content: Thought content normal.      UC Treatments / Results  Labs (all labs ordered are listed, but only abnormal results are displayed) Labs Reviewed - No data to display  EKG   Radiology No results found.  Procedures Procedures (including critical care time)  Medications Ordered in UC Medications - No data to display  Initial Impression / Assessment and Plan / UC Course  I have reviewed the triage vital signs and the nursing notes.  Pertinent labs & imaging results that were available during my care of the patient were reviewed by me and considered in my medical decision making (see chart  for details).   25 year old female presenting for left ear pain.  Patient seen by me 2 days ago for otitis externa.  Treated with Ciprodex.  No improvement.   Today, she has increased swelling of the EAC.  Continued yellowish debris and tenderness with palpation of ear.  Slight injection of TM.  Ear wick placed.  Patient tolerated this well.  Advised her to continue with the Ciprodex.  Patient concerned she may need an oral antibiotic.  I talked about the fact that her ear infection is in the canal and oral antibiotics are not necessarily indicated for this.  We will try a trial of cefdinir since there is little bit of injection of the TM.  Advised her to continue with increasing rest and fluids and Tylenol for discomfort.  Advised to follow-up with our department or ENT if not improving over the next week or for any increased swelling or worsening symptoms.   Final Clinical Impressions(s) / UC Diagnoses   Final diagnoses:  Other infective acute otitis externa of left ear     Discharge Instructions     Continue with the eardrops.  I have placed a wick in the ear.  That should fall out once the swelling goes down.  I have also sent an oral antibiotic which may or may not help.  The infection truly is in the ear canal and the best treatment is the eardrops that you have been taking.  You can take Tylenol for pain and use warm compresses.  Make sure to use cotton ball in the ear whenever you are showering so as to not get it wet.  You should be seen again if you feel you are still not getting better after the next few days or your swelling worsens or you develop a fever.   ED Prescriptions    Medication Sig Dispense Auth. Provider   cefdinir (OMNICEF) 300 MG capsule Take 1 capsule (300 mg total) by mouth 2 (two) times daily for 10 days. 20 capsule Shirlee Latch, PA-C     PDMP not reviewed this encounter.   Shirlee Latch, PA-C 04/18/21 1051

## 2021-04-18 NOTE — Discharge Instructions (Signed)
Continue with the eardrops.  I have placed a wick in the ear.  That should fall out once the swelling goes down.  I have also sent an oral antibiotic which may or may not help.  The infection truly is in the ear canal and the best treatment is the eardrops that you have been taking.  You can take Tylenol for pain and use warm compresses.  Make sure to use cotton ball in the ear whenever you are showering so as to not get it wet.  You should be seen again if you feel you are still not getting better after the next few days or your swelling worsens or you develop a fever.

## 2021-05-06 ENCOUNTER — Other Ambulatory Visit: Payer: Self-pay

## 2021-05-06 ENCOUNTER — Other Ambulatory Visit: Payer: Self-pay | Admitting: Certified Nurse Midwife

## 2021-05-06 ENCOUNTER — Ambulatory Visit: Payer: 59 | Attending: Certified Nurse Midwife

## 2021-05-06 ENCOUNTER — Other Ambulatory Visit: Payer: Self-pay | Admitting: *Deleted

## 2021-05-06 DIAGNOSIS — Z3482 Encounter for supervision of other normal pregnancy, second trimester: Secondary | ICD-10-CM | POA: Diagnosis not present

## 2021-05-06 DIAGNOSIS — Z3A19 19 weeks gestation of pregnancy: Secondary | ICD-10-CM | POA: Diagnosis not present

## 2021-05-06 DIAGNOSIS — Z6841 Body Mass Index (BMI) 40.0 and over, adult: Secondary | ICD-10-CM

## 2021-05-06 DIAGNOSIS — Z363 Encounter for antenatal screening for malformations: Secondary | ICD-10-CM | POA: Insufficient documentation

## 2021-05-06 DIAGNOSIS — O99212 Obesity complicating pregnancy, second trimester: Secondary | ICD-10-CM | POA: Diagnosis not present

## 2021-05-06 DIAGNOSIS — E669 Obesity, unspecified: Secondary | ICD-10-CM

## 2021-05-06 DIAGNOSIS — Z3A14 14 weeks gestation of pregnancy: Secondary | ICD-10-CM | POA: Diagnosis not present

## 2021-05-14 ENCOUNTER — Other Ambulatory Visit: Payer: Self-pay

## 2021-05-14 ENCOUNTER — Ambulatory Visit (INDEPENDENT_AMBULATORY_CARE_PROVIDER_SITE_OTHER): Payer: 59 | Admitting: Certified Nurse Midwife

## 2021-05-14 ENCOUNTER — Encounter: Payer: Self-pay | Admitting: Certified Nurse Midwife

## 2021-05-14 VITALS — BP 125/81 | HR 101 | Wt 232.0 lb

## 2021-05-14 DIAGNOSIS — Z3482 Encounter for supervision of other normal pregnancy, second trimester: Secondary | ICD-10-CM

## 2021-05-14 DIAGNOSIS — Z3A20 20 weeks gestation of pregnancy: Secondary | ICD-10-CM

## 2021-05-14 LAB — POCT URINALYSIS DIPSTICK OB
Bilirubin, UA: NEGATIVE
Glucose, UA: NEGATIVE
Ketones, UA: NEGATIVE
Leukocytes, UA: NEGATIVE
Nitrite, UA: NEGATIVE
Spec Grav, UA: 1.01 (ref 1.010–1.025)
Urobilinogen, UA: 0.2 E.U./dL
pH, UA: 7 (ref 5.0–8.0)

## 2021-05-14 NOTE — Progress Notes (Signed)
ROB: She is doing well today, no new concerns. 

## 2021-05-14 NOTE — Progress Notes (Signed)
ROB-Doing well, no questions or concerns. Follow up ultrasound scheduled on 06/04/2021 due to incomplete ANATOMY SCAN. Considering waterbirth, link to class sent via MyChart. Anticipatory guidance regarding course of prenatal care. Reviewed red flag symptoms and when to call. RTC x 4 weeks for ROB or sooner if needed.

## 2021-05-14 NOTE — Patient Instructions (Signed)
Second Trimester of Pregnancy °The second trimester of pregnancy is from week 13 through week 27. This is also called months 4 through 6 of pregnancy. This is often the time when you feel your best. °During the second trimester: °Morning sickness is less or has stopped. °You may have more energy. °You may feel hungry more often. °At this time, your unborn baby (fetus) is growing very fast. At the end of the sixth month, the unborn baby may be up to 12 inches long and weigh about 1½ pounds. You will likely start to feel the baby move between 16 and 20 weeks of pregnancy. °Body changes during your second trimester °Your body continues to go through many changes during this time. The changes vary and generally return to normal after the baby is born. °Physical changes °You will gain more weight. °You may start to get stretch marks on your hips, belly (abdomen), and breasts. °Your breasts will grow and may hurt. °Dark spots or blotches may develop on your face. °A dark line from your belly button to the pubic area (linea nigra) may appear. °You may have changes in your hair. °Health changes °You may have headaches. °You may have heartburn. °You may have trouble pooping (constipation). °You may have hemorrhoids or swollen, bulging veins (varicose veins). °Your gums may bleed. °You may pee (urinate) more often. °You may have back pain. °Follow these instructions at home: °Medicines °Take over-the-counter and prescription medicines only as told by your doctor. Some medicines are not safe during pregnancy. °Take a prenatal vitamin that contains at least 600 micrograms (mcg) of folic acid. °Eating and drinking °Eat healthy meals that include: °Fresh fruits and vegetables. °Whole grains. °Good sources of protein, such as meat, eggs, or tofu. °Low-fat dairy products. °Avoid raw meat and unpasteurized juice, milk, and cheese. °You may need to take these actions to prevent or treat trouble pooping: °Drink enough fluids to keep  your pee (urine) pale yellow. °Eat foods that are high in fiber. These include beans, whole grains, and fresh fruits and vegetables. °Limit foods that are high in fat and sugar. These include fried or sweet foods. °Activity °Exercise only as told by your doctor. Most people can do their usual exercise during pregnancy. Try to exercise for 30 minutes at least 5 days a week. °Stop exercising if you have pain or cramps in your belly or lower back. °Do not exercise if it is too hot or too humid, or if you are in a place of great height (high altitude). °Avoid heavy lifting. °If you choose to, you may have sex unless your doctor tells you not to. °Relieving pain and discomfort °Wear a good support bra if your breasts are sore. °Take warm water baths (sitz baths) to soothe pain or discomfort caused by hemorrhoids. Use hemorrhoid cream if your doctor approves. °Rest with your legs raised (elevated) if you have leg cramps or low back pain. °If you develop bulging veins in your legs: °Wear support hose as told by your doctor. °Raise your feet for 15 minutes, 3-4 times a day. °Limit salt in your food. °Safety °Wear your seat belt at all times when you are in a car. °Talk with your doctor if someone is hurting you or yelling at you a lot. °Lifestyle °Do not use hot tubs, steam rooms, or saunas. °Do not douche. Do not use tampons or scented sanitary pads. °Avoid cat litter boxes and soil used by cats. These carry germs that can harm your baby and   can cause a loss of your baby by miscarriage or stillbirth. °Do not use herbal medicines, illegal drugs, or medicines that are not approved by your doctor. Do not drink alcohol. °Do not smoke or use any products that contain nicotine or tobacco. If you need help quitting, ask your doctor. °General instructions °Keep all follow-up visits. This is important. °Ask your doctor about local prenatal classes. °Ask your doctor about the right foods to eat or for help finding a  counselor. °Where to find more information °American Pregnancy Association: americanpregnancy.org °American College of Obstetricians and Gynecologists: www.acog.org °Office on Women's Health: womenshealth.gov/pregnancy °Contact a doctor if: °You have a headache that does not go away when you take medicine. °You have changes in how you see, or you see spots in front of your eyes. °You have mild cramps, pressure, or pain in your lower belly. °You continue to feel like you may vomit (nauseous), you vomit, or you have watery poop (diarrhea). °You have bad-smelling fluid coming from your vagina. °You have pain when you pee or your pee smells bad. °You have very bad swelling of your face, hands, ankles, feet, or legs. °You have a fever. °Get help right away if: °You are leaking fluid from your vagina. °You have spotting or bleeding from your vagina. °You have very bad belly cramping or pain. °You have trouble breathing. °You have chest pain. °You faint. °You have not felt your baby move for the time period told by your doctor. °You have new or increased pain, swelling, or redness in an arm or leg. °Summary °The second trimester of pregnancy is from week 13 through week 27 (months 4 through 6). °Eat healthy meals. °Exercise as told by your doctor. Most people can do their usual exercise during pregnancy. °Do not use herbal medicines, illegal drugs, or medicines that are not approved by your doctor. Do not drink alcohol. °Call your doctor if you get sick or if you notice anything unusual about your pregnancy. °This information is not intended to replace advice given to you by your health care provider. Make sure you discuss any questions you have with your health care provider. °Document Revised: 04/29/2020 Document Reviewed: 03/05/2020 °Elsevier Patient Education © 2022 [employer] ° °

## 2021-05-19 ENCOUNTER — Other Ambulatory Visit: Payer: 59

## 2021-06-04 ENCOUNTER — Encounter: Payer: Self-pay | Admitting: *Deleted

## 2021-06-04 ENCOUNTER — Ambulatory Visit: Payer: 59 | Admitting: *Deleted

## 2021-06-04 ENCOUNTER — Ambulatory Visit: Payer: 59 | Attending: Obstetrics and Gynecology

## 2021-06-04 ENCOUNTER — Other Ambulatory Visit: Payer: Self-pay

## 2021-06-04 VITALS — BP 127/67 | HR 83

## 2021-06-04 DIAGNOSIS — Z362 Encounter for other antenatal screening follow-up: Secondary | ICD-10-CM

## 2021-06-04 DIAGNOSIS — Z6841 Body Mass Index (BMI) 40.0 and over, adult: Secondary | ICD-10-CM

## 2021-06-04 DIAGNOSIS — O99212 Obesity complicating pregnancy, second trimester: Secondary | ICD-10-CM | POA: Insufficient documentation

## 2021-06-04 DIAGNOSIS — Z3A23 23 weeks gestation of pregnancy: Secondary | ICD-10-CM | POA: Diagnosis not present

## 2021-06-08 ENCOUNTER — Other Ambulatory Visit: Payer: Self-pay | Admitting: *Deleted

## 2021-06-08 DIAGNOSIS — Z6841 Body Mass Index (BMI) 40.0 and over, adult: Secondary | ICD-10-CM

## 2021-06-09 ENCOUNTER — Other Ambulatory Visit (HOSPITAL_COMMUNITY)
Admission: RE | Admit: 2021-06-09 | Discharge: 2021-06-09 | Disposition: A | Discharge status: Home / Self Care | Payer: 59 | Source: Ambulatory Visit | Attending: Certified Nurse Midwife | Admitting: Certified Nurse Midwife

## 2021-06-09 ENCOUNTER — Ambulatory Visit (INDEPENDENT_AMBULATORY_CARE_PROVIDER_SITE_OTHER): Payer: 59 | Admitting: Certified Nurse Midwife

## 2021-06-09 ENCOUNTER — Other Ambulatory Visit: Payer: Self-pay

## 2021-06-09 VITALS — BP 126/82 | HR 105 | Wt 233.2 lb

## 2021-06-09 DIAGNOSIS — O26892 Other specified pregnancy related conditions, second trimester: Secondary | ICD-10-CM

## 2021-06-09 DIAGNOSIS — O23592 Infection of other part of genital tract in pregnancy, second trimester: Secondary | ICD-10-CM | POA: Insufficient documentation

## 2021-06-09 DIAGNOSIS — Z3482 Encounter for supervision of other normal pregnancy, second trimester: Secondary | ICD-10-CM | POA: Diagnosis present

## 2021-06-09 DIAGNOSIS — B9689 Other specified bacterial agents as the cause of diseases classified elsewhere: Secondary | ICD-10-CM | POA: Diagnosis not present

## 2021-06-09 DIAGNOSIS — Z3A23 23 weeks gestation of pregnancy: Secondary | ICD-10-CM

## 2021-06-09 DIAGNOSIS — N898 Other specified noninflammatory disorders of vagina: Secondary | ICD-10-CM | POA: Insufficient documentation

## 2021-06-09 DIAGNOSIS — O26891 Other specified pregnancy related conditions, first trimester: Secondary | ICD-10-CM

## 2021-06-09 LAB — POCT URINALYSIS DIPSTICK OB
Bilirubin, UA: NEGATIVE
Blood, UA: NEGATIVE
Glucose, UA: NEGATIVE
Ketones, UA: NEGATIVE
Leukocytes, UA: NEGATIVE
Nitrite, UA: NEGATIVE
POC,PROTEIN,UA: NEGATIVE
Spec Grav, UA: 1.01 (ref 1.010–1.025)
Urobilinogen, UA: 0.2 E.U./dL
pH, UA: 8 (ref 5.0–8.0)

## 2021-06-09 NOTE — Progress Notes (Signed)
ROB doing well. Reviewed follow up anatomy u/s . She has repeat growth with MFM in 5 wks. Discussed water birth she is signing up for this months class. She has concerns about discharge , she is requesting a swab. Self swab completed. Will follow up with results. Discussed seeing MD for a meet and great. She agrees.   Follow up 4 wks for ROB and glucose screen.   [name], CNM

## 2021-06-09 NOTE — Patient Instructions (Signed)
Round Ligament Pain  The round ligament is a cord of muscle and tissue that helps support the uterus. It can become a source of pain during pregnancy if it becomes stretched or twisted as the baby grows. The pain usually begins in the second trimester (13-28 weeks) of pregnancy, and it can come and go until the baby is delivered.It is not a serious problem, and it does not cause harm to the baby. Round ligament pain is usually a short, sharp, and pinching pain, but it can also be a dull, lingering, and aching pain. The pain is felt in the lower side of the abdomen or in the groin. It usually starts deep in the groin and moves up to the outside of the hip area. The pain may occur when you: Suddenly change position, such as quickly going from a sitting to standing position. Roll over in bed. Cough or sneeze. Do physical activity. Follow these instructions at home:  Watch your condition for any changes. When the pain starts, relax. Then try any of these methods to help with the pain: Sitting down. Flexing your knees up to your abdomen. Lying on your side with one pillow under your abdomen and another pillow between your legs. Sitting in a warm bath for 15-20 minutes or until the pain goes away. Take over-the-counter and prescription medicines only as told by your health care provider. Move slowly when you sit down or stand up. Avoid long walks if they cause pain. Stop or reduce your physical activities if they cause pain. Keep all follow-up visits as told by your health care provider. This is important. Contact a health care provider if: Your pain does not go away with treatment. You feel pain in your back that you did not have before. Your medicine is not helping. Get help right away if: You have a fever or chills. You develop uterine contractions. You have vaginal bleeding. You have nausea or vomiting. You have diarrhea. You have pain when you urinate. Summary Round ligament pain is  felt in the lower abdomen or groin. It is usually a short, sharp, and pinching pain. It can also be a dull, lingering, and aching pain. This pain usually begins in the second trimester (13-28 weeks). It occurs because the uterus is stretching with the growing baby, and it is not harmful to the baby. You may notice the pain when you suddenly change position, when you cough or sneeze, or during physical activity. Relaxing, flexing your knees to your abdomen, lying on one side, or taking a warm bath may help to get rid of the pain. Get help from your health care provider if the pain does not go away or if you have vaginal bleeding, nausea, vomiting, diarrhea, or painful urination. This information is not intended to replace advice given to you by your health care provider. Make sure you discuss any questions you have with your healthcare provider. Document Revised: 11/06/2020 Document Reviewed: 05/09/2018 Elsevier Patient Education  2022 [employer]  

## 2021-06-10 LAB — CERVICOVAGINAL ANCILLARY ONLY
Bacterial Vaginitis (gardnerella): POSITIVE — AB
Candida Glabrata: NEGATIVE
Candida Vaginitis: NEGATIVE
Comment: NEGATIVE
Comment: NEGATIVE
Comment: NEGATIVE

## 2021-06-12 ENCOUNTER — Other Ambulatory Visit: Payer: Self-pay | Admitting: Certified Nurse Midwife

## 2021-06-12 MED ORDER — METRONIDAZOLE 500 MG PO TABS: 500.0000 mg | ORAL_TABLET | Freq: Two times a day (BID) | ORAL | 0 refills | Status: AC

## 2021-06-14 ENCOUNTER — Other Ambulatory Visit: Payer: Self-pay

## 2021-06-14 NOTE — Telephone Encounter (Signed)
Unable to find patient's requested pharmacy, spoke with patient and she is going to call her primary pharmacy and get them to send over her Rx to pharmacy of choice. Pt voiced no further questions.

## 2021-07-06 ENCOUNTER — Encounter: Payer: Self-pay | Admitting: Certified Nurse Midwife

## 2021-07-06 ENCOUNTER — Other Ambulatory Visit: Payer: 59

## 2021-07-06 ENCOUNTER — Other Ambulatory Visit: Payer: Self-pay

## 2021-07-06 ENCOUNTER — Ambulatory Visit (INDEPENDENT_AMBULATORY_CARE_PROVIDER_SITE_OTHER): Payer: 59 | Admitting: Certified Nurse Midwife

## 2021-07-06 VITALS — BP 120/79 | HR 106 | Wt 232.5 lb

## 2021-07-06 DIAGNOSIS — Z23 Encounter for immunization: Secondary | ICD-10-CM

## 2021-07-06 DIAGNOSIS — Z3483 Encounter for supervision of other normal pregnancy, third trimester: Secondary | ICD-10-CM | POA: Diagnosis not present

## 2021-07-06 DIAGNOSIS — Z3A27 27 weeks gestation of pregnancy: Secondary | ICD-10-CM | POA: Diagnosis not present

## 2021-07-06 LAB — POCT URINALYSIS DIPSTICK OB
Bilirubin, UA: NEGATIVE
Blood, UA: NEGATIVE
Glucose, UA: NEGATIVE
Ketones, UA: NEGATIVE
Leukocytes, UA: NEGATIVE
Nitrite, UA: NEGATIVE
POC,PROTEIN,UA: NEGATIVE
Spec Grav, UA: 1.015 (ref 1.010–1.025)
Urobilinogen, UA: 0.2 E.U./dL
pH, UA: 7 (ref 5.0–8.0)

## 2021-07-06 MED ORDER — TETANUS-DIPHTH-ACELL PERTUSSIS 5-2.5-18.5 LF-MCG/0.5 IM SUSY
0.5000 mL | PREFILLED_SYRINGE | Freq: Once | INTRAMUSCULAR | Status: AC
  Administered 2021-07-06: 0.5 mL via INTRAMUSCULAR

## 2021-07-06 NOTE — Patient Instructions (Signed)
Oral Glucose Tolerance Test During Pregnancy °Why am I having this test? °The oral glucose tolerance test (OGTT) is done to check how your body processes blood sugar (glucose). This is one of several tests used to diagnose diabetes that develops during pregnancy (gestational diabetes mellitus). Gestational diabetes is a short-term form of diabetes that some women develop while they are pregnant. It usually occurs during the second trimester of pregnancy and goes away after delivery. °Testing, or screening, for gestational diabetes usually occurs at weeks 24-28 of pregnancy. You may have the OGTT test after having a 1-hour glucose screening test if the results from that test indicate that you may have gestational diabetes. This test may also be needed if: °You have a history of gestational diabetes. °There is a history of giving birth to very large babies or of losing pregnancies (having stillbirths). °You have signs and symptoms of diabetes, such as: °Changes in your eyesight. °Tingling or numbness in your hands or feet. °Changes in hunger, thirst, and urination, and these are not explained by your pregnancy. °What is being tested? °This test measures the amount of glucose in your blood at different times during a period of 3 hours. This shows how well your body can process glucose. °What kind of sample is taken? °Blood samples are required for this test. They are usually collected by inserting a needle into a blood vessel. °How do I prepare for this test? °For 3 days before your test, eat normally. Have plenty of carbohydrate-rich foods. °Follow instructions from your health care provider about: °Eating or drinking restrictions on the day of the test. You may be asked not to eat or drink anything other than water (to fast) starting 8-10 hours before the test. °Changing or stopping your regular medicines. Some medicines may interfere with this test. °Tell a health care provider about: °All medicines you are taking,  including vitamins, herbs, eye drops, creams, and over-the-counter medicines. °Any blood disorders you have. °Any surgeries you have had. °Any medical conditions you have. °What happens during the test? °First, your blood glucose will be measured. This is referred to as your fasting blood glucose because you fasted before the test. Then, you will drink a glucose solution that contains a certain amount of glucose. Your blood glucose will be measured again 1, 2, and 3 hours after you drink the solution. °This test takes about 3 hours to complete. You will need to stay at the testing location during this time. During the testing period: °Do not eat or drink anything other than the glucose solution. °Do not exercise. °Do not use any products that contain nicotine or tobacco, such as cigarettes, e-cigarettes, and chewing tobacco. These can affect your test results. If you need help quitting, ask your health care provider. °The testing procedure may vary among health care providers and hospitals. °How are the results reported? °Your results will be reported as milligrams of glucose per deciliter of blood (mg/dL) or millimoles per liter (mmol/L). There is more than one source for screening and diagnosis reference values used to diagnose gestational diabetes. Your health care provider will compare your results to normal values that were established after testing a large group of people (reference values). Reference values may vary among labs and hospitals. For this test (Carpenter-Coustan), reference values are: °Fasting: 95 mg/dL (5.3 mmol/L). °1 hour: 180 mg/dL (10.0 mmol/L). °2 hour: 155 mg/dL (8.6 mmol/L). °3 hour: 140 mg/dL (7.8 mmol/L). °What do the results mean? °Results below the reference values are considered   normal. If two or more of your blood glucose levels are at or above the reference values, you may be diagnosed with gestational diabetes. If only one level is high, your health care provider may suggest  repeat testing or other tests to confirm a diagnosis. °Talk with your health care provider about what your results mean. °Questions to ask your health care provider °Ask your health care provider, or the department that is doing the test: °When will my results be ready? °How will I get my results? °What are my treatment options? °What other tests do I need? °What are my next steps? °Summary °The oral glucose tolerance test (OGTT) is one of several tests used to diagnose diabetes that develops during pregnancy (gestational diabetes mellitus). Gestational diabetes is a short-term form of diabetes that some women develop while they are pregnant. °You may have the OGTT test after having a 1-hour glucose screening test if the results from that test show that you may have gestational diabetes. You may also have this test if you have any symptoms or risk factors for this type of diabetes. °Talk with your health care provider about what your results mean. °This information is not intended to replace advice given to you by your health care provider. Make sure you discuss any questions you have with your health care provider. °Document Revised: 04/30/2020 Document Reviewed: 04/30/2020 °Elsevier Patient Education © 2022 [employer] Inc. ° °

## 2021-07-06 NOTE — Addendum Note (Signed)
Addended by: Lady Deutscher on: 07/06/2021 09:35 AM   Modules accepted: Orders

## 2021-07-06 NOTE — Progress Notes (Signed)
ROB doing well. Feels good movement. 28 wk labs today: Glucose screen/RPR/CBC. Tdap completed, Blood transfusion consent completed, all questions answered. Ready set baby reviewed, see check list for topics covered. Sample birth plan given, will follow up in upcoming visits. Discussed birth control after delivery, information pamphlet given. Pt request to meet Dr. Logan Bores at next visit as he is the only provider she has not seen in case he should be present for her delivery .  Follow up 2 wk  for ROB or sooner if needed.    [name], CNM

## 2021-07-07 ENCOUNTER — Other Ambulatory Visit: Payer: Self-pay | Admitting: Certified Nurse Midwife

## 2021-07-07 LAB — CBC
Hematocrit: 34.6 % (ref 34.0–46.6)
Hemoglobin: 11.4 g/dL (ref 11.1–15.9)
MCH: 29.3 pg (ref 26.6–33.0)
MCHC: 32.9 g/dL (ref 31.5–35.7)
MCV: 89 fL (ref 79–97)
Platelets: 340 10*3/uL (ref 150–450)
RBC: 3.89 x10E6/uL (ref 3.77–5.28)
RDW: 12.4 % (ref 11.7–15.4)
WBC: 10.4 10*3/uL (ref 3.4–10.8)

## 2021-07-07 LAB — RPR: RPR Ser Ql: NONREACTIVE

## 2021-07-07 LAB — GLUCOSE, 1 HOUR GESTATIONAL: Gestational Diabetes Screen: 148 mg/dL — ABNORMAL HIGH (ref 65–139)

## 2021-07-09 ENCOUNTER — Ambulatory Visit: Payer: 59 | Attending: Obstetrics

## 2021-07-09 ENCOUNTER — Other Ambulatory Visit: Payer: Self-pay

## 2021-07-09 ENCOUNTER — Ambulatory Visit: Payer: 59 | Admitting: *Deleted

## 2021-07-09 ENCOUNTER — Encounter: Payer: Self-pay | Admitting: *Deleted

## 2021-07-09 DIAGNOSIS — Z362 Encounter for other antenatal screening follow-up: Secondary | ICD-10-CM | POA: Insufficient documentation

## 2021-07-09 DIAGNOSIS — O99212 Obesity complicating pregnancy, second trimester: Secondary | ICD-10-CM

## 2021-07-09 DIAGNOSIS — Z3A28 28 weeks gestation of pregnancy: Secondary | ICD-10-CM | POA: Insufficient documentation

## 2021-07-09 DIAGNOSIS — Z6841 Body Mass Index (BMI) 40.0 and over, adult: Secondary | ICD-10-CM

## 2021-07-09 DIAGNOSIS — O99213 Obesity complicating pregnancy, third trimester: Secondary | ICD-10-CM | POA: Insufficient documentation

## 2021-07-12 ENCOUNTER — Other Ambulatory Visit: Payer: Self-pay | Admitting: *Deleted

## 2021-07-12 DIAGNOSIS — Z6841 Body Mass Index (BMI) 40.0 and over, adult: Secondary | ICD-10-CM

## 2021-07-13 ENCOUNTER — Other Ambulatory Visit: Payer: 59

## 2021-07-13 ENCOUNTER — Other Ambulatory Visit: Payer: Self-pay

## 2021-07-13 DIAGNOSIS — R7309 Other abnormal glucose: Secondary | ICD-10-CM | POA: Diagnosis not present

## 2021-07-14 LAB — GESTATIONAL GLUCOSE TOLERANCE
Glucose, Fasting: 87 mg/dL (ref 65–94)
Glucose, GTT - 1 Hour: 137 mg/dL (ref 65–179)
Glucose, GTT - 2 Hour: 148 mg/dL (ref 65–154)
Glucose, GTT - 3 Hour: 61 mg/dL — ABNORMAL LOW (ref 65–139)

## 2021-07-23 ENCOUNTER — Other Ambulatory Visit: Payer: Self-pay

## 2021-07-23 ENCOUNTER — Encounter: Payer: Self-pay | Admitting: Certified Nurse Midwife

## 2021-07-23 ENCOUNTER — Ambulatory Visit (INDEPENDENT_AMBULATORY_CARE_PROVIDER_SITE_OTHER): Payer: 59 | Admitting: Certified Nurse Midwife

## 2021-07-23 VITALS — BP 111/77 | HR 106 | Wt 232.4 lb

## 2021-07-23 DIAGNOSIS — Z3403 Encounter for supervision of normal first pregnancy, third trimester: Secondary | ICD-10-CM

## 2021-07-23 DIAGNOSIS — Z3A30 30 weeks gestation of pregnancy: Secondary | ICD-10-CM

## 2021-07-23 LAB — POCT URINALYSIS DIPSTICK OB
Bilirubin, UA: NEGATIVE
Blood, UA: NEGATIVE
Clarity, UA: NEGATIVE
Glucose, UA: NEGATIVE
Ketones, UA: NEGATIVE
Leukocytes, UA: NEGATIVE
Nitrite, UA: NEGATIVE
POC,PROTEIN,UA: NEGATIVE
Spec Grav, UA: 1.015 (ref 1.010–1.025)
Urobilinogen, UA: 0.2 E.U./dL
pH, UA: 7 (ref 5.0–8.0)

## 2021-07-23 NOTE — Progress Notes (Signed)
ROB doing well. Feels good movement. Discussed MFM u/s for growth scheduled 08/06/2021. Reviewed birth plan , copy scanned to chart. Discussed seeing MD's next visit as I am on vacation. She agrees. Discussed NST at 34 wks ( elevated BMI). She agrees. Follow up MD 2 wks , with me 4 wks for ROB and NST.   [name], CNM

## 2021-07-23 NOTE — Patient Instructions (Signed)
[facility]  [facility]  [address], [place], [place] [address]  Phone: [phone]  [facility] (second location)  [address] [place], [place] [address]  Phone: [phone]  [facility] ([place]) [address], [place], [place] [address] Phone: [phone]  [facility]  [address] , [place] [address]  Phone: [phone] 

## 2021-08-02 DIAGNOSIS — Z3483 Encounter for supervision of other normal pregnancy, third trimester: Secondary | ICD-10-CM | POA: Diagnosis not present

## 2021-08-02 DIAGNOSIS — Z3482 Encounter for supervision of other normal pregnancy, second trimester: Secondary | ICD-10-CM | POA: Diagnosis not present

## 2021-08-03 DIAGNOSIS — M5489 Other dorsalgia: Secondary | ICD-10-CM | POA: Diagnosis not present

## 2021-08-06 ENCOUNTER — Ambulatory Visit: Payer: 59 | Admitting: *Deleted

## 2021-08-06 ENCOUNTER — Encounter: Payer: Self-pay | Admitting: *Deleted

## 2021-08-06 ENCOUNTER — Other Ambulatory Visit: Payer: Self-pay

## 2021-08-06 ENCOUNTER — Ambulatory Visit: Payer: 59 | Attending: Obstetrics

## 2021-08-06 VITALS — BP 135/69 | HR 81

## 2021-08-06 DIAGNOSIS — Z3A32 32 weeks gestation of pregnancy: Secondary | ICD-10-CM | POA: Diagnosis not present

## 2021-08-06 DIAGNOSIS — Z362 Encounter for other antenatal screening follow-up: Secondary | ICD-10-CM | POA: Insufficient documentation

## 2021-08-06 DIAGNOSIS — Z6841 Body Mass Index (BMI) 40.0 and over, adult: Secondary | ICD-10-CM

## 2021-08-06 DIAGNOSIS — O99212 Obesity complicating pregnancy, second trimester: Secondary | ICD-10-CM | POA: Diagnosis not present

## 2021-08-06 DIAGNOSIS — O99213 Obesity complicating pregnancy, third trimester: Secondary | ICD-10-CM | POA: Diagnosis not present

## 2021-08-06 DIAGNOSIS — E669 Obesity, unspecified: Secondary | ICD-10-CM

## 2021-08-10 ENCOUNTER — Other Ambulatory Visit: Payer: Self-pay | Admitting: *Deleted

## 2021-08-10 DIAGNOSIS — R638 Other symptoms and signs concerning food and fluid intake: Secondary | ICD-10-CM

## 2021-08-13 ENCOUNTER — Other Ambulatory Visit: Payer: Self-pay

## 2021-08-13 ENCOUNTER — Encounter: Payer: Self-pay | Admitting: Obstetrics and Gynecology

## 2021-08-13 ENCOUNTER — Ambulatory Visit (INDEPENDENT_AMBULATORY_CARE_PROVIDER_SITE_OTHER): Payer: 59 | Admitting: Obstetrics and Gynecology

## 2021-08-13 VITALS — BP 128/83 | HR 110 | Wt 232.7 lb

## 2021-08-13 DIAGNOSIS — Z3A33 33 weeks gestation of pregnancy: Secondary | ICD-10-CM

## 2021-08-13 DIAGNOSIS — Z3483 Encounter for supervision of other normal pregnancy, third trimester: Secondary | ICD-10-CM

## 2021-08-13 LAB — POCT URINALYSIS DIPSTICK OB
Bilirubin, UA: NEGATIVE
Blood, UA: NEGATIVE
Glucose, UA: NEGATIVE
Ketones, UA: NEGATIVE
Nitrite, UA: NEGATIVE
Spec Grav, UA: 1.015 (ref 1.010–1.025)
Urobilinogen, UA: 0.2 E.U./dL
pH, UA: 7 (ref 5.0–8.0)

## 2021-08-13 NOTE — Progress Notes (Signed)
ROB: Doing well.  Denies problems.  Reports daily fetal movement.  Ultrasound last week = 24%.  NSTs previously scheduled for next week.

## 2021-08-13 NOTE — Progress Notes (Signed)
   OB-Pt present for routine prenatal care. Pt stated fetal movement present; braxton hick contractions present; no vaginal bleeding and no changes in vaginal discharge.     Pt c/o pelvic/vaginal pain and pressure.

## 2021-08-19 NOTE — Patient Instructions (Addendum)
Third Trimester of Pregnancy The third trimester of pregnancy is from week 28 through week 68. This is also called months 7 through 9. This trimester is when your unborn baby (fetus) is growing very fast. At the end of the ninth month, the unborn baby is about 20 inches long. It weighs about 6-10 pounds. Body changes during your third trimester Your body continues to go through many changes during this time. The changes vary and generally return to normal after the baby is born. Physical changes Your weight will continue to increase. You may gain 25-35 pounds (11-16 kg) by the end of the pregnancy. If you are underweight, you may gain 28-40 lb (about 13-18 kg). If you are overweight, you may gain 15-25 lb (about 7-11 kg). You may start to get stretch marks on your hips, belly (abdomen), and breasts. Your breasts will continue to grow and may hurt. A yellow fluid (colostrum) may leak from your breasts. This is the first milk you are making for your baby. You may have changes in your hair. Your belly button may stick out. You may have more swelling in your hands, face, or ankles. Health changes You may have heartburn. You may have trouble pooping (constipation). You may get hemorrhoids. These are swollen veins in the butt that can itch or get painful. You may have swollen veins (varicose veins) in your legs. You may have more body aches in the pelvis, back, or thighs. You may have more tingling or numbness in your hands, arms, and legs. The skin on your belly may also feel numb. You may feel short of breath as your womb (uterus) gets bigger. Other changes You may pee (urinate) more often. You may have more problems sleeping. You may notice the unborn baby "dropping," or moving lower in your belly. You may have more discharge coming from your vagina. Your joints may feel loose, and you may have pain around your pelvic bone. Follow these instructions at home: Medicines Take over-the-counter  and prescription medicines only as told by your doctor. Some medicines are not safe during pregnancy. Take a prenatal vitamin that contains at least 600 micrograms (mcg) of folic acid. Eating and drinking Eat healthy meals that include: Fresh fruits and vegetables. Whole grains. Good sources of protein, such as meat, eggs, or tofu. Low-fat dairy products. Avoid raw meat and unpasteurized juice, milk, and cheese. These carry germs that can harm you and your baby. Eat 4 or 5 small meals rather than 3 large meals a day. You may need to take these actions to prevent or treat trouble pooping: Drink enough fluids to keep your pee (urine) pale yellow. Eat foods that are high in fiber. These include beans, whole grains, and fresh fruits and vegetables. Limit foods that are high in fat and sugar. These include fried or sweet foods. Activity Exercise only as told by your doctor. Stop exercising if you start to have cramps in your womb. Avoid heavy lifting. Do not exercise if it is too hot or too humid, or if you are in a place of great height (high altitude). If you choose to, you may have sex unless your doctor tells you not to. Relieving pain and discomfort Take breaks often, and rest with your legs raised (elevated) if you have leg cramps or low back pain. Take warm water baths (sitz baths) to soothe pain or discomfort caused by hemorrhoids. Use hemorrhoid cream if your doctor approves. Wear a good support bra if your breasts are tender.  If you develop bulging, swollen veins in your legs: Wear support hose as told by your doctor. Raise your feet for 15 minutes, 3-4 times a day. Limit salt in your food. Safety Talk to your doctor before traveling far distances. Do not use hot tubs, steam rooms, or saunas. Wear your seat belt at all times when you are in a car. Talk with your doctor if someone is hurting you or yelling at you a lot. Preparing for your baby's arrival To prepare for the arrival  of your baby: Take prenatal classes. Visit the hospital and tour the maternity area. Buy a rear-facing car seat. Learn how to install it in your car. Prepare the baby's room. Take out all pillows and stuffed animals from the baby's crib. General instructions Avoid cat litter boxes and soil used by cats. These carry germs that can cause harm to the baby and can cause a loss of your baby by miscarriage or stillbirth. Do not douche or use tampons. Do not use scented sanitary pads. Do not smoke or use any products that contain nicotine or tobacco. If you need help quitting, ask your doctor. Do not drink alcohol. Do not use herbal medicines, illegal drugs, or medicines that were not approved by your doctor. Chemicals in these products can affect your baby. Keep all follow-up visits. This is important. Where to find more information American Pregnancy Association: americanpregnancy.org [employer]: www.acog.org Office on Women's Health: KeywordPortfolios.com.br Contact a doctor if: You have a fever. You have mild cramps or pressure in your lower belly. You have a nagging pain in your belly area. You vomit, or you have watery poop (diarrhea). You have bad-smelling fluid coming from your vagina. You have pain when you pee, or your pee smells bad. You have a headache that does not go away when you take medicine. You have changes in how you see, or you see spots in front of your eyes. Get help right away if: Your water breaks. You have regular contractions that are less than 5 minutes apart. You are spotting or bleeding from your vagina. You have very bad belly cramps or pain. You have trouble breathing. You have chest pain. You faint. You have not felt the baby move for the amount of time told by your doctor. You have new or increased pain, swelling, or redness in an arm or leg. Summary The third trimester is from week 28 through week 40 (months 7  through 9). This is the time when your unborn baby is growing very fast. During this time, your discomfort may increase as you gain weight and as your baby grows. Get ready for your baby to arrive by taking prenatal classes, buying a rear-facing car seat, and preparing the baby's room. Get help right away if you are bleeding from your vagina, you have chest pain and trouble breathing, or you have not felt the baby move for the amount of time told by your doctor. This information is not intended to replace advice given to you by your health care provider. Make sure you discuss any questions you have with your health care provider. Document Revised: 04/29/2020 Document Reviewed: 03/05/2020 Elsevier Patient Education  2022 [place]. Breastfeeding and Breast Care It is normal to have some problems when you start to breastfeed your new baby. But there are things that you can do to take care of yourself and help prevent problems. This includes keeping your breasts healthy and making sure that your baby's  mouth attaches (latches) properly to your nipple for feedings. Work with your doctor or breastfeeding specialist to find what works best for you. How does self-care benefit me? If you keep your breasts healthy and you let your baby attach to your nipples in the right way, you will avoid these problems: Cracked or sore nipples. Breasts becoming overfilled with milk. Plugged milk ducts. Low milk supply. Breast swelling or infection. How does self-care benefit my baby? By preventing problems with your breasts, you will ensure that your baby will feed well and will gain the right amount of weight. What actions can I take to care for myself during breastfeeding? Best ways to breastfeed Always make sure that your baby latches properly to breastfeed. Make sure that your baby is in a proper position. Try different breastfeeding positions to find one that works best for you and your baby. Breastfeed  when you feel like you need to make your breasts less full or when your baby shows signs of hunger. This is called "breastfeeding on demand." Do not delay feedings. Try to relax when it is time to feed your baby. This helps your body release milk from your breast. To help increase milk flow, do these things before feeding: Remove a small amount of milk from your breast. Use a pump or squeeze with your hand. Apply warm, moist heat to your breast. Do this in the shower or use hand towels soaked with warm water. Massage your breasts. Do this when you are breastfeeding as well. Caring for your breasts   To help your breasts stay healthy and keep them from getting too dry: Avoid using soap on your nipples. Let your nipples air-dry for 3-4 minutes after each feeding. Do not use things like a hair dryer to dry your breasts. This can make the skin dry and will cause irritation and pain. Use only cotton bra pads to soak up breast milk that leaks. Change the pads if they become soaked with milk. If you use bra pads that can be thrown away, change them often. Put some lanolin on your nipples after breastfeeding. Pure lanolin does not need to be washed off your nipple before you feed your baby again. Pure lanolin is not harmful to your baby. Rub some breast milk into your nipples: Use your hand to squeeze out a few drops of breast milk. Gently massage the milk into your nipples. Let your nipples air-dry. Wear a supportive nursing bra. Avoid wearing: Tight clothing. Underwire bras or bras that put pressure on your breasts. Use ice to help relieve pain or swelling of your breasts: Put ice in a plastic bag. Place a towel between your skin and the bag. Leave the ice on for 20 minutes, 2-3 times a day. Follow these instructions at home: Drink enough fluid to keep your pee (urine) pale yellow. Get plenty of rest. Sleep when your baby sleeps. Talk to your doctor or breastfeeding specialist before taking  any herbal supplements. Eat a balanced diet. This includes fruits, vegetables, whole grains, lean proteins, and dairy or dairy alternatives Contact a health care provider if: You have nipple pain. You have cracking or soreness in your nipples that lasts longer than 1 week. Your breasts are overfilled with milk, and this lasts longer than 48 hours. You have a fever. You have pus-like fluid coming from your nipple. You have redness, a rash, swelling, itching, or burning on your breast. Your baby does not gain weight. Your baby loses weight. Your baby is  not feeding regularly or is very sleepy and lacks energy. Summary There are things that you can do to take care of yourself and help prevent many common breastfeeding problems. Always make sure that your baby's mouth attaches (latches) to your nipple properly to breastfeed. Keep your nipples from getting too dry, drink plenty of fluid, and get plenty of rest. Feed on demand. Do not delay feedings. This information is not intended to replace advice given to you by your health care provider. Make sure you discuss any questions you have with your health care provider. Document Revised: 05/12/2020 Document Reviewed: 05/12/2020 Elsevier Patient Education  [address]. Common Medications Safe in Pregnancy  Acne:      Constipation:  Benzoyl Peroxide     Colace  Clindamycin      Dulcolax Suppository  Topica Erythromycin     Fibercon  Salicylic Acid      Metamucil         Miralax AVOID:        Senakot   Accutane    Cough:  Retin-A       Cough Drops  Tetracycline      Phenergan w/ Codeine if Rx  Minocycline      Robitussin (Plain & DM)  Antibiotics:     Crabs/Lice:  Ceclor       RID  Cephalosporins    AVOID:  E-Mycins      Kwell  Keflex  Macrobid/Macrodantin   Diarrhea:  Penicillin      Kao-Pectate  Zithromax      Imodium AD         PUSH FLUIDS AVOID:       Cipro     Fever:  Tetracycline      Tylenol (Regular or  Extra  Minocycline       Strength)  Levaquin      Extra Strength-Do not          Exceed 8 tabs/24 hrs Caffeine:        <24m/day (equiv. To 1 cup of coffee or  approx. 3 12 oz sodas)         Gas: Cold/Hayfever:       Gas-X  Benadryl      Mylicon  Claritin       Phazyme  **Claritin-D        Chlor-Trimeton    Headaches:  Dimetapp      ASA-Free Excedrin  Drixoral-Non-Drowsy     Cold Compress  Mucinex (Guaifenasin)     Tylenol (Regular or Extra  Sudafed/Sudafed-12 Hour     Strength)  **Sudafed PE Pseudoephedrine   Tylenol Cold & Sinus     Vicks Vapor Rub  Zyrtec  **AVOID if Problems With Blood Pressure         Heartburn: Avoid lying down for at least 1 hour after meals  Aciphex      Maalox     Rash:  Milk of Magnesia     Benadryl    Mylanta       1% Hydrocortisone Cream  Pepcid  Pepcid Complete   Sleep Aids:  Prevacid      Ambien   Prilosec       Benadryl  Rolaids       Chamomile Tea  Tums (Limit 4/day)     Unisom         Tylenol PM         Warm milk-add vanilla or  Hemorrhoids:       Sugar for  taste  Anusol/Anusol H.C.  (RX: Analapram 2.5%)  Sugar Substitutes:  Hydrocortisone OTC     Ok in moderation  Preparation H      Tucks        Vaseline lotion applied to tissue with wiping    Herpes:     Throat:  Acyclovir      Oragel  Famvir  Valtrex     Vaccines:         Flu Shot Leg Cramps:       *Gardasil  Benadryl      Hepatitis A         Hepatitis B Nasal Spray:       Pneumovax  Saline Nasal Spray     Polio Booster         Tetanus Nausea:       Tuberculosis test or PPD  Vitamin B6 25 mg TID   AVOID:    Dramamine      *Gardasil  Emetrol       Live Poliovirus  Ginger Root 250 mg QID    MMR (measles, mumps &  High Complex Carbs @ Bedtime    rebella)  Sea Bands-Accupressure    Varicella (Chickenpox)  Unisom 1/2 tab TID     *No known complications           If received before Pain:         Known pregnancy;   Darvocet       Resume series  after  Lortab        Delivery  Percocet    Yeast:   Tramadol      Femstat  Tylenol 3      Gyne-lotrimin  Ultram       Monistat  Vicodin           MISC:         All Sunscreens           Hair Coloring/highlights          Insect Repellant's          (Including DEET)         Mystic Tans    Influenza (Flu) Vaccine (Inactivated or Recombinant): What You Need to Know 1. Why get vaccinated? Influenza vaccine can prevent influenza (flu). Flu is a contagious disease that spreads around the [place] every year, usually between October and May. Anyone can get the flu, but it is more dangerous for some people. Infants and young children, people 46 years and older, pregnant people, and people with certain health conditions or a weakened immune system are at greatest risk of flu complications. Pneumonia, bronchitis, sinus infections, and ear infections are examples of flu-related complications. If you have a medical condition, such as heart disease, cancer, or diabetes, flu can make it worse. Flu can cause fever and chills, sore throat, muscle aches, fatigue, cough, headache, and runny or stuffy nose. Some people may have vomiting and diarrhea, though this is more common in children than adults. In an average year, thousands of people in the [place] die from flu, and many more are hospitalized. Flu vaccine prevents millions of illnesses and flu-related visits to the doctor each year. 2. Influenza vaccines CDC recommends everyone 6 months and older get vaccinated every flu season. Children 6 months through 43 years of age may need 2 doses during a single flu season. Everyone else needs only 1 dose each flu season. It takes about 2 weeks for protection to develop after vaccination. There  are many flu viruses, and they are always changing. Each year a new flu vaccine is made to protect against the influenza viruses believed to be likely to cause disease in the upcoming flu season. Even when the  vaccine doesn't exactly match these viruses, it may still provide some protection. Influenza vaccine does not cause flu. Influenza vaccine may be given at the same time as other vaccines. 3. Talk with your health care provider Tell your vaccination provider if the person getting the vaccine: Has had an allergic reaction after a previous dose of influenza vaccine, or has any severe, life-threatening allergies Has ever had Guillain-Barr Syndrome (also called "GBS") In some cases, your health care provider may decide to postpone influenza vaccination until a future visit. Influenza vaccine can be administered at any time during pregnancy. People who are or will be pregnant during influenza season should receive inactivated influenza vaccine. People with minor illnesses, such as a cold, may be vaccinated. People who are moderately or severely ill should usually wait until they recover before getting influenza vaccine. Your health care provider can give you more information. 4. Risks of a vaccine reaction Soreness, redness, and swelling where the shot is given, fever, muscle aches, and headache can happen after influenza vaccination. There may be a very small increased risk of Guillain-Barr Syndrome (GBS) after inactivated influenza vaccine (the flu shot). Young children who get the flu shot along with pneumococcal vaccine (PCV13) and/or DTaP vaccine at the same time might be slightly more likely to have a seizure caused by fever. Tell your health care provider if a child who is getting flu vaccine has ever had a seizure. People sometimes faint after medical procedures, including vaccination. Tell your provider if you feel dizzy or have vision changes or ringing in the ears. As with any medicine, there is a very remote chance of a vaccine causing a severe allergic reaction, other serious injury, or death. 5. What if there is a serious problem? An allergic reaction could occur after the vaccinated  person leaves the clinic. If you see signs of a severe allergic reaction (hives, swelling of the face and throat, difficulty breathing, a fast heartbeat, dizziness, or weakness), call 9-1-1 and get the person to the nearest hospital. For other signs that concern you, call your health care provider. Adverse reactions should be reported to the Vaccine Adverse Event Reporting System (VAERS). Your health care provider will usually file this report, or you can do it yourself. Visit the VAERS website at www.vaers.SamedayNews.es or call (740) 784-5898. VAERS is only for reporting reactions, and VAERS staff members do not give medical advice. 6. The National Vaccine Injury Compensation Program The [employer] Vaccine Injury Compensation Program (VICP) is a federal program that was created to compensate people who may have been injured by certain vaccines. Claims regarding alleged injury or death due to vaccination have a time limit for filing, which may be as short as two years. Visit the VICP website at GoldCloset.com.ee or call 364-221-8748 to learn about the program and about filing a claim. 7. How can I learn more? Ask your health care provider. Call your local or state health department. Visit the website of the Food and Drug Administration (FDA) for vaccine package inserts and additional information at TraderRating.uy. Contact the Centers for Disease Control and Prevention (CDC): Call (313) 315-8129 (1-800-CDC-INFO) or Visit CDC's website at https://gibson.com/. Vaccine Information Statement Inactivated Influenza Vaccine (07/10/2020) This information is not intended to replace advice given to you by your health care  provider. Make sure you discuss any questions you have with your health care provider. Document Revised: 08/27/2020 Document Reviewed: 08/27/2020 Elsevier Patient Education  2022 [employer].

## 2021-08-20 ENCOUNTER — Encounter: Payer: Self-pay | Admitting: Certified Nurse Midwife

## 2021-08-20 ENCOUNTER — Ambulatory Visit: Payer: 59

## 2021-08-20 ENCOUNTER — Ambulatory Visit (INDEPENDENT_AMBULATORY_CARE_PROVIDER_SITE_OTHER): Payer: 59 | Admitting: Certified Nurse Midwife

## 2021-08-20 ENCOUNTER — Other Ambulatory Visit: Payer: 59

## 2021-08-20 ENCOUNTER — Other Ambulatory Visit: Payer: Self-pay

## 2021-08-20 VITALS — BP 138/84 | HR 109 | Wt 232.1 lb

## 2021-08-20 DIAGNOSIS — Z3483 Encounter for supervision of other normal pregnancy, third trimester: Secondary | ICD-10-CM

## 2021-08-20 DIAGNOSIS — O99210 Obesity complicating pregnancy, unspecified trimester: Secondary | ICD-10-CM | POA: Diagnosis not present

## 2021-08-20 DIAGNOSIS — Z23 Encounter for immunization: Secondary | ICD-10-CM | POA: Diagnosis not present

## 2021-08-20 DIAGNOSIS — Z3A34 34 weeks gestation of pregnancy: Secondary | ICD-10-CM

## 2021-08-20 LAB — POCT URINALYSIS DIPSTICK OB
Bilirubin, UA: NEGATIVE
Blood, UA: NEGATIVE
Glucose, UA: NEGATIVE
Ketones, UA: NEGATIVE
Leukocytes, UA: NEGATIVE
Nitrite, UA: NEGATIVE
Spec Grav, UA: 1.02 (ref 1.010–1.025)
Urobilinogen, UA: 0.2 E.U./dL
pH, UA: 6.5 (ref 5.0–8.0)

## 2021-08-20 NOTE — Progress Notes (Signed)
OB-pt present for routine prenatal care and NST. Flu vaccine administered and documented.

## 2021-08-20 NOTE — Progress Notes (Signed)
ROB and NST for obesity in pregnancy. NST reactive: category 1  Baseline 130 Variability: Moderate Accelerations: present Decelerations absent.   CTX: absent  Discussed follow up NST 1 wk   [name], CNM

## 2021-08-27 ENCOUNTER — Other Ambulatory Visit: Payer: Self-pay

## 2021-08-27 ENCOUNTER — Ambulatory Visit (INDEPENDENT_AMBULATORY_CARE_PROVIDER_SITE_OTHER): Payer: 59 | Admitting: Certified Nurse Midwife

## 2021-08-27 ENCOUNTER — Other Ambulatory Visit: Payer: 59

## 2021-08-27 VITALS — BP 114/78 | HR 94 | Wt 232.0 lb

## 2021-08-27 DIAGNOSIS — O99210 Obesity complicating pregnancy, unspecified trimester: Secondary | ICD-10-CM

## 2021-08-27 DIAGNOSIS — Z3483 Encounter for supervision of other normal pregnancy, third trimester: Secondary | ICD-10-CM | POA: Diagnosis not present

## 2021-08-27 DIAGNOSIS — Z3A35 35 weeks gestation of pregnancy: Secondary | ICD-10-CM

## 2021-08-27 NOTE — Progress Notes (Signed)
ROB and NST for elevated BM in pregnancy. Pt has u/s with MFM on 9/29 for growth. Will follow up with rests. Discussed recommendation for induction at 39 wks, pt would like a natural labor but state she went into labor with her first @ 38 wks. Discussed cervical exam and a possible stripping of membranes to enhance potential of natural labor. Recommend start for cervical exam at 37 wks. She agrees.   NST: Reactive Baseline 125 Moderate variability Accelerations present Decelerations absent  Ctx: absent.

## 2021-08-27 NOTE — Patient Instructions (Signed)

## 2021-08-30 ENCOUNTER — Telehealth: Payer: Self-pay

## 2021-08-30 NOTE — Telephone Encounter (Signed)
Copied from CRM [id number]. Topic: Appointment Scheduling - Scheduling Inquiry for Clinic >> Aug 30, 2021  3:29 PM Gaetana Michaelis A wrote: Reason for CRM: Patient would like to be contacted regarding rescheduling their new patient appt for 09/01/21  The agent was unable to successfully reschedule at the time of call  Please contact further when available

## 2021-09-01 ENCOUNTER — Ambulatory Visit: Payer: 59 | Admitting: Family Medicine

## 2021-09-02 ENCOUNTER — Other Ambulatory Visit: Payer: Self-pay

## 2021-09-02 ENCOUNTER — Encounter: Payer: Self-pay | Admitting: *Deleted

## 2021-09-02 ENCOUNTER — Ambulatory Visit: Payer: 59 | Admitting: *Deleted

## 2021-09-02 ENCOUNTER — Ambulatory Visit: Payer: 59 | Attending: Obstetrics

## 2021-09-02 VITALS — BP 127/74 | HR 95

## 2021-09-02 DIAGNOSIS — R638 Other symptoms and signs concerning food and fluid intake: Secondary | ICD-10-CM | POA: Insufficient documentation

## 2021-09-02 DIAGNOSIS — O99213 Obesity complicating pregnancy, third trimester: Secondary | ICD-10-CM | POA: Diagnosis not present

## 2021-09-02 DIAGNOSIS — E669 Obesity, unspecified: Secondary | ICD-10-CM

## 2021-09-02 DIAGNOSIS — Z6841 Body Mass Index (BMI) 40.0 and over, adult: Secondary | ICD-10-CM | POA: Diagnosis not present

## 2021-09-02 DIAGNOSIS — Z3A36 36 weeks gestation of pregnancy: Secondary | ICD-10-CM

## 2021-09-02 NOTE — Telephone Encounter (Signed)
Pt scheduled  

## 2021-09-03 ENCOUNTER — Ambulatory Visit (INDEPENDENT_AMBULATORY_CARE_PROVIDER_SITE_OTHER): Payer: 59 | Admitting: Certified Nurse Midwife

## 2021-09-03 ENCOUNTER — Other Ambulatory Visit: Payer: 59

## 2021-09-03 ENCOUNTER — Encounter: Payer: Self-pay | Admitting: Certified Nurse Midwife

## 2021-09-03 VITALS — BP 124/74 | HR 116 | Wt 229.4 lb

## 2021-09-03 DIAGNOSIS — R69 Illness, unspecified: Secondary | ICD-10-CM | POA: Diagnosis not present

## 2021-09-03 DIAGNOSIS — Z3A36 36 weeks gestation of pregnancy: Secondary | ICD-10-CM | POA: Diagnosis not present

## 2021-09-03 DIAGNOSIS — Z113 Encounter for screening for infections with a predominantly sexual mode of transmission: Secondary | ICD-10-CM

## 2021-09-03 DIAGNOSIS — O99210 Obesity complicating pregnancy, unspecified trimester: Secondary | ICD-10-CM

## 2021-09-03 DIAGNOSIS — Z3483 Encounter for supervision of other normal pregnancy, third trimester: Secondary | ICD-10-CM | POA: Diagnosis not present

## 2021-09-03 LAB — POCT URINALYSIS DIPSTICK OB
Bilirubin, UA: NEGATIVE
Blood, UA: NEGATIVE
Glucose, UA: NEGATIVE
Ketones, UA: NEGATIVE
Leukocytes, UA: NEGATIVE
Nitrite, UA: NEGATIVE
POC,PROTEIN,UA: NEGATIVE
Spec Grav, UA: 1.01 (ref 1.010–1.025)
Urobilinogen, UA: 0.2 E.U./dL
pH, UA: 7 (ref 5.0–8.0)

## 2021-09-03 NOTE — Patient Instructions (Signed)

## 2021-09-03 NOTE — Progress Notes (Signed)
Body mass index is 39.38 kg/m.  ROB doing well, MFM appointment yesterday. Notes reviewed. NST today reactive . Baseline 135, moderate varirability, accelerations present, decelerations absent. No ctx noted. GBS and cultures today. Pt has meet both MDs, She is agreeable to induction 39 wks. Will scheduled next visit. Follow up 1 wk.    Impression  Prepregnancy BMI 41.  Amniotic fluid is normal and good fetal activity is seen .Fetal  growth is appropriate for gestational age .Antenatal testing is  reassuring. BPP 8/8.  She does not have gestational diabetes.

## 2021-09-05 LAB — STREP GP B NAA: Strep Gp B NAA: NEGATIVE

## 2021-09-08 ENCOUNTER — Encounter: Payer: 59 | Admitting: Certified Nurse Midwife

## 2021-09-08 ENCOUNTER — Other Ambulatory Visit: Payer: 59

## 2021-09-08 LAB — GC/CHLAMYDIA PROBE AMP
Chlamydia trachomatis, NAA: NEGATIVE
Neisseria Gonorrhoeae by PCR: NEGATIVE

## 2021-09-09 ENCOUNTER — Encounter: Payer: 59 | Admitting: Obstetrics and Gynecology

## 2021-09-09 ENCOUNTER — Other Ambulatory Visit: Payer: 59

## 2021-09-10 ENCOUNTER — Ambulatory Visit (INDEPENDENT_AMBULATORY_CARE_PROVIDER_SITE_OTHER): Payer: 59 | Admitting: Certified Nurse Midwife

## 2021-09-10 ENCOUNTER — Other Ambulatory Visit: Payer: 59

## 2021-09-10 ENCOUNTER — Other Ambulatory Visit: Payer: Self-pay

## 2021-09-10 ENCOUNTER — Encounter: Payer: Self-pay | Admitting: Certified Nurse Midwife

## 2021-09-10 VITALS — BP 110/78 | HR 96 | Wt 229.6 lb

## 2021-09-10 DIAGNOSIS — O99210 Obesity complicating pregnancy, unspecified trimester: Secondary | ICD-10-CM | POA: Diagnosis not present

## 2021-09-10 DIAGNOSIS — Z3483 Encounter for supervision of other normal pregnancy, third trimester: Secondary | ICD-10-CM

## 2021-09-10 DIAGNOSIS — Z3A37 37 weeks gestation of pregnancy: Secondary | ICD-10-CM

## 2021-09-10 LAB — POCT URINALYSIS DIPSTICK OB
Bilirubin, UA: NEGATIVE
Blood, UA: NEGATIVE
Glucose, UA: NEGATIVE
Ketones, UA: NEGATIVE
Leukocytes, UA: NEGATIVE
Nitrite, UA: NEGATIVE
Spec Grav, UA: 1.015 (ref 1.010–1.025)
Urobilinogen, UA: 0.2 E.U./dL
pH, UA: 6 (ref 5.0–8.0)

## 2021-09-10 NOTE — Progress Notes (Signed)
ROB and NST for elevated BMI . SVE per pt request 3/60/-2. Labor precautions reviewed. Follow up 1 wk Body mass index is 39.41 kg/m. Induction scheduled 10/20 @ 0500.    NST: reactive Baseline: 130 Moderate variability Accelerations present Declarations absent  CTX: absent

## 2021-09-10 NOTE — Patient Instructions (Signed)
Braxton Hicks Contractions Contractions of the uterus can occur throughout pregnancy, but they are not always a sign that you are in labor. You may have practice contractions called Braxton Hicks contractions. These false labor contractions are sometimes confused with true labor. What are Braxton Hicks contractions? Braxton Hicks contractions are tightening movements that occur in the muscles of the uterus before labor. Unlike true labor contractions, these contractions do not result in opening (dilation) and thinning of the lowest part of the uterus (cervix). Toward the end of pregnancy (32-34 weeks), Braxton Hicks contractions can happen more often and may become stronger. These contractions are sometimes difficult to tell apart from true labor because they can be very uncomfortable. How to tell the difference between true labor and false labor True labor Contractions last 30-70 seconds. Contractions become very regular. Discomfort is usually felt in the top of the uterus, and it spreads to the lower abdomen and low back. Contractions do not go away with walking. Contractions usually become stronger and more frequent. The cervix dilates and gets thinner. False labor Contractions are usually shorter, weaker, and farther apart than true labor contractions. Contractions are usually irregular. Contractions are often felt in the front of the lower abdomen and in the groin. Contractions may go away when you walk around or change positions while lying down. The cervix usually does not dilate or become thin. Sometimes, the only way to tell if you are in true labor is for your health care provider to look for changes in your cervix. Your health care provider will do a physical exam and may monitor your contractions. If you are in true labor, your health care provider will send you home with instructions about when to return to the hospital. You may continue to have Braxton Hicks contractions until you  go into true labor. Follow these instructions at home:  Take over-the-counter and prescription medicines only as told by your health care provider. If Braxton Hicks contractions are making you uncomfortable: Change your position from lying down or resting to walking, or change from walking to resting. Sit and rest in a tub of warm water. Drink enough fluid to keep your urine pale yellow. Dehydration may cause these contractions. Do slow and deep breathing several times an hour. Keep all follow-up visits. This is important. Contact a health care provider if: You have a fever. You have continuous pain in your abdomen. Your contractions become stronger, more regular, and closer together. You pass blood-tinged mucus. Get help right away if: You have fluid leaking or gushing from your vagina. You have bright red blood coming from your vagina. Your baby is not moving inside you as much as it used to. Summary You may have practice contractions called Braxton Hicks contractions. These false labor contractions are sometimes confused with true labor. Braxton Hicks contractions are usually shorter, weaker, farther apart, and less regular than true labor contractions. True labor contractions usually become stronger, more regular, and more frequent. Manage discomfort from Braxton Hicks contractions by changing position, resting in a warm bath, practicing deep breathing, and drinking plenty of water. Keep all follow-up visits. Contact your health care provider if your contractions become stronger, more regular, and closer together. This information is not intended to replace advice given to you by your health care provider. Make sure you discuss any questions you have with your health care provider. Document Revised: 09/28/2020 Document Reviewed: 09/28/2020 Elsevier Patient Education  2022 Elsevier Inc.  

## 2021-09-15 ENCOUNTER — Encounter: Payer: 59 | Admitting: Certified Nurse Midwife

## 2021-09-15 ENCOUNTER — Other Ambulatory Visit: Payer: 59

## 2021-09-17 ENCOUNTER — Ambulatory Visit (INDEPENDENT_AMBULATORY_CARE_PROVIDER_SITE_OTHER): Payer: 59 | Admitting: Certified Nurse Midwife

## 2021-09-17 ENCOUNTER — Other Ambulatory Visit: Payer: Self-pay

## 2021-09-17 ENCOUNTER — Other Ambulatory Visit: Payer: 59

## 2021-09-17 VITALS — BP 112/74 | HR 102 | Wt 231.2 lb

## 2021-09-17 DIAGNOSIS — Z3483 Encounter for supervision of other normal pregnancy, third trimester: Secondary | ICD-10-CM

## 2021-09-17 DIAGNOSIS — O99210 Obesity complicating pregnancy, unspecified trimester: Secondary | ICD-10-CM

## 2021-09-17 DIAGNOSIS — Z3A38 38 weeks gestation of pregnancy: Secondary | ICD-10-CM

## 2021-09-17 LAB — POCT URINALYSIS DIPSTICK OB
Bilirubin, UA: NEGATIVE
Blood, UA: NEGATIVE
Glucose, UA: NEGATIVE
Ketones, UA: NEGATIVE
Leukocytes, UA: NEGATIVE
Nitrite, UA: NEGATIVE
POC,PROTEIN,UA: NEGATIVE
Spec Grav, UA: 1.01 (ref 1.010–1.025)
Urobilinogen, UA: 0.2 E.U./dL
pH, UA: 7 (ref 5.0–8.0)

## 2021-09-17 NOTE — Addendum Note (Signed)
Addended by: Mechele Claude on: 09/17/2021 10:53 AM   Modules accepted: Orders, SmartSet

## 2021-09-17 NOTE — Progress Notes (Signed)
ROB and NST for elevated BMI. SVE per pt request 3-4 /70/-2   NST reactive  Baseline 140 Accelerations present Decelerations absent  Ctx: absent.

## 2021-09-17 NOTE — Patient Instructions (Signed)
Labor Induction ?Labor induction is when steps are taken to cause a pregnant woman to begin the labor process. Most women go into labor on their own between 37 weeks and 42 weeks of pregnancy. When this does not happen, or when there is a medical need for labor to begin, steps may be taken to induce, or bring on, labor. ?Labor induction causes a pregnant woman's uterus to contract. It also causes the cervix to soften (ripen), open (dilate), and thin out. Usually, labor is not induced before 39 weeks of pregnancy unless there is a medical reason to do so. ?When is labor induction considered? ?Labor induction may be right for you if: ?Your pregnancy lasts longer than 41 to 42 weeks. ?Your placenta is separating from your uterus (placental abruption). ?You have a rupture of membranes and your labor does not begin. ?You have health problems, like diabetes or high blood pressure (preeclampsia) during your pregnancy. ?Your baby has stopped growing or does not have enough amniotic fluid. ?Before labor induction begins, your health care provider will consider the following factors: ?Your medical condition and the baby's condition. ?How many weeks you have been pregnant. ?How mature the baby's lungs are. ?The condition of your cervix. ?The position of the baby. ?The size of your birth canal. ?Tell a health care provider about: ?Any allergies you have. ?All medicines you are taking, including vitamins, herbs, eye drops, creams, and over-the-counter medicines. ?Any problems you or your family members have had with anesthetic medicines. ?Any surgeries you have had. ?Any blood disorders you have. ?Any medical conditions you have. ?What are the risks? ?Generally, this is a safe procedure. However, problems may occur, including: ?Failed induction. ?Changes in fetal heart rate, such as being too high, too low, or irregular (erratic). ?Infection in the mother or the baby. ?Increased risk of having a cesarean delivery. ?Breaking off  (abruption) of the placenta from the uterus. This is rare. ?Rupture of the uterus. This is very rare. ?Your baby could fail to get enough blood flow or oxygen. This can be life-threatening. ?When induction is needed for medical reasons, the benefits generally outweigh the risks. ?What happens during the procedure? ?During the procedure, your health care provider will use one of these methods to induce labor: ?Stripping the membranes. In this method, the amniotic sac tissue is gently separated from the cervix. This causes the following to happen: ?Your cervix stretches, which in turn causes the release of prostaglandins. ?Prostaglandins induce labor and cause the uterus to contract. ?This procedure is often done in an office visit. You will be sent home to wait for contractions to begin. ?Prostaglandin medicine. This medicine starts contractions and causes the cervix to dilate and ripen. This can be taken by mouth (orally) or by being inserted into the vagina (suppository). ?Inserting a small, thin tube (catheter) with a balloon into the vagina and then expanding the balloon with water to dilate the cervix. ?Breaking the water. In this method, a small instrument is used to make a small hole in the amniotic sac. This eventually causes the amniotic sac to break. Contractions should begin within a few hours. ?Medicine to trigger or strengthen contractions. This medicine is given through an IV that is inserted into a vein in your arm. ?This procedure may vary among health care providers and hospitals. ?Where to find more information ?[employer] of D[employer]: www.marchofdimes.org ?The American College of Obstetricians and Gynecologists: www.acog.org ?Summary ?Labor induction causes a pregnant woman's uterus to contract. It also causes the cervix   to soften (ripen), open (dilate), and thin out. ?Labor is usually not induced before 39 weeks of pregnancy unless there is a medical reason to do so. ?When induction is needed for medical  reasons, the benefits generally outweigh the risks. ?Talk with your health care provider about which methods of labor induction are right for you. ?This information is not intended to replace advice given to you by your health care provider. Make sure you discuss any questions you have with your health care provider. ?Document Revised: 09/03/2020 Document Reviewed: 09/03/2020 ?Elsevier Patient Education ? 2022 Elsevier Inc. ? ?

## 2021-09-21 ENCOUNTER — Other Ambulatory Visit
Admission: RE | Admit: 2021-09-21 | Discharge: 2021-09-21 | Disposition: A | Discharge status: Home / Self Care | Payer: 59 | Source: Ambulatory Visit | Attending: Certified Nurse Midwife | Admitting: Certified Nurse Midwife

## 2021-09-21 ENCOUNTER — Other Ambulatory Visit: Payer: Self-pay

## 2021-09-21 DIAGNOSIS — Z01812 Encounter for preprocedural laboratory examination: Secondary | ICD-10-CM | POA: Insufficient documentation

## 2021-09-21 DIAGNOSIS — Z20822 Contact with and (suspected) exposure to covid-19: Secondary | ICD-10-CM | POA: Insufficient documentation

## 2021-09-21 LAB — SARS CORONAVIRUS 2 (TAT 6-24 HRS): SARS Coronavirus 2: NEGATIVE

## 2021-09-22 ENCOUNTER — Encounter: Payer: 59 | Admitting: Certified Nurse Midwife

## 2021-09-22 ENCOUNTER — Other Ambulatory Visit: Payer: 59

## 2021-09-23 ENCOUNTER — Inpatient Hospital Stay
Admission: EM | Admit: 2021-09-23 | Discharge: 2021-09-24 | DRG: 807 | Disposition: A | Discharge status: Home / Self Care | Payer: 59 | Attending: Certified Nurse Midwife | Admitting: Certified Nurse Midwife

## 2021-09-23 ENCOUNTER — Inpatient Hospital Stay: Payer: 59 | Admitting: Anesthesiology

## 2021-09-23 ENCOUNTER — Other Ambulatory Visit: Payer: Self-pay

## 2021-09-23 ENCOUNTER — Encounter: Payer: Self-pay | Admitting: Certified Nurse Midwife

## 2021-09-23 DIAGNOSIS — E669 Obesity, unspecified: Secondary | ICD-10-CM

## 2021-09-23 DIAGNOSIS — O99214 Obesity complicating childbirth: Principal | ICD-10-CM | POA: Diagnosis present

## 2021-09-23 DIAGNOSIS — Z3A39 39 weeks gestation of pregnancy: Secondary | ICD-10-CM

## 2021-09-23 DIAGNOSIS — Z20822 Contact with and (suspected) exposure to covid-19: Secondary | ICD-10-CM | POA: Diagnosis present

## 2021-09-23 DIAGNOSIS — Z7982 Long term (current) use of aspirin: Secondary | ICD-10-CM

## 2021-09-23 DIAGNOSIS — O360130 Maternal care for anti-D [Rh] antibodies, third trimester, not applicable or unspecified: Secondary | ICD-10-CM

## 2021-09-23 LAB — CBC
HCT: 35.8 % — ABNORMAL LOW (ref 36.0–46.0)
Hemoglobin: 12.2 g/dL (ref 12.0–15.0)
MCH: 29.7 pg (ref 26.0–34.0)
MCHC: 34.1 g/dL (ref 30.0–36.0)
MCV: 87.1 fL (ref 80.0–100.0)
Platelets: 260 10*3/uL (ref 150–400)
RBC: 4.11 MIL/uL (ref 3.87–5.11)
RDW: 12.8 % (ref 11.5–15.5)
WBC: 12.1 10*3/uL — ABNORMAL HIGH (ref 4.0–10.5)
nRBC: 0 % (ref 0.0–0.2)

## 2021-09-23 LAB — TYPE AND SCREEN
ABO/RH(D): A POS
Antibody Screen: NEGATIVE

## 2021-09-23 LAB — RPR: RPR Ser Ql: NONREACTIVE

## 2021-09-23 MED ORDER — WITCH HAZEL-GLYCERIN EX PADS: 1.0000 "application " | MEDICATED_PAD | CUTANEOUS | Status: DC | PRN

## 2021-09-23 MED ORDER — PHENYLEPHRINE 40 MCG/ML (10ML) SYRINGE FOR IV PUSH (FOR BLOOD PRESSURE SUPPORT)
80.0000 ug | PREFILLED_SYRINGE | INTRAVENOUS | Status: DC | PRN
  Filled 2021-09-23: qty 10

## 2021-09-23 MED ORDER — COCONUT OIL OIL
1.0000 "application " | TOPICAL_OIL | Status: DC | PRN
  Administered 2021-09-24: 1 via TOPICAL
  Filled 2021-09-23: qty 120

## 2021-09-23 MED ORDER — DIPHENHYDRAMINE HCL 50 MG/ML IJ SOLN
12.5000 mg | INTRAMUSCULAR | Status: DC | PRN
Start: 2021-09-23 — End: 2021-09-24

## 2021-09-23 MED ORDER — MISOPROSTOL 200 MCG PO TABS
ORAL_TABLET | ORAL | Status: AC
  Filled 2021-09-23: qty 4

## 2021-09-23 MED ORDER — OXYCODONE-ACETAMINOPHEN 5-325 MG PO TABS: 2.0000 | ORAL_TABLET | ORAL | Status: DC | PRN

## 2021-09-23 MED ORDER — OXYTOCIN 10 UNIT/ML IJ SOLN
INTRAMUSCULAR | Status: AC
  Filled 2021-09-23: qty 2

## 2021-09-23 MED ORDER — ONDANSETRON HCL 4 MG PO TABS: 4.0000 mg | ORAL_TABLET | ORAL | Status: DC | PRN

## 2021-09-23 MED ORDER — METHYLERGONOVINE MALEATE 0.2 MG PO TABS: 0.2000 mg | ORAL_TABLET | ORAL | Status: DC | PRN

## 2021-09-23 MED ORDER — IBUPROFEN 600 MG PO TABS
600.0000 mg | ORAL_TABLET | Freq: Four times a day (QID) | ORAL | Status: DC
  Administered 2021-09-23 – 2021-09-24 (×4): 600 mg via ORAL
  Filled 2021-09-23 (×4): qty 1

## 2021-09-23 MED ORDER — LACTATED RINGERS IV SOLN: INTRAVENOUS | Status: DC

## 2021-09-23 MED ORDER — TERBUTALINE SULFATE 1 MG/ML IJ SOLN: 0.2500 mg | Freq: Once | INTRAMUSCULAR | Status: DC | PRN

## 2021-09-23 MED ORDER — DOCUSATE SODIUM 100 MG PO CAPS
100.0000 mg | ORAL_CAPSULE | Freq: Two times a day (BID) | ORAL | Status: DC
  Administered 2021-09-24: 100 mg via ORAL
  Filled 2021-09-23: qty 1

## 2021-09-23 MED ORDER — ONDANSETRON HCL 4 MG/2ML IJ SOLN: 4.0000 mg | Freq: Four times a day (QID) | INTRAMUSCULAR | Status: DC | PRN

## 2021-09-23 MED ORDER — AMMONIA AROMATIC IN INHA
RESPIRATORY_TRACT | Status: AC
  Filled 2021-09-23: qty 10

## 2021-09-23 MED ORDER — LACTATED RINGERS IV SOLN: 500.0000 mL | INTRAVENOUS | Status: DC | PRN

## 2021-09-23 MED ORDER — SOD CITRATE-CITRIC ACID 500-334 MG/5ML PO SOLN: 30.0000 mL | ORAL | Status: DC | PRN

## 2021-09-23 MED ORDER — EPHEDRINE 5 MG/ML INJ
10.0000 mg | INTRAVENOUS | Status: DC | PRN
  Filled 2021-09-23: qty 2

## 2021-09-23 MED ORDER — ACETAMINOPHEN 325 MG PO TABS: 650.0000 mg | ORAL_TABLET | ORAL | Status: DC | PRN

## 2021-09-23 MED ORDER — OXYCODONE-ACETAMINOPHEN 5-325 MG PO TABS: 1.0000 | ORAL_TABLET | ORAL | Status: DC | PRN

## 2021-09-23 MED ORDER — BUPIVACAINE HCL (PF) 0.25 % IJ SOLN
INTRAMUSCULAR | Status: DC | PRN
  Administered 2021-09-23: 2 mL via EPIDURAL
  Administered 2021-09-23: 4 mL via EPIDURAL

## 2021-09-23 MED ORDER — METHYLERGONOVINE MALEATE 0.2 MG/ML IJ SOLN
INTRAMUSCULAR | Status: AC
  Administered 2021-09-23: 0.2 mg via INTRAMUSCULAR
  Filled 2021-09-23: qty 1

## 2021-09-23 MED ORDER — FENTANYL-BUPIVACAINE-NACL 0.5-0.125-0.9 MG/250ML-% EP SOLN
EPIDURAL | Status: AC
  Filled 2021-09-23: qty 250

## 2021-09-23 MED ORDER — LIDOCAINE HCL (PF) 1 % IJ SOLN: 30.0000 mL | INTRAMUSCULAR | Status: DC | PRN

## 2021-09-23 MED ORDER — PRENATAL MULTIVITAMIN CH
1.0000 | ORAL_TABLET | Freq: Every day | ORAL | Status: DC
  Administered 2021-09-24: 1 via ORAL
  Filled 2021-09-23: qty 1

## 2021-09-23 MED ORDER — BUTORPHANOL TARTRATE 1 MG/ML IJ SOLN: 1.0000 mg | INTRAMUSCULAR | Status: DC | PRN

## 2021-09-23 MED ORDER — OXYTOCIN BOLUS FROM INFUSION
333.0000 mL | Freq: Once | INTRAVENOUS | Status: AC
Start: 2021-09-23 — End: 2021-09-23
  Administered 2021-09-23: 333 mL via INTRAVENOUS

## 2021-09-23 MED ORDER — LACTATED RINGERS IV SOLN: 500.0000 mL | Freq: Once | INTRAVENOUS | Status: DC

## 2021-09-23 MED ORDER — SIMETHICONE 80 MG PO CHEW: 80.0000 mg | CHEWABLE_TABLET | ORAL | Status: DC | PRN

## 2021-09-23 MED ORDER — LIDOCAINE-EPINEPHRINE (PF) 1.5 %-1:200000 IJ SOLN
INTRAMUSCULAR | Status: DC | PRN
  Administered 2021-09-23: 3 mL via PERINEURAL

## 2021-09-23 MED ORDER — LIDOCAINE HCL (PF) 1 % IJ SOLN
INTRAMUSCULAR | Status: DC | PRN
  Administered 2021-09-23: 3 mL

## 2021-09-23 MED ORDER — DIBUCAINE (PERIANAL) 1 % EX OINT: 1.0000 "application " | TOPICAL_OINTMENT | CUTANEOUS | Status: DC | PRN

## 2021-09-23 MED ORDER — OXYTOCIN-SODIUM CHLORIDE 30-0.9 UT/500ML-% IV SOLN
2.5000 [IU]/h | INTRAVENOUS | Status: DC
  Administered 2021-09-23: 2.5 [IU]/h via INTRAVENOUS
  Filled 2021-09-23 (×2): qty 500

## 2021-09-23 MED ORDER — LIDOCAINE HCL (PF) 1 % IJ SOLN
INTRAMUSCULAR | Status: AC
  Filled 2021-09-23: qty 30

## 2021-09-23 MED ORDER — ONDANSETRON HCL 4 MG/2ML IJ SOLN: 4.0000 mg | INTRAMUSCULAR | Status: DC | PRN

## 2021-09-23 MED ORDER — FENTANYL-BUPIVACAINE-NACL 0.5-0.125-0.9 MG/250ML-% EP SOLN
12.0000 mL/h | EPIDURAL | Status: DC | PRN
  Administered 2021-09-23: 12 mL/h via EPIDURAL

## 2021-09-23 MED ORDER — METHYLERGONOVINE MALEATE 0.2 MG/ML IJ SOLN: 0.2000 mg | INTRAMUSCULAR | Status: DC | PRN

## 2021-09-23 MED ORDER — BENZOCAINE-MENTHOL 20-0.5 % EX AERO: 1.0000 "application " | INHALATION_SPRAY | CUTANEOUS | Status: DC | PRN

## 2021-09-23 MED ORDER — FERROUS SULFATE 325 (65 FE) MG PO TABS
325.0000 mg | ORAL_TABLET | Freq: Every day | ORAL | Status: DC
  Administered 2021-09-24: 325 mg via ORAL
  Filled 2021-09-23: qty 1

## 2021-09-23 MED ORDER — OXYTOCIN-SODIUM CHLORIDE 30-0.9 UT/500ML-% IV SOLN
1.0000 m[IU]/min | INTRAVENOUS | Status: DC
  Administered 2021-09-23: 4 m[IU]/min via INTRAVENOUS

## 2021-09-23 MED ORDER — SENNOSIDES-DOCUSATE SODIUM 8.6-50 MG PO TABS
2.0000 | ORAL_TABLET | ORAL | Status: DC
  Administered 2021-09-23: 2 via ORAL
  Filled 2021-09-23: qty 2

## 2021-09-23 NOTE — Anesthesia Preprocedure Evaluation (Signed)
Anesthesia Evaluation  Patient identified by MRN, date of birth, ID band Patient awake    Reviewed: Allergy & Precautions, H&P , NPO status , Patient's Chart, lab work & pertinent test results  Airway Mallampati: II       Dental no notable dental hx.    Pulmonary neg pulmonary ROS,           Cardiovascular negative cardio ROS       Neuro/Psych negative neurological ROS     GI/Hepatic Neg liver ROS, GERD  ,  Endo/Other  negative endocrine ROS  Renal/GU negative Renal ROS  negative genitourinary   Musculoskeletal   Abdominal   Peds  Hematology negative hematology ROS (+)   Anesthesia Other Findings   Reproductive/Obstetrics (+) Pregnancy                             Anesthesia Physical Anesthesia Plan  ASA: 2  Anesthesia Plan: Epidural   Post-op Pain Management:    Induction:   PONV Risk Score and Plan:   Airway Management Planned:   Additional Equipment:   Intra-op Plan:   Post-operative Plan:   Informed Consent: I have reviewed the patients History and Physical, chart, labs and discussed the procedure including the risks, benefits and alternatives for the proposed anesthesia with the patient or authorized representative who has indicated his/her understanding and acceptance.       Plan Discussed with: Anesthesiologist and CRNA  Anesthesia Plan Comments:         Anesthesia Quick Evaluation

## 2021-09-23 NOTE — Anesthesia Procedure Notes (Signed)
Epidural Patient location during procedure: OB Start time: 09/23/2021 1:50 PM End time: 09/23/2021 2:04 PM  Staffing Anesthesiologist: Alver Fisher, MD Resident/CRNA: Elmarie Mainland, CRNA Performed: resident/CRNA   Preanesthetic Checklist Completed: patient identified, IV checked, site marked, risks and benefits discussed, surgical consent, monitors and equipment checked, pre-op evaluation and timeout performed  Epidural Patient position: sitting Prep: ChloraPrep Patient monitoring: heart rate, continuous pulse ox and blood pressure Approach: midline Location: L3-L4 Injection technique: LOR saline  Needle:  Needle type: Tuohy  Needle gauge: 17 G Needle length: 9 cm and 9 Needle insertion depth: 9 cm Catheter type: closed end flexible Catheter size: 19 Gauge Catheter at skin depth: 14 cm Test dose: negative and 1.5% lidocaine with Epi 1:200 K  Assessment Sensory level: T10 Events: blood not aspirated, injection not painful, no injection resistance, no paresthesia and negative IV test  Additional Notes 2 attempt Pt. Evaluated and documentation done after procedure finished. Patient identified. Risks/Benefits/Options discussed with patient including but not limited to bleeding, infection, nerve damage, paralysis, failed block, incomplete pain control, headache, blood pressure changes, nausea, vomiting, reactions to medication both or allergic, itching and postpartum back pain. Confirmed with bedside nurse the patient's most recent platelet count. Confirmed with patient that they are not currently taking any anticoagulation, have any bleeding history or any family history of bleeding disorders. Patient expressed understanding and wished to proceed. All questions were answered. Sterile technique was used throughout the entire procedure. Please see nursing notes for vital signs. Test dose was given through epidural catheter and negative prior to continuing to dose epidural or  start infusion. Warning signs of high block given to the patient including shortness of breath, tingling/numbness in hands, complete motor block, or any concerning symptoms with instructions to call for help. Patient was given instructions on fall risk and not to get out of bed. All questions and concerns addressed with instructions to call with any issues or inadequate analgesia.    Patient tolerated the insertion well without immediate complications.Reason for block:procedure for pain

## 2021-09-23 NOTE — Progress Notes (Signed)
LABOR NOTE   Valerie Garner [address] 25 y.o.GP@ at [redacted]w[redacted]d  SUBJECTIVE:  Mild to moderate back discomfort that radiates to her front. She is coping well ambulating and using birthing ball.  Analgesia: Labor support without medications  OBJECTIVE:  BP 123/76 (BP Location: Right Arm)   Pulse 80   Temp 97.9 F (36.6 C) (Oral)   Resp 18   Ht 5\' 4"  (1.626 m)   Wt 104.8 kg   LMP 12/14/2020 (Approximate)   BMI 39.65 kg/m    She has shown cervical change. CERVIX: 4.5:  80%:   -1:   mid position:   soft SVE:   Dilation: 4 Effacement (%): 70 Station: -1 Exam by:: [email or web address], RN CONTRACTIONS: regular, every 2-3 minutes FHR: Fetal heart tracing reviewed. Baseline: 125 bpm, Variability: Good {> 6 bpm), Accelerations: Reactive, and Decelerations: Absent Category I    Labs: Lab Results  Component Value Date   WBC 12.1 (H) 09/23/2021   HGB 12.2 09/23/2021   HCT 35.8 (L) 09/23/2021   MCV 87.1 09/23/2021   PLT 260 09/23/2021    ASSESSMENT: 1) Labor curve reviewed.       Progress: AROM and Early latent labor.     Membranes: ruptured, clear fluid             Active Problems:   Labor and delivery, indication for care Obesity complicating pregnancy  PLAN: continue present management and IV Pitocin induction.  09/25/2021, CNM  09/23/2021 11:48 AM

## 2021-09-23 NOTE — Progress Notes (Signed)
LABOR NOTE   Valerie Garner [address] 25 y.o.GP@ at [redacted]w[redacted]d  SUBJECTIVE:  Feeling better with epidural.  Analgesia: Epidural  OBJECTIVE:  BP 123/76 (BP Location: Right Arm)   Pulse 80   Temp 98.1 F (36.7 C) (Oral)   Resp 18   Ht 5\' 4"  (1.626 m)   Wt 104.8 kg   LMP 12/14/2020 (Approximate)   BMI 39.65 kg/m  No intake/output data recorded.  She has shown cervical change. CERVIX: 6 cm:  80%:   -1:   mid position:   soft SVE:   Dilation: 4 Effacement (%): 70 Station: -1 Exam by:: [email or web address] CNM CONTRACTIONS: regular, every 2-3 minutes FHR: Fetal heart tracing reviewed. Baseline: 155 bpm, Variability: Good {> 6 bpm), Accelerations: Reactive, and Decelerations: Early Category I    Labs: Lab Results  Component Value Date   WBC 12.1 (H) 09/23/2021   HGB 12.2 09/23/2021   HCT 35.8 (L) 09/23/2021   MCV 87.1 09/23/2021   PLT 260 09/23/2021    ASSESSMENT: 1) Labor curve reviewed.       Progress: Active phase labor.     Membranes: ruptured, clear fluid           Active Problems:   Labor and delivery, indication for care Obesity in pregnancy   PLAN: continue present management   09/25/2021, CNM  09/23/2021 2:20 PM

## 2021-09-23 NOTE — Progress Notes (Addendum)
18 -RN turned pt to right exaggerated sims on peanut ball. Pt comfortable, stating she felt no pressure. Pt to rest-epidural infusing.  1537-Support person called out reporting pt beginning to feel pressure. RN to bedside to assess. Pt feeling intermittent pressure. FHT 115. RN explained to patient labor progression,etc. Pt declined cervical exam at this time- wishing for pressure to get more constant.  1542 FHR 112. 1543 Monica monitors tracing maternal HR, RN at bedside. RN moved electrode to obtain FHT. No FHR was detected. Pt stated she was feeling "intermittent pressure that isn't to bad".  1545 RN lifted back blankets to adjust monitor and figure out why FHR not detected- fetal head noted. Second RN hit emergency button.  1546 RN delivered rest of fetal body. CNM called.  60 Third RN delivered placenta. Pitocin bolus started.  1553 CNM to bedside.

## 2021-09-23 NOTE — H&P (Signed)
History and Physical   HPI  Valerie Garner is a 25 y.o. G2P1001 at [redacted]w[redacted]d Estimated Date of Delivery: 09/30/21 who is being admitted for induction of labor elevated BMI in pregnancy    OB History  OB History  Gravida Para Term Preterm AB Living  2 1 1  0 0 1  SAB IAB Ectopic Multiple Live Births  0 0 0 0 1    # Outcome Date GA Lbr Len/2nd Weight Sex Delivery Anes PTL Lv  2 Current           1 Term 08/22/19 [redacted]w[redacted]d 10:35 / 00:48 2950 g M Vag-Spont EPI N LIV     Name: M      Apgar1: 8  Apgar5: 9    PROBLEM LIST  Pregnancy complications or risks: Patient Active Problem List   Diagnosis Date Noted   Labor and delivery, indication for care 09/23/2021   Type A blood, Rh positive 03/03/2021   Obesity in pregnancy 08/08/2019    Prenatal labs and studies: ABO, Rh: --/--/A POS (10/20 03-26-1973) Antibody: NEG (10/20 03-26-1973) Rubella: 4.61 (03/25 1407) RPR: Non Reactive (08/02 0936)  HBsAg: Negative (03/25 1407)  HIV: Non Reactive (03/25 1407)  12-01-1999-- (09/30 1436)   Past Medical History:  Diagnosis Date   Postpartum depression associated with first pregnancy      Past Surgical History:  Procedure Laterality Date   adenoidectomy     TONSILLECTOMY Bilateral    WISDOM TOOTH EXTRACTION       Medications    Current Discharge Medication List     CONTINUE these medications which have NOT CHANGED   Details  aspirin EC 81 MG tablet Take 1 tablet (81 mg total) by mouth daily. Swallow whole. Qty: 30 tablet, Refills: 11    Prenatal Vit-Fe Fumarate-FA (PRENATAL VITAMINS PO) Take by mouth.    acetaminophen (TYLENOL) 500 MG tablet Take 500 mg by mouth every 6 (six) hours as needed.    calcium carbonate (TUMS EX) 750 MG chewable tablet Chew 1 tablet by mouth daily. As needed         Allergies  Patient has no known allergies.  Review of Systems  Constitutional: negative Eyes: negative Ears, nose, mouth, throat, and face:  negative Respiratory: negative Cardiovascular: negative Gastrointestinal: negative Genitourinary:negative Integument/breast: negative Hematologic/lymphatic: negative Musculoskeletal:negative Neurological: negative Behavioral/Psych: negative Endocrine: negative Allergic/Immunologic: negative  Physical Exam  BP 134/80   Pulse 97   Temp 98 F (36.7 C) (Oral)   Resp 16   Ht 5\' 4"  (1.626 m)   Wt 104.8 kg   LMP 12/14/2020 (Approximate)   BMI 39.65 kg/m   Lungs:  CTA B Cardio: RRR without M/R/G Abd: Soft, gravid, NT Presentation: cephalic EXT: No C/C/ 1+ Edema DTRs: 2+ B CERVIX: Dilation: 3 Effacement (%): 60, 70 Station: -1 Presentation: Vertex Exam by:: 02/11/2021, RN  See Prenatal records for more detailed PE.     FHR:  Baseline: 135 bpm, Variability: Good {> 6 bpm), Accelerations: Reactive, and Decelerations: Absent  Toco: Uterine Contractions: irritability  Test Results  Results for orders placed or performed during the hospital encounter of 09/23/21 (from the past 24 hour(s))  CBC     Status: Abnormal   Collection Time: 09/23/21  6:12 AM  Result Value Ref Range   WBC 12.1 (H) 4.0 - 10.5 K/uL   RBC 4.11 3.87 - 5.11 MIL/uL   Hemoglobin 12.2 12.0 - 15.0 g/dL   HCT 09/25/21 (L) 09/25/21 - 82.9 %  MCV 87.1 80.0 - 100.0 fL   MCH 29.7 26.0 - 34.0 pg   MCHC 34.1 30.0 - 36.0 g/dL   RDW 51.7 00.1 - 74.9 %   Platelets 260 150 - 400 K/uL   nRBC 0.0 0.0 - 0.2 %  Type and screen     Status: None   Collection Time: 09/23/21  6:14 AM  Result Value Ref Range   ABO/RH(D) A POS    Antibody Screen NEG    Sample Expiration      09/26/2021,2359 Performed at [facility], [address] [place], [place] [address]    Group B Strep negative  Assessment   G2P1001 at [redacted]w[redacted]d Estimated Date of Delivery: 09/30/21  The fetus is reassuring.   Patient Active Problem List   Diagnosis Date Noted   Labor and delivery, indication for care 09/23/2021   Type A blood,  Rh positive 03/03/2021   Obesity in pregnancy 08/08/2019    Plan  1. Admit to L&D :   2. EFM:-- Category 1 3. Stadol or Epidural if desired.   4. Admission labs completed 5. Dr. Logan Bores aware plan of care and in agreement 6.Anticipate NSVD  Valerie Garner, CNM  09/23/2021 7:46 AM

## 2021-09-23 NOTE — Lactation Note (Signed)
This note was copied from a baby's chart. Lactation Consultation Note  Patient Name: Valerie Garner [id number]'[id number] Date: 09/23/2021 Reason for consult: L&D Initial assessment;Term Age:24 hours  Initial lactation visit in LDR1. Mom is P2, SVD <1hr. Mom desires exclusive breastfeeding, reports difficult breastfeeding journey with her first, now 47yrs old. First child had little interest in eating- from mom or bottle, had to be wakened to eat at times, the stress was overwhelming per mom. Mom has compressible tissue, everted nipples.  Before attempting to feed we discussed newborn feeding patterns in first 24 hours, normalcy of a few good feedings and periods without eating, impact that spitty/gagginess can have on the baby's desire to eat, benefits of skin to skin over the next 24 hours, and options for hand expression/spoon feeding for reassurance.   LC repositioned mom into more upright position, baby adjusted slightly for cradle hold at L breast. LC talked through positioning of baby and sandwiching of the breast tissue, rubbing of nipple nose to chin, and bringing baby in tight when she gives wide open mouth. Baby grasped the breast easily, immediately had a rhythmic sucking pattern, occasional swallows noted. Baby remained active for 17 minutes, no feeding concerns noted. Baby was moved to chest, skin to skin where she settled quickly.  Encouraged 8-12 attempts over the next 24 hours, reassurance if baby doesn't show interest at every feeding that is ok, taught hand expression on opposite breast where colostrum was easily expressed, discussed options of hand expression/spoon feeding if needed, and output expectations of baby.  Maternal Data Has patient been taught Hand Expression?: Yes Does the patient have breastfeeding experience prior to this delivery?: Yes How long did the patient breastfeed?: 1 month  Feeding Mother's Current Feeding Choice: Breast Milk  LATCH Score Latch: Grasps  breast easily, tongue down, lips flanged, rhythmical sucking.  Audible Swallowing: A few with stimulation  Type of Nipple: Everted at rest and after stimulation  Comfort (Breast/Nipple): Soft / non-tender  Hold (Positioning): Assistance needed to correctly position infant at breast and maintain latch.  LATCH Score: 8   Lactation Tools Discussed/Used    Interventions Interventions: Breast feeding basics reviewed;Assisted with latch;Hand express;Skin to skin;Breast compression;Adjust position;Support pillows;Education  Discharge    Consult Status Consult Status: Follow-up Date: 09/24/21 Follow-up type: In-patient    Danford Bad 09/23/2021, 5:03 PM

## 2021-09-24 ENCOUNTER — Encounter: Payer: 59 | Admitting: Certified Nurse Midwife

## 2021-09-24 ENCOUNTER — Other Ambulatory Visit: Payer: 59

## 2021-09-24 LAB — CBC
HCT: 32.5 % — ABNORMAL LOW (ref 36.0–46.0)
Hemoglobin: 11.2 g/dL — ABNORMAL LOW (ref 12.0–15.0)
MCH: 30.2 pg (ref 26.0–34.0)
MCHC: 34.5 g/dL (ref 30.0–36.0)
MCV: 87.6 fL (ref 80.0–100.0)
Platelets: 252 10*3/uL (ref 150–400)
RBC: 3.71 MIL/uL — ABNORMAL LOW (ref 3.87–5.11)
RDW: 13 % (ref 11.5–15.5)
WBC: 12.8 10*3/uL — ABNORMAL HIGH (ref 4.0–10.5)
nRBC: 0 % (ref 0.0–0.2)

## 2021-09-24 MED ORDER — IBUPROFEN 600 MG PO TABS
600.0000 mg | ORAL_TABLET | Freq: Four times a day (QID) | ORAL | 0 refills | Status: DC
Start: 2021-09-24 — End: 2022-03-04

## 2021-09-24 NOTE — Progress Notes (Signed)
Pt discharged with infant.  Discharge instructions, prescriptions and follow up appointment given to and reviewed with pt. Pt verbalized understanding. Escorted out by auxillary. 

## 2021-09-24 NOTE — Lactation Note (Signed)
This note was copied from a baby's chart. Lactation Consultation Note  Patient Name: Valerie Garner [id number] Date: 09/24/2021 Reason for consult: Follow-up assessment;Mother's request;Term Age:24 hours  Maternal Data Does the patient have breastfeeding experience prior to this delivery?: Yes How long did the patient breastfeed?: 1 mth  Feeding Mother's Current Feeding Choice: Breast Milk Called to room as baby just spit up yellow colsotrum and mucous, baby pink, no cyanosis, baby had been gaggy per mom   LATCH Score                    Lactation Tools Discussed/Used    Interventions  LC name and no updated on white board and parents instructed to call for assistance or questions/concerns  Discharge Pump: Personal WIC Program: No  Consult Status Consult Status: Follow-up Date: 09/24/21 Follow-up type: In-patient    Dyann Kief 09/24/2021, 11:13 AM

## 2021-09-24 NOTE — Anesthesia Postprocedure Evaluation (Signed)
Anesthesia Post Note  Patient: Valerie Garner [facility]  Procedure(s) Performed: AN AD HOC LABOR EPIDURAL  Patient location during evaluation: Mother Baby Anesthesia Type: Epidural Level of consciousness: awake and alert and oriented Pain management: pain level controlled Vital Signs Assessment: post-procedure vital signs reviewed and stable Respiratory status: respiratory function stable Cardiovascular status: stable Postop Assessment: no headache, no backache, patient able to bend at knees, no apparent nausea or vomiting, able to ambulate and adequate PO intake Anesthetic complications: no   No notable events documented.   Last Vitals:  Vitals:   09/24/21 0351 09/24/21 0809  BP: 123/74 119/82  Pulse: 90 88  Resp: 18 20  Temp: 36.5 C 36.5 C  SpO2: 100% 100%    Last Pain:  Vitals:   09/24/21 0809  TempSrc: Oral  PainSc:                  Valerie Garner

## 2021-09-24 NOTE — Discharge Summary (Signed)
Patient Name: Valerie Garner [facility] DOB: [birth year] MRN: [id number]                            Discharge Summary  Date of Admission: 09/23/2021 Date of Discharge: 09/24/2021 Delivering Provider: Daine Floras C   Admitting Diagnosis: Labor and delivery, indication for care [O75.9] at [redacted]w[redacted]d Secondary diagnosis:  Active Problems:   Labor and delivery, indication for care Obesity in pregnancy  Mode of Delivery: normal spontaneous vaginal delivery              Discharge diagnosis: Term Pregnancy Delivered      Intrapartum Procedures: epidural   Post partum procedures:  none  Complications: none                     Discharge Day SOAP Note:  Progress Note - Vaginal Delivery  Valerie Garner is a 25 y.o. [phone] now PP day 1 s/p Vaginal, Spontaneous . Delivery was uncomplicated  Subjective  The patient has the following complaints: has no unusual complaints  Pain is controlled with current medications.   Patient is urinating without difficulty.  She is ambulating well.     Objective  Vital signs: BP 119/82 (BP Location: Left Arm)   Pulse 88   Temp 97.7 F (36.5 C) (Oral)   Resp 20   Ht 5\' 4"  (1.626 m)   Wt 104.8 kg   LMP 12/14/2020 (Approximate)   SpO2 100%   Breastfeeding Unknown   BMI 39.65 kg/m   Physical Exam: Gen: NAD Fundus Fundal Tone: Firm  Lochia Amount: Small        Data Review Labs: Lab Results  Component Value Date   WBC 12.8 (H) 09/24/2021   HGB 11.2 (L) 09/24/2021   HCT 32.5 (L) 09/24/2021   MCV 87.6 09/24/2021   PLT 252 09/24/2021   CBC Latest Ref Rng & Units 09/24/2021 09/23/2021 07/06/2021  WBC 4.0 - 10.5 K/uL 12.8(H) 12.1(H) 10.4  Hemoglobin 12.0 - 15.0 g/dL 11.2(L) 12.2 11.4  Hematocrit 36.0 - 46.0 % 32.5(L) 35.8(L) 34.6  Platelets 150 - 400 K/uL 252 260 340   A POS  Edinburgh Score: Edinburgh Postnatal Depression Scale Screening Tool 08/23/2019  I have been able to laugh and see the funny side of things. 0   I have looked forward with enjoyment to things. 0  I have blamed myself unnecessarily when things went wrong. 0  I have been anxious or worried for no good reason. 0  I have felt scared or panicky for no good reason. 0  Things have been getting on top of me. 1  I have been so unhappy that I have had difficulty sleeping. 0  I have felt sad or miserable. 0  I have been so unhappy that I have been crying. 1  The thought of harming myself has occurred to me. 0  Edinburgh Postnatal Depression Scale Total 2    Assessment/Plan  Active Problems:   Labor and delivery, indication for care    Plan for discharge today.  Discharge Instructions: Per After Visit Summary. Activity: Advance as tolerated. Pelvic rest for 6 weeks.  Also refer to After Visit Summary Diet: Regular Medications: Allergies as of 09/24/2021   No Known Allergies      Medication List     STOP taking these medications    aspirin EC 81 MG tablet  TAKE these medications    acetaminophen 500 MG tablet Commonly known as: TYLENOL Take 500 mg by mouth every 6 (six) hours as needed.   calcium carbonate 750 MG chewable tablet Commonly known as: TUMS EX Chew 1 tablet by mouth daily. As needed   ibuprofen 600 MG tablet Commonly known as: ADVIL Take 1 tablet (600 mg total) by mouth every 6 (six) hours.   PRENATAL VITAMINS PO Take by mouth.       Outpatient follow up: 2 week video visit, 6 wk postpartum visit  Postpartum contraception: Natural Family Planning  Discharged Condition: good  Discharged to: home  Newborn Data: Disposition:home with mother  Apgars: APGAR (1 MIN): 8   APGAR (5 MINS): 9   APGAR (10 MINS):    Baby Feeding: Breast    [name], CNM . 09/24/2021 8:12 AM

## 2021-09-24 NOTE — Final Progress Note (Signed)
Discharge Day SOAP Note:  Progress Note - Vaginal Delivery  Valerie Garner Valerie Garner is a 25 y.o. [phone] now PP day 1 s/p Vaginal, Spontaneous . Delivery was uncomplicated  Subjective  The patient has the following complaints: has no unusual complaints  Pain is controlled with current medications.   Patient is urinating without difficulty.  She is ambulating well.     Objective  Vital signs: BP 119/82 (BP Location: Left Arm)   Pulse 88   Temp 97.7 F (36.5 C) (Oral)   Resp 20   Ht 5\' 4"  (1.626 m)   Wt 104.8 kg   LMP 12/14/2020 (Approximate)   SpO2 100%   Breastfeeding Unknown   BMI 39.65 kg/m   Physical Exam: Gen: NAD Fundus Fundal Tone: Firm  Lochia Amount: Small        Data Review Labs: Lab Results  Component Value Date   WBC 12.8 (H) 09/24/2021   HGB 11.2 (L) 09/24/2021   HCT 32.5 (L) 09/24/2021   MCV 87.6 09/24/2021   PLT 252 09/24/2021   CBC Latest Ref Rng & Units 09/24/2021 09/23/2021 07/06/2021  WBC 4.0 - 10.5 K/uL 12.8(H) 12.1(H) 10.4  Hemoglobin 12.0 - 15.0 g/dL 11.2(L) 12.2 11.4  Hematocrit 36.0 - 46.0 % 32.5(L) 35.8(L) 34.6  Platelets 150 - 400 K/uL 252 260 340   A POS  Edinburgh Score: Edinburgh Postnatal Depression Scale Screening Tool 08/23/2019  I have been able to laugh and see the funny side of things. 0  I have looked forward with enjoyment to things. 0  I have blamed myself unnecessarily when things went wrong. 0  I have been anxious or worried for no good reason. 0  I have felt scared or panicky for no good reason. 0  Things have been getting on top of me. 1  I have been so unhappy that I have had difficulty sleeping. 0  I have felt sad or miserable. 0  I have been so unhappy that I have been crying. 1  The thought of harming myself has occurred to me. 0  Edinburgh Postnatal Depression Scale Total 2    Assessment/Plan  Active Problems:   Labor and delivery, indication for care    Plan for discharge today.  Discharge  Instructions: Per After Visit Summary. Activity: Advance as tolerated. Pelvic rest for 6 weeks.  Also refer to After Visit Summary Diet: Regular Medications: Allergies as of 09/24/2021   No Known Allergies      Medication List     STOP taking these medications    aspirin EC 81 MG tablet       TAKE these medications    acetaminophen 500 MG tablet Commonly known as: TYLENOL Take 500 mg by mouth every 6 (six) hours as needed.   calcium carbonate 750 MG chewable tablet Commonly known as: TUMS EX Chew 1 tablet by mouth daily. As needed   ibuprofen 600 MG tablet Commonly known as: ADVIL Take 1 tablet (600 mg total) by mouth every 6 (six) hours.   PRENATAL VITAMINS PO Take by mouth.       Outpatient follow up: 2 week video visit, 6 wk postpartum visit  Postpartum contraception: Natural Family Planning  Discharged Condition: good  Discharged to: home  Newborn Data: Disposition:home with mother  Apgars: APGAR (1 MIN): 8   APGAR (5 MINS): 9   APGAR (10 MINS):    Baby Feeding: Breast    09/26/2021, CNM . 09/24/2021 8:12 AM

## 2021-09-24 NOTE — Lactation Note (Signed)
This note was copied from a baby's chart. Lactation Consultation Note  Patient Name: Valerie Garner [id number] Date: 09/24/2021 Reason for consult: Follow-up assessment;Mother's request Age:24 hours  Maternal Data Has patient been taught Hand Expression?: Yes Does the patient have breastfeeding experience prior to this delivery?: Yes How long did the patient breastfeed?: 1 mth  Feeding Mother's Current Feeding Choice: Breast Milk Observed latch per mom request, no pain with latch, mom shown how to support breast while baby latching and feeding and how to get deep latch, baby eager to latch, gets sleepy at breast and needs stimulation to continue nursing, few swallows noted, nursed 10 min on right in cradle hold, mom burped baby and latch to left breast in cradle hold, mom found that baby latches easier to left breast, continues to nurse when room left   LATCH Score Latch: Grasps breast easily, tongue down, lips flanged, rhythmical sucking.  Audible Swallowing: A few with stimulation  Type of Nipple: Everted at rest and after stimulation  Comfort (Breast/Nipple): Soft / non-tender  Hold (Positioning): Assistance needed to correctly position infant at breast and maintain latch.  LATCH Score: 8   Lactation Tools Discussed/Used    Interventions Interventions: Breast feeding basics reviewed;Assisted with latch;Hand express;Adjust position;Support pillows;Education  Discharge Pump: Personal WIC Program: No  Consult Status Consult Status: PRN Date: 09/24/21 Follow-up type: In-patient    Dyann Kief 09/24/2021, 11:46 AM

## 2021-09-29 ENCOUNTER — Encounter: Payer: 59 | Admitting: Certified Nurse Midwife

## 2021-09-29 ENCOUNTER — Other Ambulatory Visit: Payer: 59

## 2021-10-08 ENCOUNTER — Telehealth (INDEPENDENT_AMBULATORY_CARE_PROVIDER_SITE_OTHER): Payer: 59 | Admitting: Certified Nurse Midwife

## 2021-10-08 DIAGNOSIS — Z1332 Encounter for screening for maternal depression: Secondary | ICD-10-CM

## 2021-10-08 DIAGNOSIS — Z1331 Encounter for screening for depression: Secondary | ICD-10-CM

## 2021-10-08 NOTE — Progress Notes (Signed)
Virtual Visit via video Note  I connected with Valerie Garner on 10/08/21 at 11:30 AM EDT by telephone and verified that I am speaking with the correct person using two identifiers.  Location: Patient: at home Provider: at the office   I discussed the limitations, risks, security and privacy concerns of performing an evaluation and management service by telephone and the availability of in person appointments. I also discussed with the patient that there may be a patient responsible charge related to this service. The patient expressed understanding and agreed to proceed.   History of Present Illness: G2P2  2 weeks post SVD on 09/23/21.   Observations/Objective: Doing well, nursing and bottle feeding due to poor infant weight gain. Bleeding has slowed. Pain is minimal  and her mood is good.   Flowsheet Row Video Visit from 10/08/2021 in [facility]  PHQ-9 Total Score 1      GAD 7 : Generalized Anxiety Score 10/08/2021 02/26/2021  Nervous, Anxious, on Edge 0 0  Control/stop worrying 0 0  Worry too much - different things 0 0  Trouble relaxing 0 0  Restless 0 0  Easily annoyed or irritable 0 0  Afraid - awful might happen 0 0  Total GAD 7 Score 0 0      Assessment and Plan:   Negative today for postpartum depression and anxiety  Follow Up Instructions: As scheduled in 4 ws in the office for PPV.    I discussed the assessment and treatment plan with the patient. The patient was provided an opportunity to ask questions and all were answered. The patient agreed with the plan and demonstrated an understanding of the instructions.   The patient was advised to call back or seek an in-person evaluation if the symptoms worsen or if the condition fails to improve as anticipated.  I provided 6 minutes of video-face-to-face time during this encounter.   [name], CNM

## 2021-10-11 DIAGNOSIS — R102 Pelvic and perineal pain: Secondary | ICD-10-CM | POA: Diagnosis not present

## 2021-11-03 ENCOUNTER — Encounter: Payer: Self-pay | Admitting: Certified Nurse Midwife

## 2021-11-03 ENCOUNTER — Other Ambulatory Visit: Payer: Self-pay

## 2021-11-03 ENCOUNTER — Ambulatory Visit (INDEPENDENT_AMBULATORY_CARE_PROVIDER_SITE_OTHER): Payer: 59 | Admitting: Certified Nurse Midwife

## 2021-11-03 NOTE — Patient Instructions (Addendum)
Kegel Exercises ?Kegel exercises can help strengthen your pelvic floor muscles. The pelvic floor is a group of muscles that support your rectum, small intestine, and bladder. In females, pelvic floor muscles also help support the uterus. These muscles help you control the flow of urine and stool (feces). ?Kegel exercises are painless and simple. They do not require any equipment. Your provider may suggest Kegel exercises to: ?Improve bladder and bowel control. ?Improve sexual response. ?Improve weak pelvic floor muscles after surgery to remove the uterus (hysterectomy) or after pregnancy, in females. ?Improve weak pelvic floor muscles after prostate gland removal or surgery, in males. ?Kegel exercises involve squeezing your pelvic floor muscles. These are the same muscles you squeeze when you try to stop the flow of urine or keep from passing gas. The exercises can be done while sitting, standing, or lying down, but it is best to vary your position. ?Ask your health care provider which exercises are safe for you. Do exercises exactly as told by your health care provider and adjust them as directed. Do not begin these exercises until told by your health care provider. ?Exercises ?How to do Kegel exercises: ?Squeeze your pelvic floor muscles tight. You should feel a tight lift in your rectal area. If you are a female, you should also feel a tightness in your vaginal area. Keep your stomach, buttocks, and legs relaxed. ?Hold the muscles tight for up to 10 seconds. ?Breathe normally. ?Relax your muscles for up to 10 seconds. ?Repeat as told by your health care provider. ?Repeat this exercise daily as told by your health care provider. Continue to do this exercise for at least 4-6 weeks, or for as long as told by your health care provider. ?You may be referred to a physical therapist who can help you learn more about how to do Kegel exercises. ?Depending on your condition, your health care provider may  recommend: ?Varying how long you squeeze your muscles. ?Doing several sets of exercises every day. ?Doing exercises for several weeks. ?Making Kegel exercises a part of your regular exercise routine. ?This information is not intended to replace advice given to you by your health care provider. Make sure you discuss any questions you have with your health care provider. ?Document Revised: 04/01/2021 Document Reviewed: 04/01/2021 ?Elsevier Patient Education ? 2022 Elsevier Inc. ? ? ?Preventive Care 21-39 Years Old, Female ?Preventive care refers to lifestyle choices and visits with your health care provider that can promote health and wellness. Preventive care visits are also called wellness exams. ?What can I expect for my preventive care visit? ?Counseling ?During your preventive care visit, your health care provider may ask about your: ?Medical history, including: ?Past medical problems. ?Family medical history. ?Pregnancy history. ?Current health, including: ?Menstrual cycle. ?Method of birth control. ?Emotional well-being. ?Home life and relationship well-being. ?Sexual activity and sexual health. ?Lifestyle, including: ?Alcohol, nicotine or tobacco, and drug use. ?Access to firearms. ?Diet, exercise, and sleep habits. ?Work and work environment. ?Sunscreen use. ?Safety issues such as seatbelt and bike helmet use. ?Physical exam ?Your health care provider may check your: ?Height and weight. These may be used to calculate your BMI (body mass index). BMI is a measurement that tells if you are at a healthy weight. ?Waist circumference. This measures the distance around your waistline. This measurement also tells if you are at a healthy weight and may help predict your risk of certain diseases, such as type 2 diabetes and high blood pressure. ?Heart rate and blood pressure. ?Body temperature. ?  Skin for abnormal spots. ?What immunizations do I need? ?Vaccines are usually given at various ages, according to a schedule.  Your health care provider will recommend vaccines for you based on your age, medical history, and lifestyle or other factors, such as travel or where you work. ?What tests do I need? ?Screening ?Your health care provider may recommend screening tests for certain conditions. This may include: ?Pelvic exam and Pap test. ?Lipid and cholesterol levels. ?Diabetes screening. This is done by checking your blood sugar (glucose) after you have not eaten for a while (fasting). ?Hepatitis B test. ?Hepatitis C test. ?HIV (human immunodeficiency virus) test. ?STI (sexually transmitted infection) testing, if you are at risk. ?BRCA-related cancer screening. This may be done if you have a family history of breast, ovarian, tubal, or peritoneal cancers. ?Talk with your health care provider about your test results, treatment options, and if necessary, the need for more tests. ?Follow these instructions at home: ?Eating and drinking ? ?Eat a healthy diet that includes fresh fruits and vegetables, whole grains, lean protein, and low-fat dairy products. ?Take vitamin and mineral supplements as recommended by your health care provider. ?Do not drink alcohol if: ?Your health care provider tells you not to drink. ?You are pregnant, may be pregnant, or are planning to become pregnant. ?If you drink alcohol: ?Limit how much you have to 0-1 drink a day. ?Know how much alcohol is in your drink. In the [place] one drink equals one 12 oz bottle of beer (355 mL), one 5 oz glass of wine (148 mL), or one 1? oz glass of hard liquor (44 mL). ?Lifestyle ?Brush your teeth every morning and night with fluoride toothpaste. Floss one time each day. ?Exercise for at least 30 minutes 5 or more days each week. ?Do not use any products that contain nicotine or tobacco. These products include cigarettes, chewing tobacco, and vaping devices, such as e-cigarettes. If you need help quitting, ask your health care provider. ?Do not use drugs. ?If you are sexually  active, practice safe sex. Use a condom or other form of protection to prevent STIs. ?If you do not wish to become pregnant, use a form of birth control. If you plan to become pregnant, see your health care provider for a prepregnancy visit. ?Find healthy ways to manage stress, such as: ?Meditation, yoga, or listening to music. ?Journaling. ?Talking to a trusted person. ?Spending time with friends and family. ?Minimize exposure to UV radiation to reduce your risk of skin cancer. ?Safety ?Always wear your seat belt while driving or riding in a vehicle. ?Do not drive: ?If you have been drinking alcohol. Do not ride with someone who has been drinking. ?If you have been using any mind-altering substances or drugs. ?While texting. ?When you are tired or distracted. ?Wear a helmet and other protective equipment during sports activities. ?If you have firearms in your house, make sure you follow all gun safety procedures. ?Seek help if you have been physically or sexually abused. ?What's next? ?Go to your health care provider once a year for an annual wellness visit. ?Ask your health care provider how often you should have your eyes and teeth checked. ?Stay up to date on all vaccines. ?This information is not intended to replace advice given to you by your health care provider. Make sure you discuss any questions you have with your health care provider. ?Document Revised: 05/19/2021 Document Reviewed: 05/19/2021 ?Elsevier Patient Education ? 2022 Elsevier Inc. ? ?

## 2021-11-03 NOTE — Progress Notes (Signed)
Subjective:    Valerie Garner is a 25 y.o. [id number] Caucasian female who presents for a postpartum visit. She is 6 weeks postpartum following a spontaneous vaginal delivery at 39 gestational weeks. Anesthesia: epidural. I have fully reviewed the prenatal and intrapartum course. Postpartum course has been normal. Baby's course has been normal. Baby is feeding by breast. Bleeding no bleeding. Bowel function is normal. Bladder function is normal. Patient is not sexually active. Contraception method is  natural family planning . Postpartum depression screening: negative. Score 4.  Last pap 10/02/18 and was negative.  The following portions of the patient's history were reviewed and updated as appropriate: allergies, current medications, past medical history, past surgical history and problem list.  Review of Systems Pertinent items are noted in HPI.   Vitals:   11/03/21 1400  BP: 115/75  Pulse: 69  Weight: 219 lb 12.8 oz (99.7 kg)  Height: 5\' 4"  (1.626 m)   No LMP recorded.  Objective:   General:  alert, cooperative and no distress   Breasts:  deferred, no complaints  Lungs: clear to auscultation bilaterally  Heart:  regular rate and rhythm  Abdomen: soft, nontender   Vulva: normal  Vagina: normal vagina  Cervix:  closed  Corpus: Well-involuted  Adnexa:  Non-palpable  Rectal Exam: no hemorrhoids        Assessment:   Postpartum exam 6 wks s/p SVD Breast and bottle feeding Depression screening Contraception counseling   Plan:  :  NFP Follow up in: 6 months for annual or earlier if needed Pt state she had history with last baby with urinary incontinence . Encouraged Kegel exercise , if continued issue she will let me know and I will order pelvic floor thearpy.   , CNM

## 2021-11-08 ENCOUNTER — Telehealth: Payer: Self-pay | Admitting: Lactation Services

## 2021-11-08 NOTE — Telephone Encounter (Signed)
Valerie Garner called in after speaking with her midwife desiring an outpatient appointment. Her daughter is 19 weeks old and is BF well but she is also pumping to help build supply. Patient does state that at times baby tires out before becoming completely full and she pumps to provide breastmilk via bottle. Taralyn desires proper fitting for flange sizing and tips/strategies for building supply. Appointment made for 12/6 @ 3pm. Shaela told to bring pump, parts/pieces and where to enter the hospital at. At this time she declines a feeding assessment with baby.

## 2021-11-09 ENCOUNTER — Ambulatory Visit
Admission: RE | Admit: 2021-11-09 | Discharge: 2021-11-09 | Disposition: A | Discharge status: Home / Self Care | Payer: 59 | Source: Ambulatory Visit | Attending: Certified Nurse Midwife | Admitting: Certified Nurse Midwife

## 2021-11-09 ENCOUNTER — Other Ambulatory Visit: Payer: Self-pay

## 2021-11-09 NOTE — Lactation Note (Signed)
Lactation Consultation Note  Patient Name: Valerie Garner Today's Date: 11/09/2021 Reason for consult:  (outpatient) Age:25 y.o.  [name] came in for outpatient services with a purpose of proper flange fitting and tips for building supply through pumping.  LC was able to fit [name] for size 57mm flange for a tighter fit, explaining proper sizing and how the flow of the nipple should be. [facility] student was able to provide alternatives for ordering flanges with options of Spectra brand (which mom has) or Motif set that may be cheaper but will also work.  [facility] student provided tips for helping to build supply through pumping: -Power pumping (at least for 3 days) -Pumping during the night -Pumping first time in the morning -Pumping post feedings  Maternal Data Has patient been taught Hand Expression?: Yes Does the patient have breastfeeding experience prior to this delivery?: Yes How long did the patient breastfeed?: 1 month  Feeding Mother's Current Feeding Choice: Breast Milk  With extra time in the outpatient baby did wake and we offered to do a weighted feeding and observed feeding. Beginning Weight (clothed): 4194 Mom fed on the R breast for 16 minutes- Baby was on/off a lot trying to regulate the let down and flow of the supply. Loud swallows identified easily throughout the feeding.  Guidance was given to allow baby to fully empty one breast before moving to the other breast to ensure consumption of the hind milk.  Baby fed on the L breast for 6 minutes with better latch, staying for longer periods, and again audible swallows. Total transfer was only 86mL this feeding, but mom felt that baby wasn't as eager because she had just woken up.  LATCH Score Latch: Grasps breast easily, tongue down, lips flanged, rhythmical sucking.  Audible Swallowing: Spontaneous and intermittent  Type of Nipple: Everted at rest and after stimulation  Comfort (Breast/Nipple): Soft /  non-tender  Hold (Positioning): Assistance needed to correctly position infant at breast and maintain latch.  LATCH Score: 3  Mom voices desire to see if baby has gained enough weight to dial back on the amount of supplement or pumped milk she is offering via bottle post feedings. Mom was encouraged to follow up with her pediatrician at the next appointment (12/19) to determine if this would be appropriate and a good path to initiate.  Lactation Tools Discussed/Used Tools: Pump Breast pump type: Double-Electric Breast Pump Pump Education: Other (comment) (building supply tips/strategies) Reason for Pumping: build supply Pumping frequency: q 3 hours Pumped volume: 1 mL  Interventions Interventions: Adjust position;Support pillows;Education;DEBP  Discharge Pump: Personal (Spectra)  Consult Status Consult Status: Complete    Danford Bad 11/09/2021, 4:06 PM

## 2021-12-20 ENCOUNTER — Encounter: Payer: Self-pay | Admitting: Certified Nurse Midwife

## 2022-01-25 ENCOUNTER — Ambulatory Visit
Admission: RE | Admit: 2022-01-25 | Discharge: 2022-01-25 | Disposition: A | Discharge status: Home / Self Care | Payer: 59 | Source: Ambulatory Visit | Attending: Emergency Medicine | Admitting: Emergency Medicine

## 2022-01-25 ENCOUNTER — Other Ambulatory Visit: Payer: Self-pay

## 2022-01-25 VITALS — BP 126/75 | HR 69 | Temp 98.6°F | Resp 16

## 2022-01-25 DIAGNOSIS — H60392 Other infective otitis externa, left ear: Secondary | ICD-10-CM | POA: Diagnosis not present

## 2022-01-25 MED ORDER — CEFDINIR 300 MG PO CAPS: 300.0000 mg | ORAL_CAPSULE | Freq: Two times a day (BID) | ORAL | 0 refills | Status: AC

## 2022-01-25 NOTE — Discharge Instructions (Signed)
Today you are being treated for outer an infection  Take cefdinir twice a day for the next 10 days  Please discontinue any ear cleaning or placement of objects into the ear canal until all symptoms have resolved and all medication is complete  If you do not see any improvement in your symptoms in the next 72 hours please follow-up with urgent care for reevaluation

## 2022-01-25 NOTE — ED Triage Notes (Signed)
Pt reports pain and clear discharge in the left x 2 days.

## 2022-01-25 NOTE — ED Provider Notes (Signed)
[facility]    CSN: [id number] Arrival date & time: 01/25/22  1445      History   Chief Complaint Chief Complaint  Patient presents with   Appointment    [place]    HPI Leyonna Gehrmann Tobin Chad is a 26 y.o. female.   Patient presents with left ear pain and clear drainage for 2 days, associated itching with intermittent ear popping and muffled sounds.  Has not attempted treatment of symptoms.  Denies fever, chills, body aches, URI symptoms.  Endorses history of recurrent ear infections at least once a year left ear pain, clear dischaqrge for 2 days, itching,intermittent muffliness and popping.  Currently breast-feeding.    Past Medical History:  Diagnosis Date   Postpartum depression associated with first pregnancy     Patient Active Problem List   Diagnosis Date Noted   Type A blood, Rh positive 03/03/2021   Obesity in pregnancy 08/08/2019    Past Surgical History:  Procedure Laterality Date   adenoidectomy     TONSILLECTOMY Bilateral    WISDOM TOOTH EXTRACTION      OB History     Gravida  2   Para  2   Term  2   Preterm      AB      Living  2      SAB      IAB      Ectopic      Multiple  0   Live Births  2            Home Medications    Prior to Admission medications   Medication Sig Start Date End Date Taking? Authorizing Provider  acetaminophen (TYLENOL) 500 MG tablet Take 500 mg by mouth every 6 (six) hours as needed.    [provider]  calcium carbonate (TUMS EX) 750 MG chewable tablet Chew 1 tablet by mouth daily. As needed    [provider]  ibuprofen (ADVIL) 600 MG tablet Take 1 tablet (600 mg total) by mouth every 6 (six) hours. 09/24/21   Philip Aspen, CNM  Prenatal Vit-Fe Fumarate-FA (PRENATAL VITAMINS PO) Take by mouth.    [provider]  sertraline (ZOLOFT) 50 MG tablet Take 1 tablet (50 mg total) by mouth daily. Patient not taking: No sig reported 10/30/19 12/04/20   Philip Aspen, CNM    Family History Family History  Problem Relation Age of Onset   Healthy Mother    Healthy Father    Diabetes Paternal Grandfather    Breast cancer Neg Hx    Ovarian cancer Neg Hx    Colon cancer Neg Hx     Social History Social History   Tobacco Use   Smoking status: Never   Smokeless tobacco: Never  Vaping Use   Vaping Use: Never used  Substance Use Topics   Alcohol use: Not Currently   Drug use: Never     Allergies   Patient has no known allergies.   Review of Systems Review of Systems  Constitutional: Negative.   HENT:  Positive for ear discharge and ear pain. Negative for congestion, dental problem, drooling, facial swelling, hearing loss, mouth sores, nosebleeds, postnasal drip, rhinorrhea, sinus pressure, sinus pain, sneezing, sore throat, tinnitus, trouble swallowing and voice change.   Respiratory: Negative.    Cardiovascular: Negative.     Physical Exam Triage Vital Signs ED Triage Vitals  Enc Vitals Group     BP 01/25/22 1507 126/75  Pulse Rate 01/25/22 1507 69     Resp 01/25/22 1507 16     Temp 01/25/22 1507 98.6 F (37 C)     Temp Source 01/25/22 1507 Oral     SpO2 01/25/22 1507 98 %     Weight --      Height --      Head Circumference --      Peak Flow --      Pain Score 01/25/22 1508 5     Pain Loc --      Pain Edu? --      Excl. in [place]? --    No data found.  Updated Vital Signs BP 126/75 (BP Location: Left Arm)    Pulse 69    Temp 98.6 F (37 C) (Oral)    Resp 16    SpO2 98%    Breastfeeding Yes   Visual Acuity Right Eye Distance:   Left Eye Distance:   Bilateral Distance:    Right Eye Near:   Left Eye Near:    Bilateral Near:     Physical Exam Constitutional:      Appearance: Normal appearance.  HENT:     Head: Normocephalic.     Right Ear: Hearing, tympanic membrane, ear canal and external ear normal.     Left Ear: Hearing, tympanic membrane and external ear normal.     Ears:     Comments:  Mild to moderate swelling throughout the ear canal with Chyna Kneece puslike drainage, nontender Eyes:     Extraocular Movements: Extraocular movements intact.  Pulmonary:     Effort: Pulmonary effort is normal.  Skin:    General: Skin is warm and dry.  Neurological:     Mental Status: She is alert and oriented to person, place, and time. Mental status is at baseline.  Psychiatric:        Mood and Affect: Mood normal.        Behavior: Behavior normal.     UC Treatments / Results  Labs (all labs ordered are listed, but only abnormal results are displayed) Labs Reviewed - No data to display  EKG   Radiology No results found.  Procedures Procedures (including critical care time)  Medications Ordered in UC Medications - No data to display  Initial Impression / Assessment and Plan / UC Course  I have reviewed the triage vital signs and the nursing notes.  Pertinent labs & imaging results that were available during my care of the patient were reviewed by me and considered in my medical decision making (see chart for details).  Other infective acute otitis externa of left ear  Outer ear infection noted on exam, discussed with patient, cefdinir 10-day course prescribed for patient as she endorses when she was given eardrops in the past she had to return in 3 days for worsening symptoms, endorsed success with cefdinir, patient is currently breast-feeding, medication safe for use, advise discontinuation of any object placement into the ear canal, may use over-the-counter Tylenol for pain management, urgent care follow-up as needed, given precautions to return in 72 hours if no improvement seen Final Clinical Impressions(s) / UC Diagnoses   Final diagnoses:  None   Discharge Instructions   None    ED Prescriptions   None    PDMP not reviewed this encounter.   Hans Eden, NP 01/25/22 1627

## 2022-02-18 LAB — FETAL NONSTRESS TEST

## 2022-03-04 ENCOUNTER — Ambulatory Visit
Admission: RE | Admit: 2022-03-04 | Discharge: 2022-03-04 | Disposition: A | Discharge status: Home / Self Care | Payer: 59 | Source: Ambulatory Visit | Attending: Emergency Medicine | Admitting: Emergency Medicine

## 2022-03-04 VITALS — BP 126/90 | HR 73 | Temp 98.8°F | Resp 16

## 2022-03-04 DIAGNOSIS — H66013 Acute suppurative otitis media with spontaneous rupture of ear drum, bilateral: Secondary | ICD-10-CM

## 2022-03-04 MED ORDER — OFLOXACIN 0.3 % OT SOLN: 3.0000 [drp] | Freq: Two times a day (BID) | OTIC | 0 refills | Status: DC

## 2022-03-04 MED ORDER — AMOXICILLIN-POT CLAVULANATE 875-125 MG PO TABS: 1.0000 | ORAL_TABLET | Freq: Two times a day (BID) | ORAL | 0 refills | Status: AC

## 2022-03-04 MED ORDER — IBUPROFEN 600 MG PO TABS: 600.0000 mg | ORAL_TABLET | Freq: Four times a day (QID) | ORAL | 0 refills | Status: DC

## 2022-03-04 NOTE — ED Triage Notes (Signed)
Pt is here with bilateral ear pain since last night and sore throat and nasal congestion x 1 week.  ?

## 2022-03-04 NOTE — ED Provider Notes (Signed)
HPI ? ?SUBJECTIVE: ? ?Valerie Garner is a 26 y.o. female who presents with 1 week of upper respiratory symptoms with nasal congestion, rhinorrhea, postnasal drip, sore throat.  Her main concern today is bilateral ear fullness for the past 3 to 4 days that became a dull ache last night.  She reports muffled hearing and scant watery otorrhea.  No foreign body insertion, recent swimming, fevers.  No antibiotics in the past month.  No antipyretic in the past 6 hours.  She tried ibuprofen 600 mg and Goody's PM.  The ibuprofen helps.  Symptoms are worse with swallowing.  She has a past medical history of otitis externa and frequent otitis media.  No history of diabetes.  LMP: She is postpartum and breast-feeding.  She denies possibility of being pregnant.  PCP: She is establishing care [facility] in June. ? ?Past Medical History:  ?Diagnosis Date  ? Postpartum depression associated with first pregnancy   ? ? ?Past Surgical History:  ?Procedure Laterality Date  ? adenoidectomy    ? TONSILLECTOMY Bilateral   ? WISDOM TOOTH EXTRACTION    ? ? ?Family History  ?Problem Relation Age of Onset  ? Healthy Mother   ? Healthy Father   ? Diabetes Paternal Grandfather   ? Breast cancer Neg Hx   ? Ovarian cancer Neg Hx   ? Colon cancer Neg Hx   ? ? ?Social History  ? ?Tobacco Use  ? Smoking status: Never  ? Smokeless tobacco: Never  ?Vaping Use  ? Vaping Use: Never used  ?Substance Use Topics  ? Alcohol use: Not Currently  ? Drug use: Never  ? ? ?No current facility-administered medications for this encounter. ? ?Current Outpatient Medications:  ?  amoxicillin-clavulanate (AUGMENTIN) 875-125 MG tablet, Take 1 tablet by mouth 2 (two) times daily for 7 days., Disp: 14 tablet, Rfl: 0 ?  ofloxacin (FLOXIN) 0.3 % OTIC solution, Place 3 drops into both ears 2 (two) times daily., Disp: 5 mL, Rfl: 0 ?  acetaminophen (TYLENOL) 500 MG tablet, Take 500 mg by mouth every 6 (six) hours as needed., Disp: , Rfl:  ?  calcium  carbonate (TUMS EX) 750 MG chewable tablet, Chew 1 tablet by mouth daily. As needed, Disp: , Rfl:  ?  ibuprofen (ADVIL) 600 MG tablet, Take 1 tablet (600 mg total) by mouth every 6 (six) hours., Disp: 30 tablet, Rfl: 0 ?  Prenatal Vit-Fe Fumarate-FA (PRENATAL VITAMINS PO), Take by mouth., Disp: , Rfl:  ? ?No Known Allergies ? ? ?ROS ? ?As noted in HPI.  ? ?Physical Exam ? ?BP 126/90 (BP Location: Left Arm)   Pulse 73   Temp 98.8 ?F (37.1 ?C) (Oral)   Resp 16   SpO2 97%   Breastfeeding Yes  ? ?Constitutional: Well developed, well nourished, no acute distress ?Eyes:  EOMI, conjunctiva normal bilaterally ?HENT: Normocephalic, atraumatic,mucus membranes moist.  No pain with traction on pinna, palpation of tragus or mastoid bilaterally.  Positive yellow-white material with purulent otorrhea obscuring both TMs.  Hearing decreased, more on the right. ?Neck: Positive shotty cervical lymphadenopathy ?Respiratory: Normal inspiratory effort ?Cardiovascular: Normal rate ?GI: nondistended ?skin: No rash, skin intact ?Musculoskeletal: no deformities ?Neurologic: Alert & oriented x 3, no focal neuro deficits ?Psychiatric: Speech and behavior appropriate ? ? ?ED Course ? ? ?Medications - No data to display ? ?No orders of the defined types were placed in this encounter. ? ? ?No results found for this or any previous visit (from the  past 24 hour(s)). ?No results found. ? ?ED Clinical Impression ? ?1. Acute suppurative otitis media of both ears with spontaneous rupture of tympanic membranes, recurrence not specified   ?  ? ?ED Assessment/Plan ? ?Presentation concerning for bilateral suppurative otitis media with spontaneous tympanic membrane perforation.  Attempted to remove some of the material with a curette and Q-tip without any visualization of the tympanic membrane.  Did not irrigate due to concern for perforation.  Home with Augmentin, ofloxacin otic.  Advised patient to not get ear wet.  Follow-up with Dr. Willeen Cass at  [facility] if not better in a week.  ? ?Discussed MDM, treatment plan, and plan for follow-up with patient. patient agrees with plan.  ? ?Meds ordered this encounter  ?Medications  ? ibuprofen (ADVIL) 600 MG tablet  ?  Sig: Take 1 tablet (600 mg total) by mouth every 6 (six) hours.  ?  Dispense:  30 tablet  ?  Refill:  0  ? ofloxacin (FLOXIN) 0.3 % OTIC solution  ?  Sig: Place 3 drops into both ears 2 (two) times daily.  ?  Dispense:  5 mL  ?  Refill:  0  ? amoxicillin-clavulanate (AUGMENTIN) 875-125 MG tablet  ?  Sig: Take 1 tablet by mouth 2 (two) times daily for 7 days.  ?  Dispense:  14 tablet  ?  Refill:  0  ? ? ? ? ?*This clinic note was created using Scientist, clinical (histocompatibility and immunogenetics). Therefore, there may be occasional mistakes despite careful proofreading. ? ?? ? ?  ?Domenick Gong, MD ?03/04/22 1525 ? ?

## 2022-03-04 NOTE — Discharge Instructions (Addendum)
Finish antibiotics, even if you feel better.  Do not get your ears wet while you are showering.  Please follow-up with [facility] ear nose throat if not better in a week. ?

## 2022-03-24 ENCOUNTER — Ambulatory Visit
Admission: RE | Admit: 2022-03-24 | Discharge: 2022-03-24 | Disposition: A | Discharge status: Home / Self Care | Payer: 59 | Source: Ambulatory Visit | Attending: Emergency Medicine | Admitting: Emergency Medicine

## 2022-03-24 VITALS — BP 117/77 | HR 76 | Temp 98.1°F | Resp 18 | Ht 64.0 in | Wt 225.0 lb

## 2022-03-24 DIAGNOSIS — H60391 Other infective otitis externa, right ear: Secondary | ICD-10-CM | POA: Diagnosis not present

## 2022-03-24 MED ORDER — CEFDINIR 300 MG PO CAPS: 300.0000 mg | ORAL_CAPSULE | Freq: Two times a day (BID) | ORAL | 0 refills | Status: AC

## 2022-03-24 NOTE — ED Triage Notes (Signed)
Pt c/o cough, congestion, sore throat, ear pain and drainage x6days.  ? ?Pt states that her throat feels better but the ear is still hurting.  ?

## 2022-03-24 NOTE — ED Provider Notes (Signed)
?[facility] ? ? ? ?CSN: [id number] ?Arrival date & time: 03/24/22  1046 ? ? ?  ? ?History   ?Chief Complaint ?Chief Complaint  ?Patient presents with  ? Ear Drainage  ?  Throat & neck pain, cold symptoms for 5 days - Entered by patient  ? Cough  ? Nasal Congestion  ? ? ?HPI ?Valerie Garner is a 26 y.o. female.  ? ?Patient presents with bilateral ear pain, right worse than left for 6 days, endorses that the right ear is itching as a purulent drainage that has been persistent.  Drainage is described as watery and clear to yellow and thick.  Decreased hearing on the right side.  Symptoms initially accompanied a viral URI with cough congestion and a sore throat.  Has attempted use of Tylenol, ibuprofen, Benadryl which have not been effective.  Patient recently had tympanic membrane rupture bilaterally 3 weeks ago.  Has had reoccurring ear infections. ? ?Past Medical History:  ?Diagnosis Date  ? Postpartum depression associated with first pregnancy   ? ? ?Patient Active Problem List  ? Diagnosis Date Noted  ? Type A blood, Rh positive 03/03/2021  ? Obesity in pregnancy 08/08/2019  ? ? ?Past Surgical History:  ?Procedure Laterality Date  ? adenoidectomy    ? TONSILLECTOMY Bilateral   ? WISDOM TOOTH EXTRACTION    ? ? ?OB History   ? ? Gravida  ?2  ? Para  ?2  ? Term  ?2  ? Preterm  ?   ? AB  ?   ? Living  ?2  ?  ? ? SAB  ?   ? IAB  ?   ? Ectopic  ?   ? Multiple  ?0  ? Live Births  ?2  ?   ?  ?  ? ? ? ?Home Medications   ? ?Prior to Admission medications   ?Medication Sig Start Date End Date Taking? Authorizing Provider  ?acetaminophen (TYLENOL) 500 MG tablet Take 500 mg by mouth every 6 (six) hours as needed.   Yes [provider]  ?calcium carbonate (TUMS EX) 750 MG chewable tablet Chew 1 tablet by mouth daily. As needed   Yes [provider]  ?ibuprofen (ADVIL) 600 MG tablet Take 1 tablet (600 mg total) by mouth every 6 (six) hours. 03/04/22  Yes Domenick Gong, MD  ?Prenatal  Vit-Fe Fumarate-FA (PRENATAL VITAMINS PO) Take by mouth.   Yes [provider]  ?ofloxacin (FLOXIN) 0.3 % OTIC solution Place 3 drops into both ears 2 (two) times daily. 03/04/22   Domenick Gong, MD  ?sertraline (ZOLOFT) 50 MG tablet Take 1 tablet (50 mg total) by mouth daily. ?Patient not taking: No sig reported 10/30/19 12/04/20  [name], CNM  ? ? ?Family History ?Family History  ?Problem Relation Age of Onset  ? Healthy Mother   ? Healthy Father   ? Diabetes Paternal Grandfather   ? Breast cancer Neg Hx   ? Ovarian cancer Neg Hx   ? Colon cancer Neg Hx   ? ? ?Social History ?Social History  ? ?Tobacco Use  ? Smoking status: Never  ? Smokeless tobacco: Never  ?Vaping Use  ? Vaping Use: Never used  ?Substance Use Topics  ? Alcohol use: Not Currently  ? Drug use: Never  ? ? ? ?Allergies   ?Patient has no known allergies. ? ? ?Review of Systems ?Review of Systems ?Defer to HPI  ? ? ?Physical Exam ?Triage Vital Signs ?ED Triage  Vitals  ?Enc Vitals Group  ?   BP 03/24/22 1137 117/77  ?   Pulse Rate 03/24/22 1137 76  ?   Resp 03/24/22 1137 18  ?   Temp 03/24/22 1137 98.1 ?F (36.7 ?C)  ?   Temp Source 03/24/22 1137 Oral  ?   SpO2 03/24/22 1137 100 %  ?   Weight 03/24/22 1135 225 lb (102.1 kg)  ?   Height 03/24/22 1135 5\' 4"  (1.626 m)  ?   Head Circumference --   ?   Peak Flow --   ?   Pain Score 03/24/22 1135 0  ?   Pain Loc --   ?   Pain Edu? --   ?   Excl. in [place]? --   ? ?No data found. ? ?Updated Vital Signs ?BP 117/77 (BP Location: Left Arm)   Pulse 76   Temp 98.1 ?F (36.7 ?C) (Oral)   Resp 18   Ht 5\' 4"  (1.626 m)   Wt 225 lb (102.1 kg)   LMP 03/11/2022   SpO2 100%   BMI 38.62 kg/m?  ? ?Visual Acuity ?Right Eye Distance:   ?Left Eye Distance:   ?Bilateral Distance:   ? ?Right Eye Near:   ?Left Eye Near:    ?Bilateral Near:    ? ?Physical Exam ?Constitutional:   ?   Appearance: Normal appearance.  ?HENT:  ?   Head: Normocephalic.  ?   Left Ear: Hearing, tympanic membrane, ear canal and  external ear normal.  ?   Ears:  ?   Comments: Purulent Quinta Eimer drainage present in the ear canal with mild tenderness, tympanic membrane is partially ruptured ?Eyes:  ?   Extraocular Movements: Extraocular movements intact.  ?Pulmonary:  ?   Effort: Pulmonary effort is normal.  ?Skin: ?   General: Skin is warm and dry.  ?Neurological:  ?   Mental Status: She is alert and oriented to person, place, and time. Mental status is at baseline.  ?Psychiatric:     ?   Mood and Affect: Mood normal.     ?   Behavior: Behavior normal.  ? ? ? ?UC Treatments / Results  ?Labs ?(all labs ordered are listed, but only abnormal results are displayed) ?Labs Reviewed - No data to display ? ?EKG ? ? ?Radiology ?No results found. ? ?Procedures ?Procedures (including critical care time) ? ?Medications Ordered in UC ?Medications - No data to display ? ?Initial Impression / Assessment and Plan / UC Course  ?I have reviewed the triage vital signs and the nursing notes. ? ?Pertinent labs & imaging results that were available during my care of the patient were reviewed by me and considered in my medical decision making (see chart for details). ? ?Infective otitis externa of right ear ? ?Vital signs are stable and patient is in no signs of distress, has had similar symptoms in the past and had success with cefdinir orally, will represcribe medication for 10-day course, advised against any ear cleaning or object placement in the ear canal, may continue use of Tylenol, ibuprofen for management of pain as well as warm compresses to the affected ear, strongly recommended patient schedule an appointment with ear nose and throat due to reoccurring infections ?Final Clinical Impressions(s) / UC Diagnoses  ? ?Final diagnoses:  ?None  ? ?Discharge Instructions   ?None ?  ? ?ED Prescriptions   ?None ?  ? ?PDMP not reviewed this encounter. ?  ? , NP ?03/24/22 1206 ? ?

## 2022-03-24 NOTE — Discharge Instructions (Signed)
Today you are being treated for an infection of the ear canal  ? ?Take cefdinir twice daily for 10 days, you should begin to see improvement after 48 hours of medication use and then it should progressively get better ? ?You may use Tylenol or ibuprofen for management of discomfort ? ?May hold warm compresses to the ear for additional comfort ? ?Please not attempted any ear cleaning or object or fluid placement into the ear canal to prevent further irritation  ?

## 2022-04-11 DIAGNOSIS — H60339 Swimmer's ear, unspecified ear: Secondary | ICD-10-CM | POA: Diagnosis not present

## 2022-04-11 DIAGNOSIS — H6123 Impacted cerumen, bilateral: Secondary | ICD-10-CM | POA: Diagnosis not present

## 2022-04-25 DIAGNOSIS — H6981 Other specified disorders of Eustachian tube, right ear: Secondary | ICD-10-CM | POA: Diagnosis not present

## 2022-04-25 DIAGNOSIS — H60331 Swimmer's ear, right ear: Secondary | ICD-10-CM | POA: Diagnosis not present

## 2022-05-04 ENCOUNTER — Encounter: Payer: Self-pay | Admitting: Certified Nurse Midwife

## 2022-05-04 ENCOUNTER — Ambulatory Visit (INDEPENDENT_AMBULATORY_CARE_PROVIDER_SITE_OTHER): Payer: 59 | Admitting: Certified Nurse Midwife

## 2022-05-04 ENCOUNTER — Other Ambulatory Visit (HOSPITAL_COMMUNITY)
Admission: RE | Admit: 2022-05-04 | Discharge: 2022-05-04 | Disposition: A | Discharge status: Home / Self Care | Payer: 59 | Source: Ambulatory Visit | Attending: Certified Nurse Midwife | Admitting: Certified Nurse Midwife

## 2022-05-04 VITALS — BP 129/84 | HR 76 | Ht 64.0 in | Wt 233.9 lb

## 2022-05-04 DIAGNOSIS — Z124 Encounter for screening for malignant neoplasm of cervix: Secondary | ICD-10-CM

## 2022-05-04 DIAGNOSIS — Z01419 Encounter for gynecological examination (general) (routine) without abnormal findings: Secondary | ICD-10-CM

## 2022-05-04 NOTE — Progress Notes (Signed)
GYNECOLOGY ANNUAL PREVENTATIVE CARE ENCOUNTER NOTE  History:     Valerie Garner is a 26 y.o. [phone] female here for a routine annual gynecologic exam.  Current complaints: none.   Denies abnormal vaginal bleeding, discharge, pelvic pain, problems with intercourse or other gynecologic concerns.     Social Relationship: married Living:spouse and children ( son & daughter) Work: stay at home mom Exercise: walking few times a week Smoke/Alcohol/drug use: denies use   Gynecologic History No LMP recorded (lmp unknown). Contraception:  natural family planning Last Pap: 10/02/2018. Results were: normal  Last mammogram: normal.   Obstetric History OB History  Gravida Para Term Preterm AB Living  2 2 2     2   SAB IAB Ectopic Multiple Live Births        0 2    # Outcome Date GA Lbr Len/2nd Weight Sex Delivery Anes PTL Lv  2 Term 09/23/21 [redacted]w[redacted]d / 00:04 7 lb 6.5 oz (3.36 kg) F Vag-Spont EPI  LIV  1 Term 08/22/19 [redacted]w[redacted]d 10:35 / 00:48 6 lb 8.1 oz (2.95 kg) M Vag-Spont EPI N LIV    Past Medical History:  Diagnosis Date   Postpartum depression associated with first pregnancy     Past Surgical History:  Procedure Laterality Date   adenoidectomy     TONSILLECTOMY Bilateral    WISDOM TOOTH EXTRACTION      Current Outpatient Medications on File Prior to Visit  Medication Sig Dispense Refill   acetaminophen (TYLENOL) 500 MG tablet Take 500 mg by mouth every 6 (six) hours as needed.     calcium carbonate (TUMS EX) 750 MG chewable tablet Chew 1 tablet by mouth daily. As needed     ibuprofen (ADVIL) 600 MG tablet Take 1 tablet (600 mg total) by mouth every 6 (six) hours. 30 tablet 0   ofloxacin (FLOXIN) 0.3 % OTIC solution Place 3 drops into both ears 2 (two) times daily. 5 mL 0   [DISCONTINUED] sertraline (ZOLOFT) 50 MG tablet Take 1 tablet (50 mg total) by mouth daily. (Patient not taking: No sig reported) 30 tablet 1   No current facility-administered medications on  file prior to visit.    No Known Allergies  Social History:  reports that she has never smoked. She has never used smokeless tobacco. She reports that she does not currently use alcohol. She reports that she does not use drugs.  Family History  Problem Relation Age of Onset   Healthy Mother    Healthy Father    Diabetes Paternal Grandfather    Breast cancer Neg Hx    Ovarian cancer Neg Hx    Colon cancer Neg Hx     The following portions of the patient's history were reviewed and updated as appropriate: allergies, current medications, past family history, past medical history, past social history, past surgical history and problem list.  Review of Systems Pertinent items noted in HPI and remainder of comprehensive ROS otherwise negative.  Physical Exam:  BP 129/84   Pulse 76   Ht 5\' 4"  (1.626 m)   Wt 233 lb 14.4 oz (106.1 kg)   LMP  (LMP Unknown)   BMI 40.15 kg/m  CONSTITUTIONAL: Well-developed, well-nourished,obese female in no acute distress.  HENT:  Normocephalic, atraumatic, External right and left ear normal. Oropharynx is clear and moist EYES: Conjunctivae and EOM are normal. Pupils are equal, round, and reactive to light. No scleral icterus.  NECK: Normal range of motion, supple, no masses.  Normal thyroid.  SKIN: Skin is warm and dry. No rash noted. Not diaphoretic. No erythema. No pallor. MUSCULOSKELETAL: Normal range of motion. No tenderness.  No cyanosis, clubbing, or edema.  2+ distal pulses. NEUROLOGIC: Alert and oriented to person, place, and time. Normal reflexes, muscle tone coordination.  PSYCHIATRIC: Normal mood and affect. Normal behavior. Normal judgment and thought content. CARDIOVASCULAR: Normal heart rate noted, regular rhythm RESPIRATORY: Clear to auscultation bilaterally. Effort and breath sounds normal, no problems with respiration noted. BREASTS: Symmetric in size. No masses, tenderness, skin changes, nipple drainage, or lymphadenopathy bilaterally.   ABDOMEN: Soft, no distention noted.  No tenderness, rebound or guarding.  PELVIC: Normal appearing external genitalia and urethral meatus; normal appearing vaginal mucosa and cervix.  No abnormal discharge noted.  Pap smear obtained.  Contact bleeding. Normal uterine size, no other palpable masses, no uterine or adnexal tenderness.  .   Assessment and Plan:    1. Well woman exam with routine gynecological exam   Pap: Will follow up results of pap smear and manage accordingly. Mammogram : n/a Labs: declines Refills: none Referral: none Routine preventative health maintenance measures emphasized. Please refer to After Visit Summary for other counseling recommendations.      [name], CNM [facility],  [facility]

## 2022-05-09 LAB — CYTOLOGY - PAP: Diagnosis: NEGATIVE

## 2022-05-25 ENCOUNTER — Encounter: Payer: Self-pay | Admitting: Family Medicine

## 2022-05-25 ENCOUNTER — Ambulatory Visit (INDEPENDENT_AMBULATORY_CARE_PROVIDER_SITE_OTHER): Payer: 59 | Admitting: Family Medicine

## 2022-05-25 VITALS — BP 118/81 | HR 71 | Temp 98.1°F | Ht 64.0 in | Wt 235.0 lb

## 2022-05-25 DIAGNOSIS — Z0000 Encounter for general adult medical examination without abnormal findings: Secondary | ICD-10-CM

## 2022-05-25 LAB — MICROSCOPIC EXAMINATION
Bacteria, UA: NONE SEEN
WBC, UA: NONE SEEN /hpf (ref 0–5)

## 2022-05-25 LAB — URINALYSIS, ROUTINE W REFLEX MICROSCOPIC
Bilirubin, UA: NEGATIVE
Glucose, UA: NEGATIVE
Ketones, UA: NEGATIVE
Nitrite, UA: NEGATIVE
Protein,UA: NEGATIVE
Specific Gravity, UA: 1.02 (ref 1.005–1.030)
Urobilinogen, Ur: 0.2 mg/dL (ref 0.2–1.0)
pH, UA: 6 (ref 5.0–7.5)

## 2022-05-25 NOTE — Progress Notes (Signed)
BP 118/81   Pulse 71   Temp 98.1 F (36.7 C)   Ht 5\' 4"  (1.626 m)   Wt 235 lb (106.6 kg)   LMP  (LMP Unknown)   SpO2 98%   BMI 40.34 kg/m    Subjective:    Patient ID: , female    DOB: Feb 02, 1996, 26 y.o.   MRN: [id number]  HPI: Valerie Garner is a 26 y.o. female presenting on 05/25/2022 for comprehensive medical examination. Current medical complaints include: none  She currently lives with: husband and kids Menopausal Symptoms: no  Depression Screen done today and results listed below:     05/25/2022   11:07 AM 10/08/2021   11:34 AM 03/12/2021   11:02 AM 02/26/2021    2:02 PM 10/09/2019   11:00 AM  Depression screen PHQ 2/9  Decreased Interest 0 0 0 0 0  Down, Depressed, Hopeless 0 0 0 0 0  PHQ - 2 Score 0 0 0 0 0  Altered sleeping  0  1 1  Tired, decreased energy  1  1 2   Change in appetite  0  0 0  Feeling bad or failure about yourself   0  0 0  Trouble concentrating  0  0 0  Moving slowly or fidgety/restless  0  0 0  Suicidal thoughts  0  0 0  PHQ-9 Score  1  2 3   Difficult doing work/chores     Not difficult at all    Past Medical History:  Past Medical History:  Diagnosis Date   Postpartum depression associated with first pregnancy     Surgical History:  Past Surgical History:  Procedure Laterality Date   adenoidectomy     TONSILLECTOMY Bilateral    WISDOM TOOTH EXTRACTION      Medications:  Current Outpatient Medications on File Prior to Visit  Medication Sig   ofloxacin (FLOXIN) 0.3 % OTIC solution Place 3 drops into both ears 2 (two) times daily.   [DISCONTINUED] sertraline (ZOLOFT) 50 MG tablet Take 1 tablet (50 mg total) by mouth daily. (Patient not taking: No sig reported)   No current facility-administered medications on file prior to visit.    Allergies:  No Known Allergies  Social History:  Social History   Socioeconomic History   Marital status: Married    Spouse name: 4/9   Number of  children: 1   Years of education: Not on file   Highest education level: Not on file  Occupational History   Not on file  Tobacco Use   Smoking status: Never   Smokeless tobacco: Never  Vaping Use   Vaping Use: Never used  Substance and Sexual Activity   Alcohol use: Not Currently   Drug use: Never   Sexual activity: Yes    Birth control/protection: None  Other Topics Concern   Not on file  Social History Narrative   Not on file   Social Determinants of Health   Financial Resource Strain: Not on file  Food Insecurity: Not on file  Transportation Needs: Not on file  Physical Activity: Not on file  Stress: Not on file  Social Connections: Not on file  Intimate Partner Violence: Not on file   Social History   Tobacco Use  Smoking Status Never  Smokeless Tobacco Never   Social History   Substance and Sexual Activity  Alcohol Use Not Currently    Family History:  Family History  Problem Relation Age of Onset  Healthy Mother    Healthy Father    Diabetes Paternal Grandfather    Breast cancer Neg Hx    Ovarian cancer Neg Hx    Colon cancer Neg Hx     Past medical history, surgical history, medications, allergies, family history and social history reviewed with patient today and changes made to appropriate areas of the chart.   Review of Systems  Constitutional: Negative.   HENT: Negative.    Eyes: Negative.   Respiratory: Negative.    Cardiovascular: Negative.   Gastrointestinal: Negative.   Genitourinary: Negative.   Musculoskeletal: Negative.   Skin: Negative.   Neurological: Negative.   Endo/Heme/Allergies: Negative.   Psychiatric/Behavioral: Negative.     All other ROS negative except what is listed above and in the HPI.      Objective:    BP 118/81   Pulse 71   Temp 98.1 F (36.7 C)   Ht 5\' 4"  (1.626 m)   Wt 235 lb (106.6 kg)   LMP  (LMP Unknown)   SpO2 98%   BMI 40.34 kg/m   Wt Readings from Last 3 Encounters:  05/25/22 235 lb  (106.6 kg)  05/04/22 233 lb 14.4 oz (106.1 kg)  03/24/22 225 lb (102.1 kg)    Physical Exam Vitals and nursing note reviewed.  Constitutional:      General: She is not in acute distress.    Appearance: Normal appearance. She is obese. She is not ill-appearing, toxic-appearing or diaphoretic.  HENT:     Head: Normocephalic and atraumatic.     Right Ear: Tympanic membrane, ear canal and external ear normal. There is no impacted cerumen.     Left Ear: Tympanic membrane, ear canal and external ear normal. There is no impacted cerumen.     Nose: Nose normal. No congestion or rhinorrhea.     Mouth/Throat:     Mouth: Mucous membranes are moist.     Pharynx: Oropharynx is clear. No oropharyngeal exudate or posterior oropharyngeal erythema.  Eyes:     General: No scleral icterus.       Right eye: No discharge.        Left eye: No discharge.     Extraocular Movements: Extraocular movements intact.     Conjunctiva/sclera: Conjunctivae normal.     Pupils: Pupils are equal, round, and reactive to light.  Neck:     Vascular: No carotid bruit.  Cardiovascular:     Rate and Rhythm: Normal rate and regular rhythm.     Pulses: Normal pulses.     Heart sounds: No murmur heard.    No friction rub. No gallop.  Pulmonary:     Effort: Pulmonary effort is normal. No respiratory distress.     Breath sounds: Normal breath sounds. No stridor. No wheezing, rhonchi or rales.  Chest:     Chest wall: No tenderness.  Abdominal:     General: Abdomen is flat. Bowel sounds are normal. There is no distension.     Palpations: Abdomen is soft. There is no mass.     Tenderness: There is no abdominal tenderness. There is no right CVA tenderness, left CVA tenderness, guarding or rebound.     Hernia: No hernia is present.  Genitourinary:    Comments: Breast and pelvic exams deferred with shared decision making Musculoskeletal:        General: No swelling, tenderness, deformity or signs of injury.     Cervical  back: Normal range of motion and neck supple. No rigidity. No  muscular tenderness.     Right lower leg: No edema.     Left lower leg: No edema.  Lymphadenopathy:     Cervical: No cervical adenopathy.  Skin:    General: Skin is warm and dry.     Capillary Refill: Capillary refill takes less than 2 seconds.     Coloration: Skin is not jaundiced or pale.     Findings: No bruising, erythema, lesion or rash.  Neurological:     General: No focal deficit present.     Mental Status: She is alert and oriented to person, place, and time. Mental status is at baseline.     Cranial Nerves: No cranial nerve deficit.     Sensory: No sensory deficit.     Motor: No weakness.     Coordination: Coordination normal.     Gait: Gait normal.     Deep Tendon Reflexes: Reflexes normal.  Psychiatric:        Mood and Affect: Mood normal.        Behavior: Behavior normal.        Thought Content: Thought content normal.        Judgment: Judgment normal.     Results for orders placed or performed in visit on 05/04/22  Cytology - PAP  Result Value Ref Range   Adequacy      Satisfactory for evaluation; transformation zone component PRESENT.   Diagnosis      - Negative for intraepithelial lesion or malignancy (NILM)      Assessment & Plan:   Problem List Items Addressed This Visit   None Visit Diagnoses     Routine general medical examination at a health care facility    -  Primary   Vaccines up to date. Screening labs checked today. Pap up to date. Continue diet and exercise. Call with any concerns. Continue to monitor.    Relevant Orders   CBC with Differential/Platelet   Comprehensive metabolic panel   Lipid Panel w/o Chol/HDL Ratio   Urinalysis, Routine w reflex microscopic   TSH        Follow up plan: Return in about 1 year (around 05/26/2023) for physical.   LABORATORY TESTING:  - Pap smear: up to date  IMMUNIZATIONS:   - Tdap: Tetanus vaccination status reviewed: last tetanus  booster within 10 years. - Influenza: Postponed to flu season - Pneumovax: Not applicable - Prevnar: Not applicable - COVID: Refused - HPV: Refused  PATIENT COUNSELING:   Advised to take 1 mg of folate supplement per day if capable of pregnancy.   Sexuality: Discussed sexually transmitted diseases, partner selection, use of condoms, avoidance of unintended pregnancy  and contraceptive alternatives.   Advised to avoid cigarette smoking.  I discussed with the patient that most people either abstain from alcohol or drink within safe limits (<=14/week and <=4 drinks/occasion for males, <=7/weeks and <= 3 drinks/occasion for females) and that the risk for alcohol disorders and other health effects rises proportionally with the number of drinks per week and how often a drinker exceeds daily limits.  Discussed cessation/primary prevention of drug use and availability of treatment for abuse.   Diet: Encouraged to adjust caloric intake to maintain  or achieve ideal body weight, to reduce intake of dietary saturated fat and total fat, to limit sodium intake by avoiding high sodium foods and not adding table salt, and to maintain adequate dietary potassium and calcium preferably from fresh fruits, vegetables, and low-fat dairy products.    stressed the  importance of regular exercise  Injury prevention: Discussed safety belts, safety helmets, smoke detector, smoking near bedding or upholstery.   Dental health: Discussed importance of regular tooth brushing, flossing, and dental visits.    NEXT PREVENTATIVE PHYSICAL DUE IN 1 YEAR. Return in about 1 year (around 05/26/2023) for physical.

## 2022-05-26 LAB — COMPREHENSIVE METABOLIC PANEL
ALT: 12 IU/L (ref 0–32)
AST: 13 IU/L (ref 0–40)
Albumin/Globulin Ratio: 1.8 (ref 1.2–2.2)
Albumin: 4.7 g/dL (ref 3.9–5.0)
Alkaline Phosphatase: 87 IU/L (ref 44–121)
BUN/Creatinine Ratio: 17 (ref 9–23)
BUN: 12 mg/dL (ref 6–20)
Bilirubin Total: 0.2 mg/dL (ref 0.0–1.2)
CO2: 21 mmol/L (ref 20–29)
Calcium: 9.3 mg/dL (ref 8.7–10.2)
Chloride: 105 mmol/L (ref 96–106)
Creatinine, Ser: 0.72 mg/dL (ref 0.57–1.00)
Globulin, Total: 2.6 g/dL (ref 1.5–4.5)
Glucose: 92 mg/dL (ref 70–99)
Potassium: 4.2 mmol/L (ref 3.5–5.2)
Sodium: 140 mmol/L (ref 134–144)
Total Protein: 7.3 g/dL (ref 6.0–8.5)
eGFR: 119 mL/min/{1.73_m2} (ref >59)

## 2022-05-26 LAB — CBC WITH DIFFERENTIAL/PLATELET
Basophils Absolute: 0.1 10*3/uL (ref 0.0–0.2)
Basos: 1 %
EOS (ABSOLUTE): 0.2 10*3/uL (ref 0.0–0.4)
Eos: 3 %
Hematocrit: 40.1 % (ref 34.0–46.6)
Hemoglobin: 13.3 g/dL (ref 11.1–15.9)
Immature Grans (Abs): 0 10*3/uL (ref 0.0–0.1)
Immature Granulocytes: 0 %
Lymphocytes Absolute: 2.5 10*3/uL (ref 0.7–3.1)
Lymphs: 38 %
MCH: 29.5 pg (ref 26.6–33.0)
MCHC: 33.2 g/dL (ref 31.5–35.7)
MCV: 89 fL (ref 79–97)
Monocytes Absolute: 0.4 10*3/uL (ref 0.1–0.9)
Monocytes: 7 %
Neutrophils Absolute: 3.4 10*3/uL (ref 1.4–7.0)
Neutrophils: 51 %
Platelets: 346 10*3/uL (ref 150–450)
RBC: 4.51 x10E6/uL (ref 3.77–5.28)
RDW: 12.5 % (ref 11.7–15.4)
WBC: 6.5 10*3/uL (ref 3.4–10.8)

## 2022-05-26 LAB — LIPID PANEL W/O CHOL/HDL RATIO
Cholesterol, Total: 187 mg/dL (ref 100–199)
HDL: 52 mg/dL (ref >39)
LDL Chol Calc (NIH): 106 mg/dL — ABNORMAL HIGH (ref 0–99)
Triglycerides: 164 mg/dL — ABNORMAL HIGH (ref 0–149)
VLDL Cholesterol Cal: 29 mg/dL (ref 5–40)

## 2022-05-26 LAB — TSH: TSH: 2.88 u[IU]/mL (ref 0.450–4.500)

## 2022-09-09 ENCOUNTER — Encounter: Payer: Self-pay | Admitting: Certified Nurse Midwife

## 2022-09-15 ENCOUNTER — Encounter: Payer: Self-pay | Admitting: Certified Nurse Midwife

## 2022-12-05 NOTE — L&D Delivery Note (Signed)
Delivery Note   Valerie Garner Valerie Garner is a 27 y.o. [id number] at [redacted]w[redacted]d based on 9 week ultrasound performed 02/21/2023 with an Estimated Date of Delivery: 09/26/23  PRE-OPERATIVE DIAGNOSIS:  1) [redacted]w[redacted]d pregnancy.  2) BMI of 39.99 in pregnancy  POST-OPERATIVE DIAGNOSIS:  1) [redacted]w[redacted]d pregnancy s/p Vaginal, Spontaneous   Delivery Type: Vaginal, Spontaneous   Delivery Anesthesia: Epidural  Labor Complications:  none    ESTIMATED BLOOD LOSS: 250 ml    FINDINGS:   1) female infant, Apgar scores of 8   at 1 minute and 9   at 5 minutes with birthweight pending.    SPECIMENS:   PLACENTA:   Appearance: Intact   Removal: Spontaneous     Disposition:    CORD BLOOD: discarded  DISPOSITION:  Infant left in stable condition in the delivery room, with L&D personnel, mother, and partner.   NARRATIVE SUMMARY: Labor course:  Valerie Garner is a [id number] at [redacted]w[redacted]d who presented to Labor & Delivery for labor management. Her initial cervical exam was 5/60/-2. Labor proceeded with AROM augmentation and she was found to be completely dilated at 1820.  With excellent maternal pushing effort, she birthed a vigorous female infant at 68. There was no nuchal cord. The shoulders were birthed without difficulty. The infant was placed skin-to-skin with the mother. The cord was doubly clamped and cut when pulsations ceased.  The placenta delivered spontaneously and was noted to be intact with a 3VC. A perineal and vaginal examination was performed. Episiotomy/Lacerations: None Mother and baby were left in stable condition with family at bedside.    Eloise Levels, Student-MidWife 09/20/2023 7:38 PM   Carie Caddy, CNM was available for all portions of care.

## 2022-12-05 NOTE — L&D Delivery Note (Addendum)
Delivery Note   Valerie Garner Valerie Garner is a 27 y.o. [id number] at [redacted]w[redacted]d based on 9 week ultrasound performed 02/21/2023 with an Estimated Date of Delivery: 09/26/23  PRE-OPERATIVE DIAGNOSIS:  1) [redacted]w[redacted]d pregnancy.  2) BMI of 39.99 in pregnancy  POST-OPERATIVE DIAGNOSIS:  1) [redacted]w[redacted]d pregnancy s/p Vaginal, Spontaneous   Delivery Type: Vaginal, Spontaneous   Delivery Anesthesia: Epidural  Labor Complications:  none    ESTIMATED BLOOD LOSS: 250 ml    FINDINGS:   1) female infant, Apgar scores of 8   at 1 minute and 9   at 5 minutes with birthweight pending.    SPECIMENS:   PLACENTA:   Appearance: Intact   Removal: Spontaneous     Disposition:    CORD BLOOD: discarded  DISPOSITION:  Infant left in stable condition in the delivery room, with L&D personnel, mother, and partner.   NARRATIVE SUMMARY: Labor course:  [name] is a [id number] at [redacted]w[redacted]d who presented to Labor & Delivery for labor management. Her initial cervical exam was 5/60/-2. Labor proceeded with AROM augmentation and she was found to be completely dilated at 1820.  With excellent maternal pushing effort, she birthed a vigorous female infant at 61. There was no nuchal cord. The shoulders were birthed without difficulty. The infant was placed skin-to-skin with the mother. The cord was doubly clamped and cut when pulsations ceased.  The placenta delivered spontaneously and was noted to be intact with a 3VC. A perineal and vaginal examination was performed. Episiotomy/Lacerations: None Mother and baby were left in stable condition with family at bedside.    Cindra Eves, SNM present for birth   [name] [facility], CNM 09/28/2023 11:35 AM

## 2022-12-15 ENCOUNTER — Ambulatory Visit (INDEPENDENT_AMBULATORY_CARE_PROVIDER_SITE_OTHER): Payer: 59 | Admitting: Certified Nurse Midwife

## 2022-12-15 ENCOUNTER — Encounter: Payer: Self-pay | Admitting: Certified Nurse Midwife

## 2022-12-15 VITALS — BP 136/87 | HR 65 | Wt 241.0 lb

## 2022-12-15 DIAGNOSIS — N907 Vulvar cyst: Secondary | ICD-10-CM | POA: Diagnosis not present

## 2022-12-15 NOTE — Progress Notes (Signed)
GYN ENCOUNTER NOTE  Subjective:       Valerie Garner is a 27 y.o. [phone] female is here for gynecologic evaluation of the following issues:  1. Small bump left labia for several months.She denies pain with it, She only notices it when she wipe/washes. She denies that it has changed in size. She denies potential for STD.      Gynecologic History Patient's last menstrual period was 12/07/2022 (exact date). Contraception:  natural family planning  Last Pap: 05/04/2022. Results were: normal Last mammogram: n/a .    Obstetric History OB History  Gravida Para Term Preterm AB Living  2 2 2     2   SAB IAB Ectopic Multiple Live Births        0 2    # Outcome Date GA Lbr Len/2nd Weight Sex Delivery Anes PTL Lv  2 Term 09/23/21 [redacted]w[redacted]d / 00:04 7 lb 6.5 oz (3.36 kg) F Vag-Spont EPI  LIV  1 Term 08/22/19 [redacted]w[redacted]d 10:35 / 00:48 6 lb 8.1 oz (2.95 kg) M Vag-Spont EPI N LIV    Past Medical History:  Diagnosis Date   Postpartum depression associated with first pregnancy     Past Surgical History:  Procedure Laterality Date   adenoidectomy     TONSILLECTOMY Bilateral    WISDOM TOOTH EXTRACTION      Current Outpatient Medications on File Prior to Visit  Medication Sig Dispense Refill   ofloxacin (FLOXIN) 0.3 % OTIC solution Place 3 drops into both ears 2 (two) times daily. 5 mL 0   [DISCONTINUED] sertraline (ZOLOFT) 50 MG tablet Take 1 tablet (50 mg total) by mouth daily. (Patient not taking: No sig reported) 30 tablet 1   No current facility-administered medications on file prior to visit.    No Known Allergies  Social History   Socioeconomic History   Marital status: Married    Spouse name:  Main   Number of children: 1   Years of education: Not on file   Highest education level: Not on file  Occupational History   Not on file  Tobacco Use   Smoking status: Never   Smokeless tobacco: Never  Vaping Use   Vaping Use: Never used  Substance and Sexual Activity    Alcohol use: Not Currently   Drug use: Never   Sexual activity: Yes    Birth control/protection: None  Other Topics Concern   Not on file  Social History Narrative   Not on file   Social Determinants of Health   Financial Resource Strain: Not on file  Food Insecurity: Not on file  Transportation Needs: Not on file  Physical Activity: Not on file  Stress: Not on file  Social Connections: Not on file  Intimate Partner Violence: Not on file    Family History  Problem Relation Age of Onset   Healthy Mother    Healthy Father    Diabetes Paternal Grandfather    Breast cancer Neg Hx    Ovarian cancer Neg Hx    Colon cancer Neg Hx     The following portions of the patient's history were reviewed and updated as appropriate: allergies, current medications, past family history, past medical history, past social history, past surgical history and problem list.  Review of Systems Review of Systems - Negative except as mentioned in HPI Review of Systems - General ROS: negative for - chills, fatigue, fever, hot flashes, malaise or night sweats Hematological and Lymphatic ROS: negative for - bleeding problems or  swollen lymph nodes Gastrointestinal ROS: negative for - abdominal pain, blood in stools, change in bowel habits and nausea/vomiting Musculoskeletal ROS: negative for - joint pain, muscle pain or muscular weakness Genito-Urinary ROS: negative for - change in menstrual cycle, dysmenorrhea, dyspareunia, dysuria, genital discharge, genital ulcers, hematuria, incontinence, irregular/heavy menses, nocturia or pelvic pain. Positive for Labia bump.   Objective:   BP 136/87   Pulse 65   Wt 241 lb (109.3 kg)   LMP 12/07/2022 (Exact Date)   Breastfeeding Unknown   BMI 41.37 kg/m  CONSTITUTIONAL: Well-developed, well-nourished female in no acute distress.  HENT:  Normocephalic, atraumatic.  NECK: Normal range of motion, supple, no masses.  Normal thyroid.  SKIN: Skin is warm and dry.  No rash noted. Not diaphoretic. No erythema. No pallor. [place]: Alert and oriented to person, place, and time. PSYCHIATRIC: Normal mood and affect. Normal behavior. Normal judgment and thought content. CARDIOVASCULAR:Not Examined RESPIRATORY: Not Examined BREASTS: Not Examined ABDOMEN: Soft, non distended; Non tender.  No Organomegaly. PELVIC:  External Genitalia: Normal  BUS: Normal  Vagina: Normal. Peas size, sebaceous cyst left labia that was evacuated using moderate pressure to the cyst. White thick yellow exudate removed. Pt tolerated well.  MUSCULOSKELETAL: Normal range of motion. No tenderness.  No cyanosis, clubbing, or edema.     Assessment:   Labial sebaceous cyst    Plan:    Peas size, sebaceous cyst left labia that was evacuated using moderate pressure to the cyst. White thick yellow exudate removed. Pt tolerated well.  Pt instructed to keep area clean and dry. Tylenol if need for discomfort. Follow up prn.   Philip Aspen, CNM

## 2022-12-25 IMAGING — US US OB < 14 WEEKS - US OB TV
1 series · 13 of 28 positions shown · non-contrast
Comparison: None.

CLINICAL DATA: Pregnant, LMP 12/14/2020

EXAM:
OBSTETRIC <14 WK US AND TRANSVAGINAL OB US
TECHNIQUE: Both transabdominal and transvaginal ultrasound examinations were
performed for complete evaluation of the gestation as well as the
maternal uterus, adnexal regions, and pelvic cul-de-sac.
Transvaginal technique was performed to assess early pregnancy.

[Series 1: us ob < 14 weeks - us ob tv · 0.31mm/px · 13 of 86 slices shown]
[im 4/86]
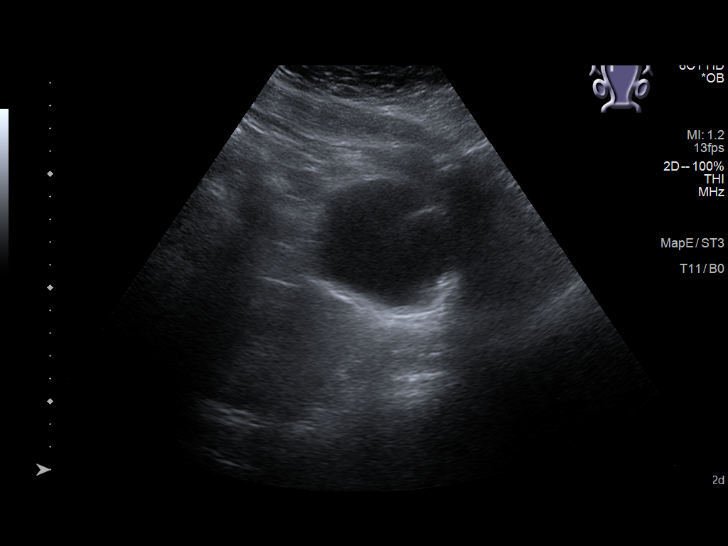
[im 10/86]
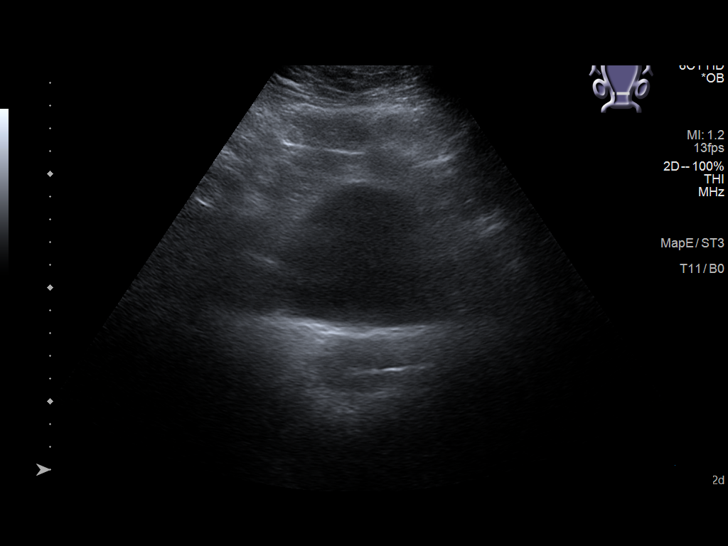
[im 16/86]
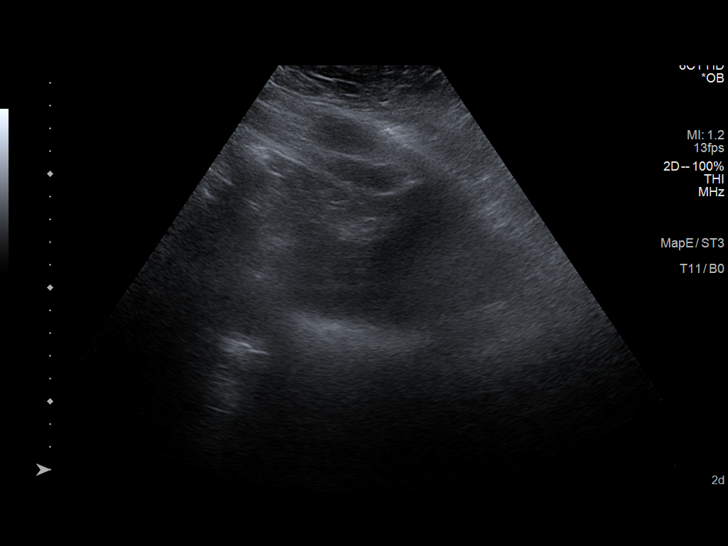
[im 23/86]
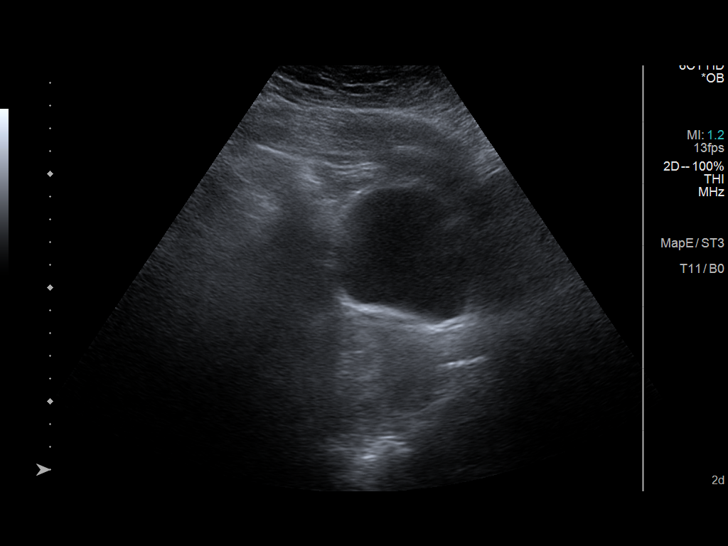
[im 29/86]
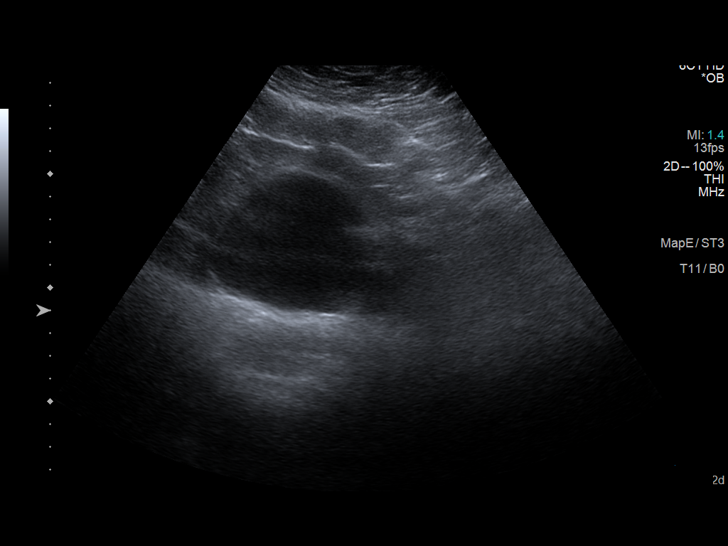
[im 35/86]
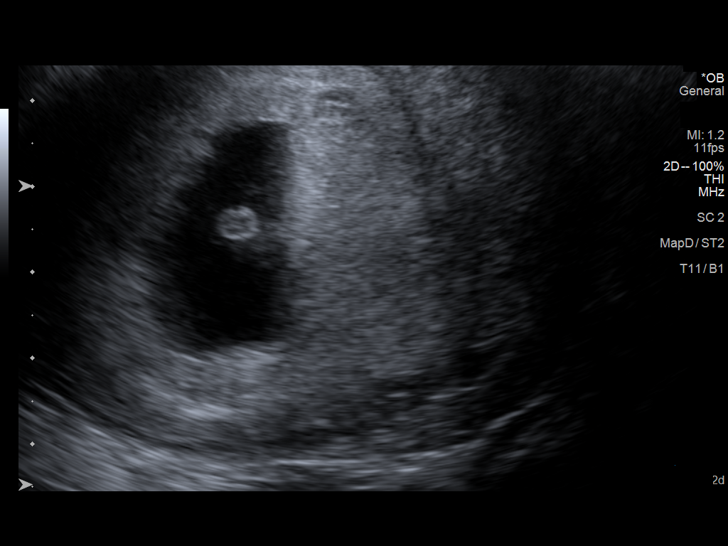
[im 45/86]
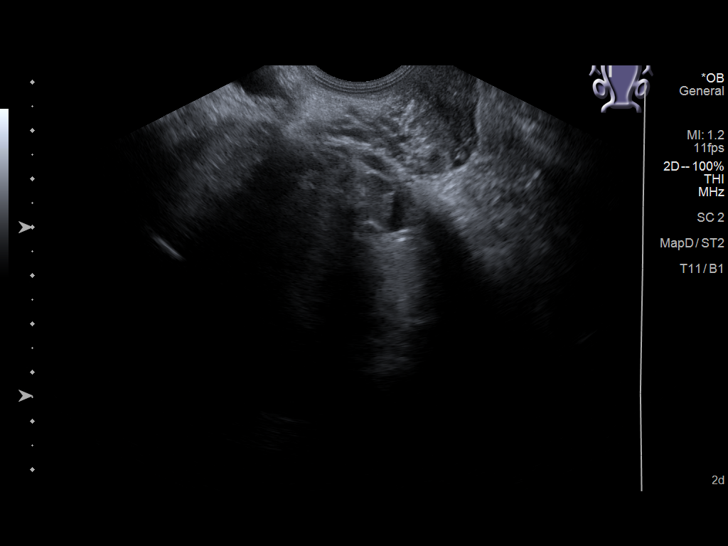
[im 51/86]
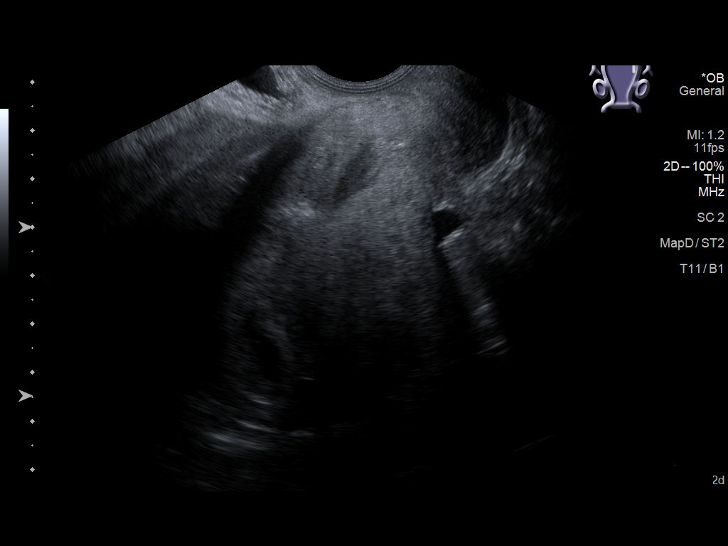
[im 57/86]
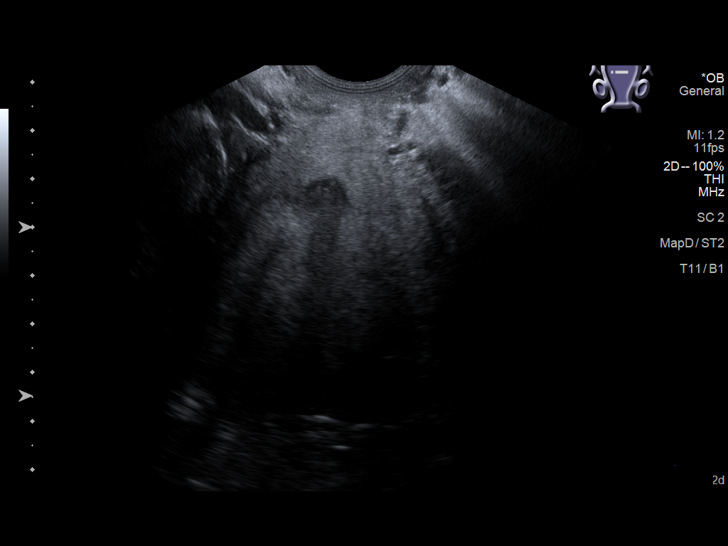
[im 63/86]
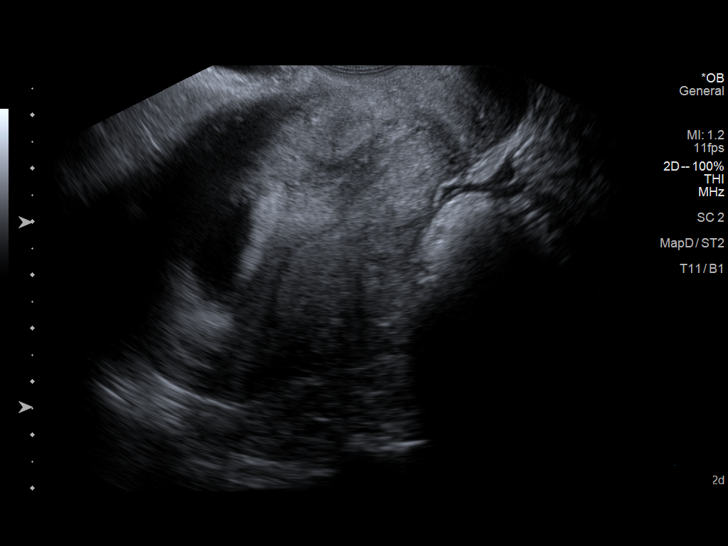
[im 70/86]
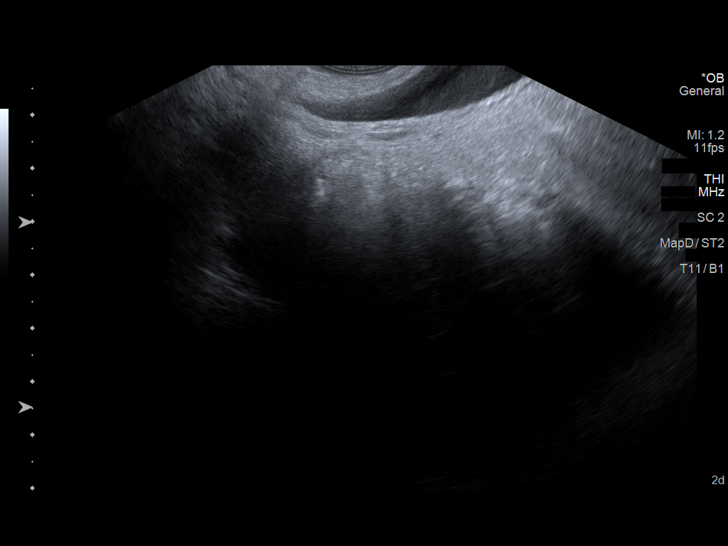
[im 76/86]
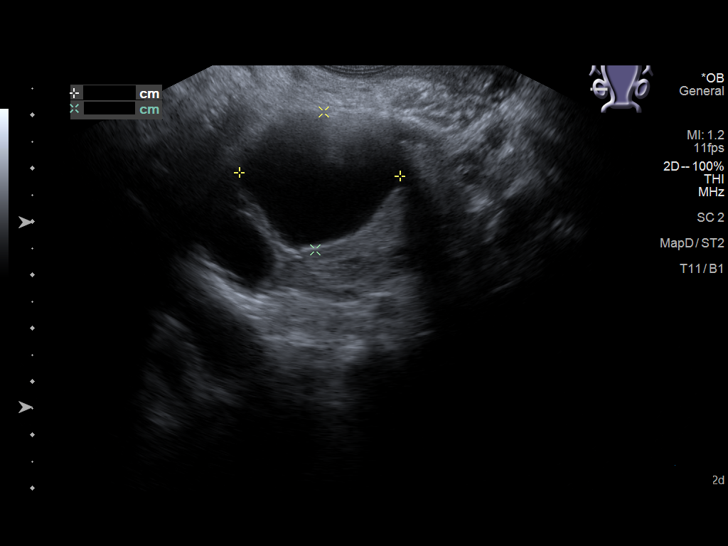
[im 82/86]
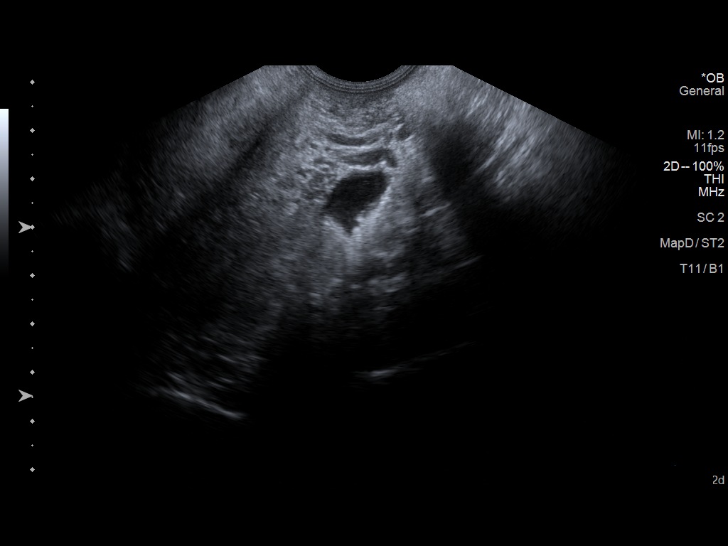

[13 of 28 positions shown; findings below may reference images not displayed]

FINDINGS: Intrauterine gestational sac: None

Yolk sac:  Visualized.

Embryo:  Visualized.

Cardiac Activity: Visualized.

Heart Rate: 113 bpm

MSD: Appropriate given fetal size

CRL:  8 mm   6 w   6 d                  US EDC: 09/30/2021

Subchorionic hemorrhage:  None visualized.

Maternal uterus/adnexae: The uterus is mildly retroflexed. The
cervix is closed and is unremarkable. No intrauterine masses are
seen. No free fluid within the pelvis. The maternal ovaries are
normal in size and echogenicity. 3 cm simple cyst is seen within the
right ovary.
IMPRESSION: Single living intrauterine gestation with an estimated gestational
age of 6 weeks, 6 days.

3 cm right ovarian simple cyst. No followup imaging recommended.
Note: This recommendation does not apply to premenarchal patients or
to those with increased risk (genetic, family history, elevated
tumor markers or other high-risk factors) of ovarian cancer.
Reference: Radiology [DATE]):[id number].

## 2023-01-24 ENCOUNTER — Ambulatory Visit (INDEPENDENT_AMBULATORY_CARE_PROVIDER_SITE_OTHER): Payer: 59

## 2023-01-24 VITALS — BP 124/70 | Ht 64.0 in | Wt 241.0 lb

## 2023-01-24 DIAGNOSIS — Z3201 Encounter for pregnancy test, result positive: Secondary | ICD-10-CM

## 2023-01-24 DIAGNOSIS — N912 Amenorrhea, unspecified: Secondary | ICD-10-CM

## 2023-01-24 LAB — POCT URINE PREGNANCY: Preg Test, Ur: POSITIVE — AB

## 2023-01-24 NOTE — Progress Notes (Signed)
    NURSE VISIT NOTE  Subjective:    Patient ID: Valerie Garner, female    DOB: 1996/09/02, 27 y.o.   MRN: [id number]  HPI  Patient is a 27 y.o. [id number] female who presents for evaluation of amenorrhea. She believes she could be pregnant. Pregnancy is desired. Sexual Activity: has sex with males. Current symptoms also include: positive home pregnancy test. Last period was normal.    Objective:    BP 124/70   Ht 5' 4"$  (1.626 m)   Wt 241 lb (109.3 kg)   LMP 12/07/2022 (Exact Date)   Breastfeeding No   BMI 41.37 kg/m   Lab Review  Results for orders placed or performed in visit on 01/24/23  POCT urine pregnancy  Result Value Ref Range   Preg Test, Ur Positive (A) Negative    Assessment:   1. Amenorrhea     Plan:   Pregnancy Test: Positive  Estimated Date of Delivery: 09/13/23 Encouraged well-balanced diet, plenty of rest when needed, pre-natal vitamins daily and walking for exercise.  Discussed self-help for nausea, avoiding OTC medications until consulting provider or pharmacist, other than Tylenol as needed, minimal caffeine (1-2 cups daily) and avoiding alcohol.   She will schedule her nurse visit @ [redacted] wks pregnant, u/s for dating and labs @10$  wk, and NOB visit at [redacted] wk pregnant. Feel free to call with any questions.    Drenda Freeze, CMA

## 2023-01-24 NOTE — Patient Instructions (Signed)
Commonly Asked Questions During Pregnancy  Cats: A parasite can be excreted in cat feces.  To avoid exposure you need to have another person empty the little box.  If you must empty the litter box you will need to wear gloves.  Wash your hands after handling your cat.  This parasite can also be found in raw or undercooked meat so this should also be avoided.  Colds, Sore Throats, Flu: Please check your medication sheet to see what you can take for symptoms.  If your symptoms are unrelieved by these medications please call the office.  Dental Work: Most any dental work your dentist recommends is permitted.  X-rays should only be taken during the first trimester if absolutely necessary.  Your abdomen should be shielded with a lead apron during all x-rays.  Please notify your provider prior to receiving any x-rays.  Novocaine is fine; gas is not recommended.  If your dentist requires a note from us prior to dental work please call the office and we will provide one for you.  Exercise: Exercise is an important part of staying healthy during your pregnancy.  You may continue most exercises you were accustomed to prior to pregnancy.  Later in your pregnancy you will most likely notice you have difficulty with activities requiring balance like riding a bicycle.  It is important that you listen to your body and avoid activities that put you at a higher risk of falling.  Adequate rest and staying well hydrated are a must!  If you have questions about the safety of specific activities ask your provider.    Exposure to Children with illness: Try to avoid obvious exposure; report any symptoms to us when noted,  If you have chicken pos, red measles or mumps, you should be immune to these diseases.   Please do not take any vaccines while pregnant unless you have checked with your OB provider.  Fetal Movement: After 28 weeks we recommend you do "kick counts" twice daily.  Lie or sit down in a calm quiet environment and  count your baby movements "kicks".  You should feel your baby at least 10 times per hour.  If you have not felt 10 kicks within the first hour get up, walk around and have something sweet to eat or drink then repeat for an additional hour.  If count remains less than 10 per hour notify your provider.  Fumigating: Follow your pest control agent's advice as to how long to stay out of your home.  Ventilate the area well before re-entering.  Hemorrhoids:   Most over-the-counter preparations can be used during pregnancy.  Check your medication to see what is safe to use.  It is important to use a stool softener or fiber in your diet and to drink lots of liquids.  If hemorrhoids seem to be getting worse please call the office.   Hot Tubs:  Hot tubs Jacuzzis and saunas are not recommended while pregnant.  These increase your internal body temperature and should be avoided.  Intercourse:  Sexual intercourse is safe during pregnancy as long as you are comfortable, unless otherwise advised by your provider.  Spotting may occur after intercourse; report any bright red bleeding that is heavier than spotting.  Labor:  If you know that you are in labor, please go to the hospital.  If you are unsure, please call the office and let us help you decide what to do.  Lifting, straining, etc:  If your job requires heavy   lifting or straining please check with your provider for any limitations.  Generally, you should not lift items heavier than that you can lift simply with your hands and arms (no back muscles)  Painting:  Paint fumes do not harm your pregnancy, but may make you ill and should be avoided if possible.  Latex or water based paints have less odor than oils.  Use adequate ventilation while painting.  Permanents & Hair Color:  Chemicals in hair dyes are not recommended as they cause increase hair dryness which can increase hair loss during pregnancy.  " Highlighting" and permanents are allowed.  Dye may be  absorbed differently and permanents may not hold as well during pregnancy.  Sunbathing:  Use a sunscreen, as skin burns easily during pregnancy.  Drink plenty of fluids; avoid over heating.  Tanning Beds:  Because their possible side effects are still unknown, tanning beds are not recommended.  Ultrasound Scans:  Routine ultrasounds are performed at approximately 20 weeks.  You will be able to see your baby's general anatomy an if you would like to know the gender this can usually be determined as well.  If it is questionable when you conceived you may also receive an ultrasound early in your pregnancy for dating purposes.  Otherwise ultrasound exams are not routinely performed unless there is a medical necessity.  Although you can request a scan we ask that you pay for it when conducted because insurance does not cover " patient request" scans.  Work: If your pregnancy proceeds without complications you may work until your due date, unless your physician or employer advises otherwise.  Round Ligament Pain/Pelvic Discomfort:  Sharp, shooting pains not associated with bleeding are fairly common, usually occurring in the second trimester of pregnancy.  They tend to be worse when standing up or when you remain standing for long periods of time.  These are the result of pressure of certain pelvic ligaments called "round ligaments".  Rest, Tylenol and heat seem to be the most effective relief.  As the womb and fetus grow, they rise out of the pelvis and the discomfort improves.  Please notify the office if your pain seems different than that described.  It may represent a more serious condition.  Common Medications Safe in Pregnancy  Acne:      Constipation:  Benzoyl Peroxide     Colace  Clindamycin      Dulcolax Suppository  Topica Erythromycin     Fibercon  Salicylic Acid      Metamucil         Miralax AVOID:        Senakot   Accutane    Cough:  Retin-A       Cough  Drops  Tetracycline      Phenergan w/ Codeine if Rx  Minocycline      Robitussin (Plain & DM)  Antibiotics:     Crabs/Lice:  Ceclor       RID  Cephalosporins    AVOID:  E-Mycins      Kwell  Keflex  Macrobid/Macrodantin   Diarrhea:  Penicillin      Kao-Pectate  Zithromax      Imodium AD         PUSH FLUIDS AVOID:       Cipro     Fever:  Tetracycline      Tylenol (Regular or Extra  Minocycline       Strength)  Levaquin      Extra Strength-Do not            Exceed 8 tabs/24 hrs Caffeine:        <200mg/day (equiv. To 1 cup of coffee or  approx. 3 12 oz sodas)         Gas: Cold/Hayfever:       Gas-X  Benadryl      Mylicon  Claritin       Phazyme  **Claritin-D        Chlor-Trimeton    Headaches:  Dimetapp      ASA-Free Excedrin  Drixoral-Non-Drowsy     Cold Compress  Mucinex (Guaifenasin)     Tylenol (Regular or Extra  Sudafed/Sudafed-12 Hour     Strength)  **Sudafed PE Pseudoephedrine   Tylenol Cold & Sinus     Vicks Vapor Rub  Zyrtec  **AVOID if Problems With Blood Pressure         Heartburn: Avoid lying down for at least 1 hour after meals  Aciphex      Maalox     Rash:  Milk of Magnesia     Benadryl    Mylanta       1% Hydrocortisone Cream  Pepcid  Pepcid Complete   Sleep Aids:  Prevacid      Ambien   Prilosec       Benadryl  Rolaids       Chamomile Tea  Tums (Limit 4/day)     Unisom         Tylenol PM         Warm milk-add vanilla or  Hemorrhoids:       Sugar for taste  Anusol/Anusol H.C.  (RX: Analapram 2.5%)  Sugar Substitutes:  Hydrocortisone OTC     Ok in moderation  Preparation H      Tucks        Vaseline lotion applied to tissue with wiping    Herpes:     Throat:  Acyclovir      Oragel  Famvir  Valtrex     Vaccines:         Flu Shot Leg Cramps:       *Gardasil  Benadryl      Hepatitis A         Hepatitis B Nasal Spray:       Pneumovax  Saline Nasal Spray     Polio Booster         Tetanus Nausea:       Tuberculosis test or PPD  Vitamin  B6 25 mg TID   AVOID:    Dramamine      *Gardasil  Emetrol       Live Poliovirus  Ginger Root 250 mg QID    MMR (measles, mumps &  High Complex Carbs @ Bedtime    rebella)  Sea Bands-Accupressure    Varicella (Chickenpox)  Unisom 1/2 tab TID     *No known complications           If received before Pain:         Known pregnancy;   Darvocet       Resume series after  Lortab        Delivery  Percocet    Yeast:   Tramadol      Femstat  Tylenol 3      Gyne-lotrimin  Ultram       Monistat  Vicodin           MISC:         All Sunscreens             Hair Coloring/highlights          Insect Repellant's          (Including DEET)         Mystic Tans   First Trimester of Pregnancy  The first trimester of pregnancy starts on the first day of your last menstrual period until the end of week 12. This is months 1 through 3 of pregnancy. A week after a sperm fertilizes an egg, the egg will implant into the wall of the uterus and begin to develop into a baby. By the end of 12 weeks, all the baby's organs will be formed and the baby will be 2-3 inches in size. Body changes during your first trimester Your body goes through many changes during pregnancy. The changes vary and generally return to normal after your baby is born. Physical changes You may gain or lose weight. Your breasts may begin to grow larger and become tender. The tissue that surrounds your nipples (areola) may become darker. Dark spots or blotches (chloasma or mask of pregnancy) may develop on your face. You may have changes in your hair. These can include thickening or thinning of your hair or changes in texture. Health changes You may feel nauseous, and you may vomit. You may have heartburn. You may develop headaches. You may develop constipation. Your gums may bleed and may be sensitive to brushing and flossing. Other changes You may tire easily. You may urinate more often. Your menstrual periods will stop. You may have a  loss of appetite. You may develop cravings for certain kinds of food. You may have changes in your emotions from day to day. You may have more vivid and strange dreams. Follow these instructions at home: Medicines Follow your health care provider's instructions regarding medicine use. Specific medicines may be either safe or unsafe to take during pregnancy. Do not take any medicines unless told to by your health care provider. Take a prenatal vitamin that contains at least 600 micrograms (mcg) of folic acid. Eating and drinking Eat a healthy diet that includes fresh fruits and vegetables, whole grains, good sources of protein such as meat, eggs, or tofu, and low-fat dairy products. Avoid raw meat and unpasteurized juice, milk, and cheese. These carry germs that can harm you and your baby. If you feel nauseous or you vomit: Eat 4 or 5 small meals a day instead of 3 large meals. Try eating a few soda crackers. Drink liquids between meals instead of during meals. You may need to take these actions to prevent or treat constipation: Drink enough fluid to keep your urine pale yellow. Eat foods that are high in fiber, such as beans, whole grains, and fresh fruits and vegetables. Limit foods that are high in fat and processed sugars, such as fried or sweet foods. Activity Exercise only as directed by your health care provider. Most people can continue their usual exercise routine during pregnancy. Try to exercise for 30 minutes at least 5 days a week. Stop exercising if you develop pain or cramping in the lower abdomen or lower back. Avoid exercising if it is very hot or humid or if you are at high altitude. Avoid heavy lifting. If you choose to, you may have sex unless your health care provider tells you not to. Relieving pain and discomfort Wear a good support bra to relieve breast tenderness. Rest with your legs elevated if you have leg cramps or low back pain. If you develop bulging veins  (varicose veins)   in your legs: Wear support hose as told by your health care provider. Elevate your feet for 15 minutes, 3-4 times a day. Limit salt in your diet. Safety Wear your seat belt at all times when driving or riding in a car. Talk with your health care provider if someone is verbally or physically abusive to you. Talk with your health care provider if you are feeling sad or have thoughts of hurting yourself. Lifestyle Do not use hot tubs, steam rooms, or saunas. Do not douche. Do not use tampons or scented sanitary pads. Do not use herbal remedies, alcohol, illegal drugs, or medicines that are not approved by your health care provider. Chemicals in these products can harm your baby. Do not use any products that contain nicotine or tobacco, such as cigarettes, e-cigarettes, and chewing tobacco. If you need help quitting, ask your health care provider. Avoid cat litter boxes and soil used by cats. These carry germs that can cause birth defects in the baby and possibly loss of the unborn baby (fetus) by miscarriage or stillbirth. General instructions During routine prenatal visits in the first trimester, your health care provider will do a physical exam, perform necessary tests, and ask you how things are going. Keep all follow-up visits. This is important. Ask for help if you have counseling or nutritional needs during pregnancy. Your health care provider can offer advice or refer you to specialists for help with various needs. Schedule a dentist appointment. At home, brush your teeth with a soft toothbrush. Floss gently. Write down your questions. Take them to your prenatal visits. Where to find more information American Pregnancy Association: americanpregnancy.org American College of Obstetricians and Gynecologists: acog.org/en/Womens%20Health/Pregnancy Office on Women's Health: womenshealth.gov/pregnancy Contact a health care provider if you have: Dizziness. A fever. Mild pelvic  cramps, pelvic pressure, or nagging pain in the abdominal area. Nausea, vomiting, or diarrhea that lasts for 24 hours or longer. A bad-smelling vaginal discharge. Pain when you urinate. Known exposure to a contagious illness, such as chickenpox, measles, Zika virus, HIV, or hepatitis. Get help right away if you have: Spotting or bleeding from your vagina. Severe abdominal cramping or pain. Shortness of breath or chest pain. Any kind of trauma, such as from a fall or a car crash. New or increased pain, swelling, or redness in an arm or leg. Summary The first trimester of pregnancy starts on the first day of your last menstrual period until the end of week 12 (months 1 through 3). Eating 4 or 5 small meals a day rather than 3 large meals may help to relieve nausea and vomiting. Do not use any products that contain nicotine or tobacco, such as cigarettes, e-cigarettes, and chewing tobacco. If you need help quitting, ask your health care provider. Keep all follow-up visits. This is important. This information is not intended to replace advice given to you by your health care provider. Make sure you discuss any questions you have with your health care provider. Document Revised: 04/29/2020 Document Reviewed: 03/05/2020 Elsevier Patient Education  2023 [employer]   

## 2023-01-31 ENCOUNTER — Encounter: Payer: Self-pay | Admitting: Advanced Practice Midwife

## 2023-01-31 NOTE — Telephone Encounter (Signed)
Spoke with patient reviewed bleeding protocol patient understood and verbalized understanding.

## 2023-02-03 ENCOUNTER — Ambulatory Visit (INDEPENDENT_AMBULATORY_CARE_PROVIDER_SITE_OTHER): Payer: 59

## 2023-02-03 DIAGNOSIS — Z3480 Encounter for supervision of other normal pregnancy, unspecified trimester: Secondary | ICD-10-CM | POA: Insufficient documentation

## 2023-02-03 DIAGNOSIS — Z1379 Encounter for other screening for genetic and chromosomal anomalies: Secondary | ICD-10-CM

## 2023-02-03 DIAGNOSIS — Z113 Encounter for screening for infections with a predominantly sexual mode of transmission: Secondary | ICD-10-CM

## 2023-02-03 DIAGNOSIS — Z3687 Encounter for antenatal screening for uncertain dates: Secondary | ICD-10-CM

## 2023-02-03 DIAGNOSIS — Z3689 Encounter for other specified antenatal screening: Secondary | ICD-10-CM

## 2023-02-03 NOTE — Progress Notes (Signed)
New OB Intake  I connected with  Valerie Garner on 02/03/23 at 11:15 AM EST by telephone and verified that I am speaking with the correct person using two identifiers. Nurse is located at [employer] and pt is located at home.  I explained I am completing New OB Intake today. We discussed her EDD of 09/13/23 that is based on LMP of 12/07/22. Pt is G3/P2002. I reviewed her allergies, medications, Medical/Surgical/OB history, and appropriate screenings. There are no cats in the home. Based on history, this is a/an pregnancy uncomplicated .   Patient Active Problem List   Diagnosis Date Noted   Type A blood, Rh positive 03/03/2021    Concerns addressed today: None.  Delivery Plans:  Plans to deliver at [facility].  Anatomy US Explained first scheduled [place] will be on 02/21/23 and Anatomy US will be done at 20 weeks.  Labs Discussed genetic screening with patient. Patient desires genetic testing to be drawn with new OB labs. Discussed possible labs to be drawn at new OB appointment.  COVID Vaccine Patient has not had COVID vaccine.   Social Determinants of Health Food Insecurity: denies food insecurity  Transportation: Patient denies transportation needs.  First visit review I reviewed new OB appt with pt. I explained she will have bloodwork and pap smear/pelvic exam if indicated. Explained pt will be seen by Rod Can, CNM at first visit; encounter routed to appropriate provider.   Drenda Freeze, [facility] 02/03/2023  11:20 AM

## 2023-02-09 ENCOUNTER — Encounter: Payer: Self-pay | Admitting: Advanced Practice Midwife

## 2023-02-09 DIAGNOSIS — O219 Vomiting of pregnancy, unspecified: Secondary | ICD-10-CM

## 2023-02-09 MED ORDER — ONDANSETRON HCL 4 MG PO TABS: 4.0000 mg | ORAL_TABLET | Freq: Three times a day (TID) | ORAL | 0 refills | Status: DC | PRN

## 2023-02-21 ENCOUNTER — Ambulatory Visit
Admission: RE | Admit: 2023-02-21 | Discharge: 2023-02-21 | Disposition: A | Discharge status: Home / Self Care | Payer: 59 | Source: Ambulatory Visit | Attending: Advanced Practice Midwife | Admitting: Advanced Practice Midwife

## 2023-02-21 ENCOUNTER — Other Ambulatory Visit: Payer: 59

## 2023-02-21 DIAGNOSIS — Z3A09 9 weeks gestation of pregnancy: Secondary | ICD-10-CM | POA: Insufficient documentation

## 2023-02-21 DIAGNOSIS — Z3687 Encounter for antenatal screening for uncertain dates: Secondary | ICD-10-CM | POA: Insufficient documentation

## 2023-02-21 DIAGNOSIS — Z3480 Encounter for supervision of other normal pregnancy, unspecified trimester: Secondary | ICD-10-CM

## 2023-02-21 DIAGNOSIS — Z3689 Encounter for other specified antenatal screening: Secondary | ICD-10-CM | POA: Diagnosis not present

## 2023-02-22 ENCOUNTER — Encounter: Payer: Self-pay | Admitting: Advanced Practice Midwife

## 2023-02-28 ENCOUNTER — Ambulatory Visit (INDEPENDENT_AMBULATORY_CARE_PROVIDER_SITE_OTHER): Payer: 59 | Admitting: Advanced Practice Midwife

## 2023-02-28 ENCOUNTER — Other Ambulatory Visit: Payer: 59

## 2023-02-28 ENCOUNTER — Encounter: Payer: Self-pay | Admitting: Advanced Practice Midwife

## 2023-02-28 ENCOUNTER — Other Ambulatory Visit (HOSPITAL_COMMUNITY)
Admission: RE | Admit: 2023-02-28 | Discharge: 2023-02-28 | Disposition: A | Discharge status: Home / Self Care | Payer: 59 | Source: Ambulatory Visit | Attending: Obstetrics | Admitting: Obstetrics

## 2023-02-28 VITALS — BP 123/66 | HR 77 | Wt 237.0 lb

## 2023-02-28 DIAGNOSIS — Z113 Encounter for screening for infections with a predominantly sexual mode of transmission: Secondary | ICD-10-CM

## 2023-02-28 DIAGNOSIS — Z3481 Encounter for supervision of other normal pregnancy, first trimester: Secondary | ICD-10-CM

## 2023-02-28 DIAGNOSIS — Z3480 Encounter for supervision of other normal pregnancy, unspecified trimester: Secondary | ICD-10-CM | POA: Insufficient documentation

## 2023-02-28 DIAGNOSIS — Z3A10 10 weeks gestation of pregnancy: Secondary | ICD-10-CM

## 2023-02-28 DIAGNOSIS — O219 Vomiting of pregnancy, unspecified: Secondary | ICD-10-CM

## 2023-02-28 DIAGNOSIS — Z1379 Encounter for other screening for genetic and chromosomal anomalies: Secondary | ICD-10-CM | POA: Diagnosis not present

## 2023-02-28 LAB — POCT URINALYSIS DIPSTICK OB
Bilirubin, UA: NEGATIVE
Blood, UA: NEGATIVE
Glucose, UA: NEGATIVE
Ketones, UA: NEGATIVE
Leukocytes, UA: NEGATIVE
Nitrite, UA: NEGATIVE
POC,PROTEIN,UA: NEGATIVE
Spec Grav, UA: 1.02 (ref 1.010–1.025)
Urobilinogen, UA: 1 E.U./dL
pH, UA: 5.5 (ref 5.0–8.0)

## 2023-02-28 MED ORDER — ONDANSETRON HCL 4 MG PO TABS: 4.0000 mg | ORAL_TABLET | Freq: Three times a day (TID) | ORAL | 1 refills | Status: DC | PRN

## 2023-02-28 NOTE — Patient Instructions (Signed)
Prenatal Care Prenatal care is health care during pregnancy. It helps you and your unborn baby (fetus) stay as healthy as possible. Prenatal care may be provided by a midwife, a family practice doctor, a mid-level practitioner (nurse practitioner or physician assistant), or a childbirth and pregnancy doctor (obstetrician). How does this affect me? During pregnancy, you will be closely monitored for any new conditions that might develop. To lower your risk of pregnancy complications, you and your health care provider will talk about any underlying conditions you have. How does this affect my baby? Early and consistent prenatal care increases the chance that your baby will be healthy during pregnancy. Prenatal care lowers the risk that your baby will be: Born early (prematurely). Smaller than expected at birth (small for gestational age). What can I expect at the first prenatal care visit? Your first prenatal care visit will likely be the longest. You should schedule your first prenatal care visit as soon as you know that you are pregnant. Your first visit is a good time to talk about any questions or concerns you have about pregnancy. Medical history At your visit, you and your health care provider will talk about your medical history, including: Any past pregnancies. Your family's medical history. Medical history of the baby's father. Any long-term (chronic) health conditions you have and how you manage them. Any surgeries or procedures you have had. Any current over-the-counter or prescription medicines, herbs, or supplements that you are taking. Other factors that could pose a risk to your baby, including: Exposure to harmful chemicals or radiation at work or at home. Any substance use, including tobacco, alcohol, and drug use. Your home setting and your stress levels, including: Exposure to abuse or violence. Household financial strain. Your daily health habits, including diet and  exercise. Tests and screenings Your health care provider will: Measure your weight, height, and blood pressure. Do a physical exam, including a pelvic and breast exam. Perform blood tests and urine tests to check for: Urinary tract infection. Sexually transmitted infections (STIs). Low iron levels in your blood (anemia). Blood type and certain proteins on red blood cells (Rh antibodies). Infections and immunity to viruses, such as hepatitis B and rubella. HIV (human immunodeficiency virus). Discuss your options for genetic screening. Tips about staying healthy Your health care provider will also give you information about how to keep yourself and your baby healthy, including: Nutrition and taking vitamins. Physical activity. How to manage pregnancy symptoms such as nausea and vomiting (morning sickness). Infections and substances that may be harmful to your baby and how to avoid them. Food safety. Dental care. Working. Travel. Warning signs to watch for and when to call your health care provider. How often will I have prenatal care visits? After your first prenatal care visit, you will have regular visits throughout your pregnancy. The visit schedule is often as follows: Up to week 28 of pregnancy: once every 4 weeks. 28-36 weeks: once every 2 weeks. After 36 weeks: every week until delivery. Some women may have visits more or less often depending on any underlying health conditions and the health of the baby. Keep all follow-up and prenatal care visits. This is important. What happens during routine prenatal care visits? Your health care provider will: Measure your weight and blood pressure. Check for fetal heart sounds. Measure the height of your uterus in your abdomen (fundal height). This may be measured starting around week 20 of pregnancy. Check the position of your baby inside your uterus. Ask questions   about your diet, sleeping patterns, and whether you can feel the baby  move. Review warning signs to watch for and signs of labor. Ask about any pregnancy symptoms you are having and how you are dealing with them. Symptoms may include: Headaches. Nausea and vomiting. Vaginal discharge. Swelling. Fatigue. Constipation. Changes in your vision. Feeling persistently sad or anxious. Any discomfort, including back or pelvic pain. Bleeding or spotting. Make a list of questions to ask your health care provider at your routine visits. What tests might I have during prenatal care visits? You may have blood, urine, and imaging tests throughout your pregnancy, such as: Urine tests to check for glucose, protein, or signs of infection. Glucose tests to check for a form of diabetes that can develop during pregnancy (gestational diabetes mellitus). This is usually done around week 24 of pregnancy. Ultrasounds to check your baby's growth and development, to check for birth defects, and to check your baby's well-being. These can also help to decide when you should deliver your baby. A test to check for group B strep (GBS) infection. This is usually done around week 36 of pregnancy. Genetic testing. This may include blood, fluid, or tissue sampling, or imaging tests, such as an ultrasound. Some genetic tests are done during the first trimester and some are done during the second trimester. What else can I expect during prenatal care visits? Your health care provider may recommend getting certain vaccines during pregnancy. These may include: A yearly flu shot (annual influenza vaccine). This is especially important if you will be pregnant during flu season. Tdap (tetanus, diphtheria, pertussis) vaccine. Getting this vaccine during pregnancy can protect your baby from whooping cough (pertussis) after birth. This vaccine may be recommended between weeks 27 and 36 of pregnancy. A COVID-19 vaccine. Later in your pregnancy, your health care provider may give you information  about: Childbirth and breastfeeding classes. Choosing a health care provider for your baby. Umbilical cord banking. Breastfeeding. Birth control after your baby is born. The hospital labor and delivery unit and how to set up a tour. Registering at the hospital before you go into labor. Where to find more information Office on Women's Health: womenshealth.gov American Pregnancy Association: americanpregnancy.org March of Dimes: marchofdimes.org Summary Prenatal care helps you and your baby stay as healthy as possible during pregnancy. Your first prenatal care visit will most likely be the longest. You will have visits and tests throughout your pregnancy to monitor your health and your baby's health. Bring a list of questions to your visits to ask your health care provider. Make sure to keep all follow-up and prenatal care visits. This information is not intended to replace advice given to you by your health care provider. Make sure you discuss any questions you have with your health care provider. Document Revised: 09/03/2020 Document Reviewed: 09/03/2020 Else[employer] Patient Education  2023 [employer] Exercise During Pregnancy Exercise is an important part of being healthy for people of all ages. Exercise improves the function of your heart and lungs and helps you maintain strength, flexibility, and a healthy body weight. Exercise also boosts energy levels and elevates mood. Most women should exercise regularly during pregnancy. Exercise routines may need to change as your pregnancy progresses. In rare cases, women with certain medical conditions or complications may be asked to limit or avoid exercise during pregnancy. Your health care provider will give you information on what will work for you. How does this affect me? Along with maintaining general strength and flexibility, exercising during   pregnancy can help: Keep strength in muscles that are used during labor and  childbirth. Decrease low back pain or symptoms of depression. Control weight gain during pregnancy. Reduce the risk of needing insulin if you develop diabetes during pregnancy. Decrease the risk of cesarean delivery. Speed up your recovery after giving birth. Relieve constipation. How does this affect my baby? Exercise can help you have a healthy pregnancy. Exercise does not cause early (premature) birth. It will not cause your baby to weigh less at birth. What exercises can I do? Many exercises are safe for you to do during pregnancy. Do a variety of exercises that safely increase your heart and breathing rates and help you build and maintain muscle strength. Do exercises exactly as told by your health care provider. You may do these exercises: Walking. Swimming. Water aerobics. Riding a stationary bike. Modified yoga or Pilates. Tell your instructor that you are pregnant. Avoid overstretching, and avoid lying on your back for long periods of time. Running or jogging. Choose this type of exercise only if: You ran or jogged regularly before your pregnancy. You can run or jog and still talk in complete sentences. What exercises should I avoid? You may be told to limit high-intensity exercise depending on your level of fitness and whether you exercised regularly before you were pregnant. You can tell that you are exercising at a high intensity if you are breathing much harder and faster and cannot hold a conversation while exercising. You must avoid: Contact sports. Activities that put you at risk for falling on or being hit in the belly, such as downhill skiing, waterskiing, surfing, rock climbing, cycling, gymnastics, and horseback riding. Scuba diving. Skydiving. Hot yoga or hot Pilates. These activities take place in a room that is heated to high temperatures. Jogging or running, unless you jogged or ran regularly before you were pregnant. While jogging or running, you should always be  able to talk in full sentences. Do not run or jog so fast that you are unable to have a conversation. Do not exercise at more than 6,000 feet above sea level (high elevation) if you are not used to exercising at high elevation. How do I exercise in a safe way?  Avoid overheating. Do not exercise in very high temperatures. Wear loose-fitting, breathable clothes. Avoid dehydration. Drink enough fluid before, during, and after exercise to keep your urine pale yellow. Avoid overstretching. Because of hormone changes during pregnancy, it is easy to overstretch muscles, tendons, and ligaments. Start slowly and ask your health care provider to recommend the types of exercise that are safe for you. Do not exercise to lose weight. Wear a sports bra to support your breasts. Avoid standing still or lying flat on your back as much as you can. Follow these instructions at home: Exercise on most days or all days of the week. Try to exercise for 30 minutes a day, 5 days a week, unless your health care provider tells you not to. If you actively exercised before your pregnancy and you are healthy, your health care provider may tell you to continue to do moderate-intensity to high-intensity exercise. If you are just starting to exercise or did not exercise much before your pregnancy, your health care provider may tell you to do low-intensity to moderate-intensity exercise. Questions to ask your health care provider Is exercise safe for me? What are signs that I should stop exercising? Does my health condition mean that I should not exercise during pregnancy? When should I   avoid exercising during pregnancy? Stop exercising and contact a health care provider if: You have any unusual symptoms such as: Mild contractions of the uterus or cramps in the abdomen. A dizzy feeling that does not go away when you rest. Stop exercising and get help right away if: You have any unusual symptoms such as: Sudden, severe  pain in your low back or your belly. Regular, painful contractions of your uterus. Chest pain. Bleeding or fluid leaking from your vagina. Shortness of breath. Headache. Pain and swelling of your calves. Summary Most women should exercise regularly throughout pregnancy. In rare cases, women with certain medical conditions or complications may be asked to limit or avoid exercise during pregnancy. Do not exercise to lose weight during pregnancy. Your health care provider will tell you what level of physical activity is right for you. Stop exercising and contact a health care provider if you have unusual symptoms, such as mild contractions or dizziness. This information is not intended to replace advice given to you by your health care provider. Make sure you discuss any questions you have with your health care provider. Document Revised: 07/08/2020 Document Reviewed: 07/08/2020 Else[employer] Patient Education  2023 [employer] Eating Plan for Pregnant Women While you are pregnant, your body requires additional nutrition to help support your growing baby. You also have a higher need for some vitamins and minerals, such as folic acid, calcium, iron, and vitamin D. Eating a healthy, well-balanced diet is very important for your health and your baby's health. Your need for extra calories varies over the course of your pregnancy. Pregnancy is divided into three trimesters, with each trimester lasting 3 months. For most women, it is recommended to consume: 150 extra calories a day during the first trimester. 300 extra calories a day during the second trimester. 300 extra calories a day during the third trimester. What are tips for following this plan? Cooking Practice good food safety and cleanliness. Wash your hands before you eat and after you prepare raw meat. Wash all fruits and vegetables well before peeling or eating. Taking these actions can help to prevent foodborne illnesses that can be very  dangerous to your baby, such as listeriosis. Ask your health care provider for more information about listeriosis. Make sure that all meats, poultry, and eggs are cooked to food-safe temperatures or "well-done." Meal planning  Eat a variety of foods (especially fruits and vegetables) to get a full range of vitamins and minerals. Two or more servings of fish are recommended each week in order to get the most benefits from omega-3 fatty acids that are found in seafood. Choose fish that are lower in mercury, such as salmon and pollock. Limit your overall intake of foods that have "empty calories." These are foods that have little nutritional value, such as sweets, desserts, candies, and sugar-sweetened beverages. Drinks that contain caffeine are okay to drink, but it is better to avoid caffeine. Keep your total caffeine intake to less than 200 mg each day (which is 12 oz or 355 mL of coffee, tea, or soda) or the limit as told by your health care provider. General information Do not try to lose weight or go on a diet during pregnancy. Take a prenatal vitamin to help meet your additional vitamin and mineral needs during pregnancy, specifically for folic acid, iron, calcium, and vitamin D. Remember to stay active. Ask your health care provider what types of exercise and activities are safe for you. What does 150 extra calories look like?   Healthy options that provide 150 extra calories each day could be any of the following: 6-8 oz (170-227 g) plain low-fat yogurt with  cup (70 g) berries. 1 apple with 2 tsp (11 g) peanut butter. Cut-up vegetables with  cup (60 g) hummus. 8 fl oz (237 mL) low-fat chocolate milk. 1 stick of string cheese with 1 medium orange. 1 peanut butter and jelly sandwich that is made with one slice of whole-wheat bread and 1 tsp (5 g) of peanut butter. For 300 extra calories, you could eat two of these healthy options each day. What is a healthy amount of weight to gain? The  right amount of weight gain for you is based on your BMI (body mass index) before you became pregnant. If your BMI was less than 18 (underweight), you should gain 28-40 lb (13-18 kg). If your BMI was 18-24.9 (normal), you should gain 25-35 lb (11-16 kg). If your BMI was 25-29.9 (overweight), you should gain 15-25 lb (7-11 kg). If your BMI was 30 or greater (obese), you should gain 11-20 lb (5-9 kg). What if I am having twins or multiples? Generally, if you are carrying twins or multiples: You may need to eat 300-600 extra calories a day. The recommended range for total weight gain is 25-54 lb (11-25 kg), depending on your BMI before pregnancy. Talk with your health care provider to find out about nutritional needs, weight gain, and exercise that is right for you. What foods should I eat?  Fruits All fruits. Eat a variety of colors and types of fruit. Remember to wash your fruits well before peeling or eating. Vegetables All vegetables. Eat a variety of colors and types of vegetables. Remember to wash your vegetables well before peeling or eating. Grains All grains. Choose whole grains, such as whole-wheat bread, oatmeal, or brown rice. Meats and other protein foods Lean meats, including chicken, turkey, and lean cuts of beef, veal, or pork. Fish that is higher in omega-3 fatty acids and lower in mercury, such as salmon, herring, mussels, trout, sardines, pollock, shrimp, crab, and lobster. Tofu. Tempeh. Beans. Eggs. Peanut butter and other nut butters. Dairy Pasteurized milk and milk alternatives, such as almond milk. Pasteurized yogurt and pasteurized cheese. Cottage cheese. Sour cream. Beverages Water. Juices that contain 100% fruit juice or vegetable juice. Caffeine-free teas and decaffeinated coffee. Fats and oils Fats and oils are okay to include in moderation. Sweets and desserts Sweets and desserts are okay to include in moderation. Seasoning and other foods All pasteurized  condiments. The items listed above may not be a complete list of foods and beverages you can eat. Contact a dietitian for more information. What foods should I avoid? Fruits Raw (unpasteurized) fruit juices. Vegetables Unpasteurized vegetable juices. Meats and other protein foods Precooked or cured meat, such as bologna, hot dogs, sausages, or meat loaves. (If you must eat those meats, reheat them until they are steaming hot.) Refrigerated pate, meat spreads from a meat counter, or smoked seafood that is found in the refrigerated section of a store. Raw or undercooked meats, poultry, and eggs. Raw fish, such as sushi or sashimi. Fish that have high mercury content, such as tilefish, shark, swordfish, and king mackerel. Dairy Unpasteurized milk and any foods that have unpasteurized milk in them. Soft cheeses, such as feta, queso blanco, queso fresco, Brie, Camembert, panela, and blue-veined cheeses (unless they are made with pasteurized milk, which must be stated on the label). Beverages Alcohol. Sugar-sweetened beverages, such as sodas, teas, or   energy drinks. Seasoning and other foods Homemade fermented foods and drinks, such as pickles, sauerkraut, or kombucha drinks. (Store-bought pasteurized versions of these are okay.) Salads that are made in a store or deli, such as ham salad, chicken salad, egg salad, tuna salad, and seafood salad. The items listed above may not be a complete list of foods and beverages you should avoid. Contact a dietitian for more information. Where to find more information To calculate the number of calories you need based on your height, weight, and activity level, you can use an online calculator such as: www.myplate.gov/myplate-plan To calculate how much weight you should gain during pregnancy, you can use an online pregnancy weight gain calculator such as: www.myplate.gov To learn more about eating fish during pregnancy, talk with your health care provider or  visit: www.fda.gov Summary While you are pregnant, your body requires additional nutrition to help support your growing baby. Eat a variety of foods, especially fruits and vegetables, to get a full range of vitamins and minerals. Practice good food safety and cleanliness. Wash your hands before you eat and after you prepare raw meat. Wash all fruits and vegetables well before peeling or eating. Taking these actions can help to prevent foodborne illnesses, such as listeriosis, that can be very dangerous to your baby. Do not eat raw meat or fish. Do not eat fish that have high mercury content, such as tilefish, shark, swordfish, and king mackerel. Do not eat raw (unpasteurized) dairy. Take a prenatal vitamin to help meet your additional vitamin and mineral needs during pregnancy, specifically for folic acid, iron, calcium, and vitamin D. This information is not intended to replace advice given to you by your health care provider. Make sure you discuss any questions you have with your health care provider. Document Revised: 06/18/2020 Document Reviewed: 06/18/2020 Elsevier Patient Education  2023 [employer]  

## 2023-03-01 LAB — MONITOR DRUG PROFILE 14(MW)
Amphetamine Scrn, Ur: NEGATIVE ng/mL
BARBITURATE SCREEN URINE: NEGATIVE ng/mL
BENZODIAZEPINE SCREEN, URINE: NEGATIVE ng/mL
Buprenorphine, Urine: NEGATIVE ng/mL
CANNABINOIDS UR QL SCN: NEGATIVE ng/mL
Cocaine (Metab) Scrn, Ur: NEGATIVE ng/mL
Creatinine(Crt), U: 90.7 mg/dL (ref 20.0–300.0)
Fentanyl, Urine: NEGATIVE pg/mL
Meperidine Screen, Urine: NEGATIVE ng/mL
Methadone Screen, Urine: NEGATIVE ng/mL
OXYCODONE+OXYMORPHONE UR QL SCN: NEGATIVE ng/mL
Opiate Scrn, Ur: NEGATIVE ng/mL
Ph of Urine: 6.1 (ref 4.5–8.9)
Phencyclidine Qn, Ur: NEGATIVE ng/mL
Propoxyphene Scrn, Ur: NEGATIVE ng/mL
SPECIFIC GRAVITY: 1.028
Tramadol Screen, Urine: NEGATIVE ng/mL

## 2023-03-01 LAB — URINALYSIS, ROUTINE W REFLEX MICROSCOPIC
Bilirubin, UA: NEGATIVE
Glucose, UA: NEGATIVE
Ketones, UA: NEGATIVE
Leukocytes,UA: NEGATIVE
Nitrite, UA: NEGATIVE
Protein,UA: NEGATIVE
RBC, UA: NEGATIVE
Specific Gravity, UA: 1.024 (ref 1.005–1.030)
Urobilinogen, Ur: 0.2 mg/dL (ref 0.2–1.0)
pH, UA: 6.5 (ref 5.0–7.5)

## 2023-03-01 LAB — NICOTINE SCREEN, URINE: Cotinine Ql Scrn, Ur: NEGATIVE ng/mL

## 2023-03-02 LAB — CBC/D/PLT+RPR+RH+ABO+RUBIGG...
Antibody Screen: NEGATIVE
Basophils Absolute: 0 10*3/uL (ref 0.0–0.2)
Basos: 0 %
EOS (ABSOLUTE): 0.3 10*3/uL (ref 0.0–0.4)
Eos: 3 %
HCV Ab: NONREACTIVE
HIV Screen 4th Generation wRfx: NONREACTIVE
Hematocrit: 36.4 % (ref 34.0–46.6)
Hemoglobin: 12.3 g/dL (ref 11.1–15.9)
Hepatitis B Surface Ag: NEGATIVE
Immature Grans (Abs): 0 10*3/uL (ref 0.0–0.1)
Immature Granulocytes: 0 %
Lymphocytes Absolute: 2.3 10*3/uL (ref 0.7–3.1)
Lymphs: 23 %
MCH: 29.8 pg (ref 26.6–33.0)
MCHC: 33.8 g/dL (ref 31.5–35.7)
MCV: 88 fL (ref 79–97)
Monocytes Absolute: 0.6 10*3/uL (ref 0.1–0.9)
Monocytes: 6 %
Neutrophils Absolute: 6.8 10*3/uL (ref 1.4–7.0)
Neutrophils: 68 %
Platelets: 363 10*3/uL (ref 150–450)
RBC: 4.13 x10E6/uL (ref 3.77–5.28)
RDW: 12.2 % (ref 11.7–15.4)
RPR Ser Ql: NONREACTIVE
Rh Factor: POSITIVE
Rubella Antibodies, IGG: 3.44 index (ref >0.99)
Varicella zoster IgG: 166 index (ref >165)
WBC: 10.1 10*3/uL (ref 3.4–10.8)

## 2023-03-02 LAB — URINE CULTURE, OB REFLEX

## 2023-03-02 LAB — URINE CYTOLOGY ANCILLARY ONLY
Chlamydia: NEGATIVE
Comment: NEGATIVE
Comment: NORMAL
Neisseria Gonorrhea: NEGATIVE

## 2023-03-02 LAB — CULTURE, OB URINE

## 2023-03-02 LAB — HCV INTERPRETATION

## 2023-03-02 NOTE — Progress Notes (Signed)
[place]  New Obstetric Patient H&P    Chief Complaint: "Desires prenatal care"   History of Present Illness: Patient is a 27 y.o. [id number] Not Hispanic or Latino female, presents with amenorrhea and positive home pregnancy test. Patient's last menstrual period was 12/07/2022 (exact date). and based on 10 week ultrasound, her EDD is Estimated Date of Delivery: 09/26/23 and her EGA is [redacted]w[redacted]d. Cycles are 5 days, regular, and occur approximately every : 33 days. Her last pap smear was 1 years ago and was no abnormalities.    She had a urine pregnancy test which was positive 5 or 6 week(s)  ago. Her last menstrual period was normal and lasted for 5 day(s). Since her LMP she claims she has experienced fatigue, nausea, occasional vomiting. She had spotting after her vaginal ultrasound. Her past medical history is noncontributory. Her prior pregnancies are notable for  FT SVD's, pelvis proven to 7#7oz.  Since her LMP, she admits to the use of tobacco products  no She claims she has lost  3  pounds since the start of her pregnancy.  There are cats in the home in the home  no  She admits close contact with children on a regular basis  yes  She has had chicken pox in the past no She has had Tuberculosis exposures, symptoms, or previously tested positive for TB   no Current or past history of domestic violence. no  Genetic Screening/Teratology Counseling: (Includes patient, baby's father, or anyone in either family with:)   90. Patient's age >/= 35 at [facility]  no 2. Thalassemia ([place], [place], [place], or Asian background): MCV<80  no 3. Neural tube defect (meningomyelocele, spina bifida, anencephaly)  no 4. Congenital heart defect  no  5. Down syndrome  no 6. Tay-Sachs (Jewish, [place])  no 7. Canavan's Disease  no 8. Sickle cell disease or trait (African)  no  9. Hemophilia or other blood disorders  no  10. Muscular dystrophy  no  11. Cystic fibrosis  no  12. Huntington's  Chorea  no  13. Mental retardation/autism  no 14. Other inherited genetic or chromosomal disorder  no 15. Maternal metabolic disorder (DM, PKU, etc)  no 16. Patient or FOB with a child with a birth defect not listed above no  16a. Patient or FOB with a birth defect themselves no 17. Recurrent pregnancy loss, or stillbirth  no  18. Any medications since LMP other than prenatal vitamins (include vitamins, supplements, OTC meds, drugs, alcohol)  no 19. Any other genetic/environmental exposure to discuss  no  Infection History:   1. Lives with someone with TB or TB exposed  no  2. Patient or partner has history of genital herpes  no 3. Rash or viral illness since LMP  no 4. History of STI (GC, CT, HPV, syphilis, HIV)  no 5. History of recent travel :  no  Other pertinent information:  no    Review of Systems:10 point review of systems negative unless otherwise noted in HPI  Past Medical History:  Patient Active Problem List   Diagnosis Date Noted   Supervision of other normal pregnancy, antepartum 02/03/2023     Clinical Staff Provider  Office Location  [facility] Dating  Not found.  Language  English Anatomy US    Flu Vaccine  10/06/22 Genetic Screen  NIPS:   TDaP vaccine  Offer Hgb A1C or  GTT Early : Third trimester :   Covid Declined   LAB RESULTS  Rhogam   A+ Blood Type     Feeding Plan Breastfeed Antibody    Contraception Undecided Rubella    Circumcision Yes RPR     Pediatrician  [facility] HBsAg     Support Person FOB, Pt's mom HIV    Prenatal Classes No Varicella     GBS  (For PCN allergy, check sensitivities)   BTL Consent  Hep C     VBAC Consent  Pap Diagnosis  Date Value Ref Range Status  05/04/2022      - Negative for intraepithelial lesion or malignancy (NILM)      Hgb Electro      CF      SMA             Type A blood, Rh positive 03/03/2021    Past Surgical History:  Past Surgical History:  Procedure Laterality Date    adenoidectomy     TONSILLECTOMY Bilateral    WISDOM TOOTH EXTRACTION      Gynecologic History: Patient's last menstrual period was 12/07/2022 (exact date).  Obstetric History: [id number]  Family History:  Family History  Problem Relation Age of Onset   Healthy Mother    Healthy Father    Leukemia Brother    Diabetes Paternal Grandfather    Breast cancer Neg Hx    Ovarian cancer Neg Hx    Colon cancer Neg Hx     Social History:  Social History   Socioeconomic History   Marital status: Married    Spouse name: Annie Main   Number of children: 2   Years of education: 14   Highest education level: Associate degree: academic program  Occupational History   Occupation: [employer]  Tobacco Use   Smoking status: Never   Smokeless tobacco: Never  Vaping Use   Vaping Use: Never used  Substance and Sexual Activity   Alcohol use: Not Currently   Drug use: Never   Sexual activity: Yes    Partners: Male    Birth control/protection: None  Other Topics Concern   Not on file  Social History Narrative   Not on file   Social Determinants of Health   Financial Resource Strain: Low Risk  (02/03/2023)   Overall Financial Resource Strain (CARDIA)    Difficulty of Paying Living Expenses: Not very hard  Food Insecurity: No Food Insecurity (02/03/2023)   Hunger Vital Sign    Worried About Running Out of Food in the Last Year: Never true    Ran Out of Food in the Last Year: Never true  Transportation Needs: No Transportation Needs (02/03/2023)   PRAPARE - Hydrologist (Medical): No    Lack of Transportation (Non-Medical): No  Physical Activity: Insufficiently Active (02/03/2023)   Exercise Vital Sign    Days of Exercise per Week: 1 day    Minutes of Exercise per Session: 60 min  Stress: No Stress Concern Present (02/03/2023)   [facility]    Feeling of Stress : Not at all  Social Connections:  Moderately Integrated (02/03/2023)   Social Connection and Isolation Panel [NHANES]    Frequency of Communication with Friends and Family: Twice a week    Frequency of Social Gatherings with Friends and Family: Three times a week    Attends Religious Services: More than 4 times per year    Active Member of Clubs or Organizations: No    Attends Archivist Meetings: Never  Marital Status: Married  Human resources officer Violence: Not At Risk (02/03/2023)   Humiliation, Afraid, Rape, and Kick questionnaire    Fear of Current or Ex-Partner: No    Emotionally Abused: No    Physically Abused: No    Sexually Abused: No    Allergies:  No Known Allergies  Medications: Prior to Admission medications   Medication Sig Start Date End Date Taking? Authorizing Provider  Prenatal Vit-Fe Fumarate-FA (PRENATAL PO) Take by mouth.   Yes [provider]  ondansetron (ZOFRAN) 4 MG tablet Take 1 tablet (4 mg total) by mouth every 8 (eight) hours as needed for nausea or vomiting. 02/28/23   Rod Can, CNM  sertraline (ZOLOFT) 50 MG tablet Take 1 tablet (50 mg total) by mouth daily. Patient not taking: No sig reported 10/30/19 12/04/20  Philip Aspen, CNM    Physical Exam Vitals: Blood pressure 123/66, pulse 77, weight 237 lb (107.5 kg), last menstrual period 12/07/2022  General: NAD HEENT: normocephalic, anicteric Thyroid: no enlargement, no palpable nodules Pulmonary: No increased work of breathing, CTAB Cardiovascular: RRR, distal pulses 2+ Abdomen: NABS, soft, non-tender, non-distended.  Umbilicus without lesions.  No hepatomegaly, splenomegaly or masses palpable. No evidence of hernia  Extremities: no edema, erythema, or tenderness Neurologic: Grossly intact Psychiatric: mood appropriate, affect full   The following were addressed during this visit:  Breastfeeding Education - Early initiation of breastfeeding    Comments: Keeps milk supply adequate, helps contract uterus  and slow bleeding, and early milk is the perfect first food and is easy to digest.   - The importance of exclusive breastfeeding    Comments: Provides antibodies, Lower risk of breast and ovarian cancers, and type-2 diabetes,Helps your body recover, Reduced chance of SIDS.   - Risks of giving your baby anything other than breast milk if you are breastfeeding    Comments: Make the baby less content with breastfeeds, may make my baby more susceptible to illness, and may reduce my milk supply.   - The importance of early skin-to-skin contact    Comments:  Keeps baby warm and secure, helps keep baby's blood sugar up and breathing steady, easier to bond and breastfeed, and helps calm baby.  - Rooming-in on a 24-hour basis    Comments: Easier to learn baby's feeding cues, easier to bond and get to know each other, and encourages milk production.   - Feeding on demand or baby-led feeding    Comments: Helps prevent breastfeeding complications, helps bring in good milk supply, prevents under or overfeeding, and helps baby feel content and satisfied   - Frequent feeding to help assure optimal milk production    Comments: Making a full supply of milk requires frequent removal of milk from breasts, infant will eat 8-12 times in 24 hours, if separated from infant use breast massage, hand expression and/ or pumping to remove milk from breasts.   - Effective positioning and attachment    Comments: Helps my baby to get enough breast milk, helps to produce an adequate milk supply, and helps prevent nipple pain and damage   - Exclusive breastfeeding for the first 6 months    Comments: Builds a healthy milk supply and keeps it up, protects baby from sickness and disease, and breastmilk has everything your baby needs for the first 6 months.    Assessment: 26 y.o. [id number] at 102w0d presenting to initiate prenatal care  Plan: 1) Avoid alcoholic beverages. 2) Patient encouraged not to smoke.  3)  Discontinue the use  of all non-medicinal drugs and chemicals.  4) Take prenatal vitamins daily.  5) Nutrition, food safety (fish, cheese advisories, and high nitrite foods) and exercise discussed. 6) Hospital and practice style discussed with cross coverage system.  7) Genetic Screening, such as with 1st Trimester Screening, cell free fetal DNA, AFP testing, and Ultrasound, as well as with amniocentesis and CVS as appropriate, is discussed with patient. At the conclusion of today's visit patient requested genetic testing 8) Patient is asked about travel to areas at risk for the Zika virus, and counseled to avoid travel and exposure to mosquitoes or sexual partners who may have themselves been exposed to the virus. Testing is discussed, and will be ordered as appropriate.  9) Nausea: Refill zofran 10) Return to clinic in 4 weeks for ROB/Hgb A1C 11) BMI 40+; baby ASA start at 12 weeks, early hgb a1c, 3rd trimester APT   Rod Can, [facility] 03/02/2023, 11:38 AM

## 2023-03-05 LAB — MATERNIT 21 PLUS CORE, BLOOD
Fetal Fraction: 7
Result (T21): NEGATIVE
Trisomy 13 (Patau syndrome): NEGATIVE
Trisomy 18 (Edwards syndrome): NEGATIVE
Trisomy 21 (Down syndrome): NEGATIVE

## 2023-03-07 DIAGNOSIS — Z3483 Encounter for supervision of other normal pregnancy, third trimester: Secondary | ICD-10-CM | POA: Diagnosis not present

## 2023-03-07 DIAGNOSIS — Z3482 Encounter for supervision of other normal pregnancy, second trimester: Secondary | ICD-10-CM | POA: Diagnosis not present

## 2023-03-28 ENCOUNTER — Other Ambulatory Visit (HOSPITAL_COMMUNITY)
Admission: RE | Admit: 2023-03-28 | Discharge: 2023-03-28 | Disposition: A | Discharge status: Home / Self Care | Payer: 59 | Source: Ambulatory Visit | Attending: Licensed Practical Nurse | Admitting: Licensed Practical Nurse

## 2023-03-28 ENCOUNTER — Ambulatory Visit (INDEPENDENT_AMBULATORY_CARE_PROVIDER_SITE_OTHER): Payer: 59 | Admitting: Licensed Practical Nurse

## 2023-03-28 VITALS — BP 123/82 | HR 105 | Wt 235.9 lb

## 2023-03-28 DIAGNOSIS — Z3481 Encounter for supervision of other normal pregnancy, first trimester: Secondary | ICD-10-CM | POA: Diagnosis not present

## 2023-03-28 DIAGNOSIS — Z3480 Encounter for supervision of other normal pregnancy, unspecified trimester: Secondary | ICD-10-CM

## 2023-03-28 DIAGNOSIS — O029 Abnormal product of conception, unspecified: Secondary | ICD-10-CM | POA: Diagnosis not present

## 2023-03-28 DIAGNOSIS — Z3A14 14 weeks gestation of pregnancy: Secondary | ICD-10-CM | POA: Diagnosis not present

## 2023-03-28 DIAGNOSIS — Z3482 Encounter for supervision of other normal pregnancy, second trimester: Secondary | ICD-10-CM

## 2023-03-28 DIAGNOSIS — O209 Hemorrhage in early pregnancy, unspecified: Secondary | ICD-10-CM

## 2023-03-28 LAB — POCT URINALYSIS DIPSTICK
Bilirubin, UA: NEGATIVE
Blood, UA: NEGATIVE
Glucose, UA: NEGATIVE
Ketones, UA: NEGATIVE
Leukocytes, UA: NEGATIVE
Nitrite, UA: NEGATIVE
Protein, UA: NEGATIVE
Spec Grav, UA: 1.015 (ref 1.010–1.025)
Urobilinogen, UA: 1 E.U./dL
pH, UA: 6.5 (ref 5.0–8.0)

## 2023-03-28 NOTE — Progress Notes (Unsigned)
Routine Prenatal Care Visit  Subjective  Valerie Garner is a 27 y.o. G3P2002 at [redacted]w[redacted]d being seen today for ongoing prenatal care.  She is currently monitored for the following issues for this {Blank single:19197::"high-risk","low-risk"} pregnancy and has Type A blood, Rh positive and Supervision of other normal pregnancy, antepartum on their problem list.  ----------------------------------------------------------------------------------- Patient reports {sx:14538}.   Contractions: Not present. Vag. Bleeding: Bloody Show.  Movement: Absent. Leaking Fluid {Actions; denies/reports/admits to:19208}.  ----------------------------------------------------------------------------------- The following portions of the patient's history were reviewed and updated as appropriate: allergies, current medications, past family history, past medical history, past social history, past surgical history and problem list. Problem list updated.  Objective  Blood pressure 123/82, pulse (!) 105, weight 235 lb 14.4 oz (107 kg), last menstrual period 12/07/2022, not currently breastfeeding. Pregravid weight 240 lb (108.9 kg) Total Weight Gain -4 lb 1.6 oz (-1.86 kg) Urinalysis: Urine Protein    Urine Glucose    Fetal Status: Fetal Heart Rate (bpm): 147   Movement: Absent     General:  Alert, oriented and cooperative. Patient is in no acute distress.  Skin: Skin is warm and dry. No rash noted.   Cardiovascular: Normal heart rate noted  Respiratory: Normal respiratory effort, no problems with respiration noted  Abdomen: Soft, gravid, appropriate for gestational age. Pain/Pressure: Present     Pelvic:  Cervical exam performed   cervix friable, small cyst at 2 O'clock, os closed      Extremities: Normal range of motion.     Mental Status: Normal mood and affect. Normal behavior. Normal judgment and thought content.   Assessment   27 y.o. [id number] at [redacted]w[redacted]d by  09/26/2023, by Ultrasound presenting for {Blank  single:19197::"routine","work-in"} prenatal visit  Plan   THIRD Problems (from 02/03/23 to present)     No problems associated with this episode.        {Blank single:19197::"Term","Preterm"} labor symptoms and general obstetric precautions including but not limited to vaginal bleeding, contractions, leaking of fluid and fetal movement were reviewed in detail with the patient. Please refer to After Visit Summary for other counseling recommendations.   No follow-ups on file.  @

## 2023-03-30 ENCOUNTER — Ambulatory Visit (INDEPENDENT_AMBULATORY_CARE_PROVIDER_SITE_OTHER): Payer: 59

## 2023-03-30 DIAGNOSIS — O209 Hemorrhage in early pregnancy, unspecified: Secondary | ICD-10-CM | POA: Diagnosis not present

## 2023-03-30 DIAGNOSIS — Z3A14 14 weeks gestation of pregnancy: Secondary | ICD-10-CM

## 2023-03-30 DIAGNOSIS — Z3481 Encounter for supervision of other normal pregnancy, first trimester: Secondary | ICD-10-CM

## 2023-03-30 LAB — CERVICOVAGINAL ANCILLARY ONLY
Bacterial Vaginitis (gardnerella): NEGATIVE
Candida Glabrata: NEGATIVE
Candida Vaginitis: NEGATIVE
Comment: NEGATIVE
Comment: NEGATIVE
Comment: NEGATIVE

## 2023-04-04 ENCOUNTER — Encounter: Payer: Self-pay | Admitting: Advanced Practice Midwife

## 2023-04-04 DIAGNOSIS — O219 Vomiting of pregnancy, unspecified: Secondary | ICD-10-CM

## 2023-04-05 MED ORDER — ONDANSETRON HCL 4 MG PO TABS
4.0000 mg | ORAL_TABLET | Freq: Three times a day (TID) | ORAL | 0 refills | Status: DC | PRN
Start: 2023-04-05 — End: 2023-05-24

## 2023-04-25 ENCOUNTER — Encounter: Payer: Self-pay | Admitting: Advanced Practice Midwife

## 2023-04-25 ENCOUNTER — Ambulatory Visit (INDEPENDENT_AMBULATORY_CARE_PROVIDER_SITE_OTHER): Payer: Medicaid Other | Admitting: Advanced Practice Midwife

## 2023-04-25 ENCOUNTER — Ambulatory Visit: Payer: Medicaid Other

## 2023-04-25 VITALS — BP 110/56 | HR 82 | Wt 234.0 lb

## 2023-04-25 DIAGNOSIS — Z3A18 18 weeks gestation of pregnancy: Secondary | ICD-10-CM

## 2023-04-25 DIAGNOSIS — Z3481 Encounter for supervision of other normal pregnancy, first trimester: Secondary | ICD-10-CM

## 2023-04-25 DIAGNOSIS — Z369 Encounter for antenatal screening, unspecified: Secondary | ICD-10-CM

## 2023-04-25 DIAGNOSIS — Z3482 Encounter for supervision of other normal pregnancy, second trimester: Secondary | ICD-10-CM | POA: Diagnosis not present

## 2023-04-25 NOTE — Progress Notes (Signed)
Routine Prenatal Care Visit  Subjective  Valerie Garner is a 27 y.o. G3P2002 at [redacted]w[redacted]d being seen today for ongoing prenatal care.  She is currently monitored for the following issues for this low-risk pregnancy and has Type A blood, Rh positive and Supervision of other normal pregnancy, antepartum on their problem list.  ----------------------------------------------------------------------------------- Patient reports generally doing well. She does have low back pain. Comfort measures including abdominal support band, heat/ice, stretches/exercise, epsom salt soaks. We discussed anatomy scan done today. Follow up scan needed. She is taking baby asa.    Contractions: Not present. Vag. Bleeding: None.  Movement: Present. Leaking Fluid denies.  ----------------------------------------------------------------------------------- The following portions of the patient's history were reviewed and updated as appropriate: allergies, current medications, past family history, past medical history, past social history, past surgical history and problem list. Problem list updated.  Objective  Blood pressure (!) 110/56, pulse 82, weight 234 lb (106.1 kg), last menstrual period 12/07/2022 Pregravid weight 240 lb (108.9 kg) Total Weight Gain -6 lb (-2.722 kg) Urinalysis: Urine Protein    Urine Glucose    Fetal Status: Fetal Heart Rate (bpm): 142 Fundal Height: 19 cm Movement: Present     Anatomy scan today: incomplete for RVOT, LVOT, 3VV, profile, CSP, otherwise normal, female, anterior placenta  General:  Alert, oriented and cooperative. Patient is in no acute distress.  Skin: Skin is warm and dry. No rash noted.   Cardiovascular: Normal heart rate noted  Respiratory: Normal respiratory effort, no problems with respiration noted  Abdomen: Soft, gravid, appropriate for gestational age. Pain/Pressure: Absent     Pelvic:  Cervical exam deferred        Extremities: Normal range of motion.  Edema:  None  Mental Status: Normal mood and affect. Normal behavior. Normal judgment and thought content.   Assessment   27 y.o. [id number] at [redacted]w[redacted]d by  09/26/2023, by Ultrasound presenting for routine prenatal visit  Plan   THIRD Problems (from 02/03/23 to present)     Problem Noted Resolved   Supervision of other normal pregnancy, antepartum 02/03/2023 by Donnetta Hail, CMA No   Overview Addendum 03/28/2023  1:14 PM by Ellwood Sayers, CNM     Clinical Staff Provider  Office Location  [facility] Dating  By 9w u/s  Language  English Anatomy US    Flu Vaccine  10/06/22 Genetic Screen  NIPS: negative/female  TDaP vaccine  Offer Hgb A1C or  GTT Early : Third trimester :   Covid Declined   LAB RESULTS   Rhogam  A/Positive/-- (03/26 1428)A+ Blood Type A/Positive/-- (03/26 1428)   Feeding Plan Breastfeed Antibody Negative (03/26 1428)  Contraception Undecided Rubella 3.44 (03/26 1428)  Circumcision Yes RPR Non Reactive (03/26 1428)   Pediatrician  [facility] HBsAg Negative (03/26 1428)   Support Person FOB, Pt's mom HIV Non Reactive (03/26 1428)  Prenatal Classes No Varicella Immune    GBS  (For PCN allergy, check sensitivities)   BTL Consent  Hep C Non Reactive (03/26 1428)   VBAC Consent  Pap Diagnosis  Date Value Ref Range Status  05/04/2022      - Negative for intraepithelial lesion or malignancy (NILM)      Hgb Electro      CF      SMA                    Preterm labor symptoms and general obstetric precautions including but not limited to vaginal bleeding, contractions, leaking of  fluid and fetal movement were reviewed in detail with the patient. Please refer to After Visit Summary for other counseling recommendations.   Return in about 4 weeks (around 05/23/2023) for f/u anatomy and rob after.  Tresea Mall, CNM 04/25/2023 2:57 PM

## 2023-05-23 ENCOUNTER — Ambulatory Visit (INDEPENDENT_AMBULATORY_CARE_PROVIDER_SITE_OTHER): Payer: Medicaid Other

## 2023-05-23 DIAGNOSIS — Z369 Encounter for antenatal screening, unspecified: Secondary | ICD-10-CM

## 2023-05-23 DIAGNOSIS — Z3482 Encounter for supervision of other normal pregnancy, second trimester: Secondary | ICD-10-CM

## 2023-05-23 DIAGNOSIS — Z3A22 22 weeks gestation of pregnancy: Secondary | ICD-10-CM

## 2023-05-23 DIAGNOSIS — Z362 Encounter for other antenatal screening follow-up: Secondary | ICD-10-CM | POA: Diagnosis not present

## 2023-05-24 ENCOUNTER — Encounter: Payer: Self-pay | Admitting: Licensed Practical Nurse

## 2023-05-24 ENCOUNTER — Ambulatory Visit (INDEPENDENT_AMBULATORY_CARE_PROVIDER_SITE_OTHER): Payer: Medicaid Other | Admitting: Licensed Practical Nurse

## 2023-05-24 VITALS — BP 127/67 | HR 96 | Wt 235.3 lb

## 2023-05-24 DIAGNOSIS — O219 Vomiting of pregnancy, unspecified: Secondary | ICD-10-CM

## 2023-05-24 DIAGNOSIS — Z131 Encounter for screening for diabetes mellitus: Secondary | ICD-10-CM

## 2023-05-24 DIAGNOSIS — Z3482 Encounter for supervision of other normal pregnancy, second trimester: Secondary | ICD-10-CM

## 2023-05-24 DIAGNOSIS — Z3A22 22 weeks gestation of pregnancy: Secondary | ICD-10-CM

## 2023-05-24 LAB — POCT URINALYSIS DIPSTICK
Bilirubin, UA: NEGATIVE
Blood, UA: NEGATIVE
Glucose, UA: NEGATIVE
Ketones, UA: NEGATIVE
Leukocytes, UA: NEGATIVE
Nitrite, UA: NEGATIVE
Protein, UA: NEGATIVE
Spec Grav, UA: 1.02 (ref 1.010–1.025)
Urobilinogen, UA: 0.2 E.U./dL
pH, UA: 6.5 (ref 5.0–8.0)

## 2023-05-24 MED ORDER — ONDANSETRON HCL 4 MG PO TABS
4.0000 mg | ORAL_TABLET | Freq: Three times a day (TID) | ORAL | 0 refills | Status: DC | PRN
Start: 2023-05-24 — End: 2023-08-20

## 2023-05-24 NOTE — Progress Notes (Signed)
Routine Prenatal Care Visit  Subjective  Valerie Garner Valerie Garner is a 27 y.o. G3P2002 at [redacted]w[redacted]d being seen today for ongoing prenatal care.  She is currently monitored for the following issues for this high-risk pregnancy and has Type A blood, Rh positive and Supervision of other normal pregnancy, antepartum on their problem list.  ----------------------------------------------------------------------------------- Patient reports nausea-would like refill of Zofran -otherwise doing well. Mood is good -thinking about planning for labor, has a 4 and 26 y/o at home, has plenty of family nearby. Would like her mother for labor support, but her mother may need to watch her children -going to the beach in [place], precautions given  -reviewed GDM screening at next visit   Contractions: Not present. Vag. Bleeding: None.  Movement: Present. Leaking Fluid denies.  ----------------------------------------------------------------------------------- The following portions of the patient's history were reviewed and updated as appropriate: allergies, current medications, past family history, past medical history, past social history, past surgical history and problem list. Problem list updated.  Objective  Blood pressure 127/67, pulse 96, weight 235 lb 4.8 oz (106.7 kg), last menstrual period 12/07/2022, not currently breastfeeding. Pregravid weight 240 lb (108.9 kg) Total Weight Gain -4 lb 11.2 oz (-2.132 kg) Urinalysis: Urine Protein    Urine Glucose    Fetal Status: Fetal Heart Rate (bpm): 146 Fundal Height: 22 cm Movement: Present     General:  Alert, oriented and cooperative. Patient is in no acute distress.  Skin: Skin is warm and dry. No rash noted.   Cardiovascular: Normal heart rate noted  Respiratory: Normal respiratory effort, no problems with respiration noted  Abdomen: Soft, gravid, appropriate for gestational age. Pain/Pressure: Absent     Pelvic:  Cervical exam deferred        Extremities:  Normal range of motion.     Mental Status: Normal mood and affect. Normal behavior. Normal judgment and thought content.   Assessment   26 y.o. G3P2002 at [redacted]w[redacted]d by  09/26/2023, by Ultrasound presenting for routine prenatal visit  Plan   THIRD Problems (from 02/03/23 to present)     Problem Noted Resolved   Supervision of other normal pregnancy, antepartum 02/03/2023 by Donnetta Hail, CMA No   Overview Addendum 04/25/2023  3:02 PM by Tresea Mall, CNM     Clinical Staff Provider  Office Location  [facility] Dating  By 9w u/s  Language  English Anatomy US    Flu Vaccine  10/06/22 Genetic Screen  NIPS: negative/female  TDaP vaccine  Offer Hgb A1C or  GTT Early : Third trimester :   Covid Declined   LAB RESULTS   Rhogam  A/Positive/-- (03/26 1428)A+ Blood Type A/Positive/-- (03/26 1428)   Feeding Plan Breastfeed Antibody Negative (03/26 1428)  Contraception Undecided Rubella 3.44 (03/26 1428)  Circumcision Yes RPR Non Reactive (03/26 1428)   Pediatrician  [facility] HBsAg Negative (03/26 1428)   Support Person FOB, Pt's mom HIV Non Reactive (03/26 1428)  Prenatal Classes No Varicella Immune    GBS  (For PCN allergy, check sensitivities)   BTL Consent  Hep C Non Reactive (03/26 1428)   VBAC Consent  Pap Diagnosis  Date Value Ref Range Status  05/04/2022      - Negative for intraepithelial lesion or malignancy (NILM)      Hgb Electro      CF      SMA                    Preterm labor symptoms and  general obstetric precautions including but not limited to vaginal bleeding, contractions, leaking of fluid and fetal movement were reviewed in detail with the patient. Please refer to After Visit Summary for other counseling recommendations.   Return in about 2 weeks (around 06/07/2023) for ROB, 28 wk labs.   Carie Caddy, CNM  [facility]  05/24/23  5:42 PM

## 2023-06-27 ENCOUNTER — Ambulatory Visit (INDEPENDENT_AMBULATORY_CARE_PROVIDER_SITE_OTHER): Payer: Medicaid Other | Admitting: Certified Nurse Midwife

## 2023-06-27 ENCOUNTER — Other Ambulatory Visit: Payer: Medicaid Other

## 2023-06-27 VITALS — BP 113/74 | HR 87 | Wt 234.5 lb

## 2023-06-27 DIAGNOSIS — Z3A27 27 weeks gestation of pregnancy: Secondary | ICD-10-CM

## 2023-06-27 DIAGNOSIS — Z3482 Encounter for supervision of other normal pregnancy, second trimester: Secondary | ICD-10-CM

## 2023-06-27 DIAGNOSIS — Z131 Encounter for screening for diabetes mellitus: Secondary | ICD-10-CM

## 2023-06-27 DIAGNOSIS — Z23 Encounter for immunization: Secondary | ICD-10-CM | POA: Diagnosis not present

## 2023-06-27 DIAGNOSIS — O99210 Obesity complicating pregnancy, unspecified trimester: Secondary | ICD-10-CM

## 2023-06-27 LAB — POCT URINALYSIS DIPSTICK OB
Bilirubin, UA: NEGATIVE
Blood, UA: NEGATIVE
Glucose, UA: NEGATIVE
Leukocytes, UA: NEGATIVE
Nitrite, UA: NEGATIVE
POC,PROTEIN,UA: NEGATIVE
Spec Grav, UA: <=1.005 — AB (ref 1.010–1.025)
Urobilinogen, UA: 0.2 E.U./dL
pH, UA: 5 (ref 5.0–8.0)

## 2023-06-27 NOTE — Patient Instructions (Signed)
Oral Glucose Tolerance Test During Pregnancy Why am I having this test? The oral glucose tolerance test (OGTT) is done to check how your body processes blood sugar (glucose). This is one of several tests used to diagnose diabetes that develops during pregnancy (gestational diabetes mellitus). Gestational diabetes is a short-term form of diabetes that some women develop while they are pregnant. It usually occurs during the second trimester of pregnancy and goes away after delivery. Testing, or screening, for gestational diabetes usually occurs at weeks 24-28 of pregnancy. You may have the OGTT test after having a 1-hour glucose screening test if the results from that test indicate that you may have gestational diabetes. This test may also be needed if: You have a history of gestational diabetes. There is a history of giving birth to very large babies or of losing pregnancies (having stillbirths). You have signs and symptoms of diabetes, such as: Changes in your eyesight. Tingling or numbness in your hands or feet. Changes in hunger, thirst, and urination, and these are not explained by your pregnancy. What is being tested? This test measures the amount of glucose in your blood at different times during a period of 3 hours. This shows how well your body can process glucose. What kind of sample is taken?  Blood samples are required for this test. They are usually collected by inserting a needle into a blood vessel. How do I prepare for this test? For 3 days before your test, eat normally. Have plenty of carbohydrate-rich foods. Follow instructions from your health care provider about: Eating or drinking restrictions on the day of the test. You may be asked not to eat or drink anything other than water (to fast) starting 8-10 hours before the test. Changing or stopping your regular medicines. Some medicines may interfere with this test. Tell a health care provider about: All medicines you are  taking, including vitamins, herbs, eye drops, creams, and over-the-counter medicines. Any blood disorders you have. Any surgeries you have had. Any medical conditions you have. What happens during the test? First, your blood glucose will be measured. This is referred to as your fasting blood glucose because you fasted before the test. Then, you will drink a glucose solution that contains a certain amount of glucose. Your blood glucose will be measured again 1, 2, and 3 hours after you drink the solution. This test takes about 3 hours to complete. You will need to stay at the testing location during this time. During the testing period: Do not eat or drink anything other than the glucose solution. Do not exercise. Do not use any products that contain nicotine or tobacco, such as cigarettes, e-cigarettes, and chewing tobacco. These can affect your test results. If you need help quitting, ask your health care provider. The testing procedure may vary among health care providers and hospitals. How are the results reported? Your results will be reported as milligrams of glucose per deciliter of blood (mg/dL) or millimoles per liter (mmol/L). There is more than one source for screening and diagnosis reference values used to diagnose gestational diabetes. Your health care provider will compare your results to normal values that were established after testing a large group of people (reference values). Reference values may vary among labs and hospitals. For this test (Carpenter-Coustan), reference values are: Fasting: 95 mg/dL (5.3 mmol/L). 1 hour: 180 mg/dL (10.0 mmol/L). 2 hour: 155 mg/dL (8.6 mmol/L). 3 hour: 140 mg/dL (7.8 mmol/L). What do the results mean? Results below the reference values are   considered normal. If two or more of your blood glucose levels are at or above the reference values, you may be diagnosed with gestational diabetes. If only one level is high, your health care provider may  suggest repeat testing or other tests to confirm a diagnosis. Talk with your health care provider about what your results mean. Questions to ask your health care provider Ask your health care provider, or the department that is doing the test: When will my results be ready? How will I get my results? What are my treatment options? What other tests do I need? What are my next steps? Summary The oral glucose tolerance test (OGTT) is one of several tests used to diagnose diabetes that develops during pregnancy (gestational diabetes mellitus). Gestational diabetes is a short-term form of diabetes that some women develop while they are pregnant. You may have the OGTT test after having a 1-hour glucose screening test if the results from that test show that you may have gestational diabetes. You may also have this test if you have any symptoms or risk factors for this type of diabetes. Talk with your health care provider about what your results mean. This information is not intended to replace advice given to you by your health care provider. Make sure you discuss any questions you have with your health care provider. Document Revised: 06/28/2022 Document Reviewed: 04/30/2020 Elsevier Patient Education  2024 Elsevier Inc.  

## 2023-06-27 NOTE — Progress Notes (Signed)
ROB doing well. Feels good movement. 28 wk labs today: Glucose screen/RPR/CBC. Tdap done, Blood transfusion consent completed, all questions answered. . Sample birth plan given, will follow up in upcoming visits. Discussed birth control after delivery, information pamphlet given.   Discussed induction recommendations , pt would prefer to wait for labor . She may consider at 40 wks.   Follow up 2 wk for ROB or sooner if needed.    [name], CNM

## 2023-06-28 LAB — 28 WEEK RH+PANEL
Basophils Absolute: 0 10*3/uL (ref 0.0–0.2)
Basos: 0 %
EOS (ABSOLUTE): 0.1 10*3/uL (ref 0.0–0.4)
Eos: 1 %
Gestational Diabetes Screen: 153 mg/dL — ABNORMAL HIGH (ref 70–139)
HIV Screen 4th Generation wRfx: NONREACTIVE
Hematocrit: 33.5 % — ABNORMAL LOW (ref 34.0–46.6)
Hemoglobin: 11.7 g/dL (ref 11.1–15.9)
Immature Grans (Abs): 0.1 10*3/uL (ref 0.0–0.1)
Immature Granulocytes: 1 %
Lymphocytes Absolute: 1.7 10*3/uL (ref 0.7–3.1)
Lymphs: 15 %
MCH: 30.8 pg (ref 26.6–33.0)
MCHC: 34.9 g/dL (ref 31.5–35.7)
MCV: 88 fL (ref 79–97)
Monocytes Absolute: 0.6 10*3/uL (ref 0.1–0.9)
Monocytes: 5 %
Neutrophils Absolute: 8.7 10*3/uL — ABNORMAL HIGH (ref 1.4–7.0)
Neutrophils: 78 %
Platelets: 297 10*3/uL (ref 150–450)
RBC: 3.8 x10E6/uL (ref 3.77–5.28)
RDW: 11.8 % (ref 11.7–15.4)
RPR Ser Ql: NONREACTIVE
WBC: 11.2 10*3/uL — ABNORMAL HIGH (ref 3.4–10.8)

## 2023-07-04 ENCOUNTER — Encounter: Payer: Self-pay | Admitting: Certified Nurse Midwife

## 2023-07-04 ENCOUNTER — Other Ambulatory Visit: Payer: Self-pay | Admitting: Licensed Practical Nurse

## 2023-07-04 DIAGNOSIS — O99810 Abnormal glucose complicating pregnancy: Secondary | ICD-10-CM

## 2023-07-04 NOTE — Progress Notes (Signed)
Pt with abnormal 1 hour glucose. 3 hour glucose ordered. Pt notified through mychart Carie Caddy, [place]   [facility]  07/04/23  1:15 PM

## 2023-07-07 ENCOUNTER — Other Ambulatory Visit: Payer: Medicaid Other

## 2023-07-07 DIAGNOSIS — O99810 Abnormal glucose complicating pregnancy: Secondary | ICD-10-CM | POA: Diagnosis not present

## 2023-07-11 ENCOUNTER — Ambulatory Visit: Payer: Medicaid Other | Admitting: Obstetrics and Gynecology

## 2023-07-11 ENCOUNTER — Encounter: Payer: Self-pay | Admitting: Obstetrics and Gynecology

## 2023-07-11 VITALS — BP 118/67 | HR 101 | Wt 235.6 lb

## 2023-07-11 DIAGNOSIS — Z3482 Encounter for supervision of other normal pregnancy, second trimester: Secondary | ICD-10-CM

## 2023-07-11 DIAGNOSIS — Z3480 Encounter for supervision of other normal pregnancy, unspecified trimester: Secondary | ICD-10-CM

## 2023-07-11 DIAGNOSIS — Z3A29 29 weeks gestation of pregnancy: Secondary | ICD-10-CM

## 2023-07-11 LAB — POCT URINALYSIS DIPSTICK OB
Bilirubin, UA: NEGATIVE
Blood, UA: NEGATIVE
Glucose, UA: NEGATIVE
Ketones, UA: NEGATIVE
Leukocytes, UA: NEGATIVE
Nitrite, UA: NEGATIVE
Spec Grav, UA: 1.015 (ref 1.010–1.025)
Urobilinogen, UA: 0.2 E.U./dL
pH, UA: 6 (ref 5.0–8.0)

## 2023-07-11 NOTE — Progress Notes (Signed)
ROB [redacted]w[redacted]d: She is doing well. She reports good fetal movement. She has no new concerns today.

## 2023-07-11 NOTE — Progress Notes (Signed)
ROB: Patient is a 27 y.o. G3P2002 at [redacted]w[redacted]d who presents for routine OB care.  Pregnancy is complicated by Obesity in pregnancy; and Supervision of other normal pregnancy, antepartum. Patient denies complaints.  Had abnormal 1 hr glucola (153), performed 3 hour testing several days ago, however labs not resulted.  Will follow up.  Patient plans on Nexplanon for contraception, desires to breastfeed. Plans to use same pediatrician.  RTC in 2 weeks. Is scheduled for growth scan later this week.

## 2023-07-13 ENCOUNTER — Ambulatory Visit (INDEPENDENT_AMBULATORY_CARE_PROVIDER_SITE_OTHER): Payer: Medicaid Other

## 2023-07-13 DIAGNOSIS — Z3A27 27 weeks gestation of pregnancy: Secondary | ICD-10-CM | POA: Diagnosis not present

## 2023-07-13 DIAGNOSIS — O99210 Obesity complicating pregnancy, unspecified trimester: Secondary | ICD-10-CM

## 2023-07-13 DIAGNOSIS — Z3482 Encounter for supervision of other normal pregnancy, second trimester: Secondary | ICD-10-CM | POA: Diagnosis not present

## 2023-07-15 ENCOUNTER — Other Ambulatory Visit: Payer: Self-pay

## 2023-07-15 ENCOUNTER — Encounter: Payer: Self-pay | Admitting: Obstetrics and Gynecology

## 2023-07-15 ENCOUNTER — Observation Stay
Admission: EM | Admit: 2023-07-15 | Discharge: 2023-07-15 | Disposition: A | Discharge status: Home / Self Care | Payer: Medicaid Other

## 2023-07-15 DIAGNOSIS — O26893 Other specified pregnancy related conditions, third trimester: Secondary | ICD-10-CM | POA: Diagnosis not present

## 2023-07-15 DIAGNOSIS — O368330 Maternal care for abnormalities of the fetal heart rate or rhythm, third trimester, not applicable or unspecified: Secondary | ICD-10-CM | POA: Insufficient documentation

## 2023-07-15 DIAGNOSIS — O368130 Decreased fetal movements, third trimester, not applicable or unspecified: Principal | ICD-10-CM | POA: Insufficient documentation

## 2023-07-15 DIAGNOSIS — Z3A29 29 weeks gestation of pregnancy: Secondary | ICD-10-CM | POA: Insufficient documentation

## 2023-07-15 DIAGNOSIS — Z3480 Encounter for supervision of other normal pregnancy, unspecified trimester: Principal | ICD-10-CM

## 2023-07-15 NOTE — OB Triage Note (Signed)
Discharge instructions reviewed and red flag precautions discussed with CNM. Pt verbalized understanding. Follow-up care reviewed. Pt stable at the time of discharge.

## 2023-07-15 NOTE — OB Triage Note (Signed)
LABOR & DELIVERY OB TRIAGE NOTE  SUBJECTIVE  HPI Valerie Garner Valerie Garner is a 27 y.o. [id number] at [redacted]w[redacted]d who presents to Labor & Delivery for decreased fetal movement and abdominal pain.  She notes that she has not been feeling baby move today after feeling normal movement previously. Additionally, she noticed abdominal pain starting last night that wraps around the sides of her abdomen and across the top. Pain was relieved while taking a warm bath but came back after. She has also tried Tylenol without much relief. She states her last bowel movement was last night or this morning. She denies contractions, LOF, vaginal bleeding.   OB History     Gravida  3   Para  2   Term  2   Preterm      AB      Living  2      SAB      IAB      Ectopic      Multiple  0   Live Births  2          OBJECTIVE  BP 127/63 (BP Location: Right Arm)   Pulse (!) 123   Temp 98.5 F (36.9 C) (Oral)   Resp 18   Ht 5\' 4"  (1.626 m)   Wt 106.6 kg   LMP 12/07/2022 (Exact Date)   BMI 40.34 kg/m   General: AOx4 Heart: slightly elevated heart rate Lungs: normal work of breathing Abdomen: soft, non-tender, gravid Cervical exam:   deferred  NST I reviewed the NST and it was reactive.  Baseline: 145 bpm Variability: moderate Accelerations: >2 15x15 Decelerations:none Toco: uterine irritability Category I  ASSESSMENT Impression  1) Pregnancy at [id number], [redacted]w[redacted]d, Estimated Date of Delivery: 09/26/23 2) Reassuring maternal/fetal status 3) MSK pain associated with advancing pregnancy.   PLAN 1) Reviewed recent ultrasound showing normal growth, AFI WNL, and anterior placenta. Discussed how anterior placenta can affect ability to feel fetal movement at times. Reviewed FKC. 2) Reviewed comfort measures for muscle pain in pregnancy. Recommended use of pregnancy support band. Also discussed pelvic floor physical therapy. Referral provided. 3) Continue with routine prenatal care.    Lindalou Hose Othmar Ringer, CNM  07/15/23  3:57 PM

## 2023-07-15 NOTE — OB Triage Note (Signed)
Pt is a G3P2 and [redacted]w[redacted]d presenting to L&D with c/o abdominal pain since 1800 last night and decreased feta movement today. Pt describes pain as "achy sharp." Pt reports pain 0/5 on a 0-10 pain scale. Pt denies VB, LOF or ctx. Pt denies recent intercourse and reports adequate hydration. VSS. Monitors applied and assessing.

## 2023-07-24 ENCOUNTER — Encounter: Payer: Self-pay | Admitting: Certified Nurse Midwife

## 2023-07-24 ENCOUNTER — Ambulatory Visit (INDEPENDENT_AMBULATORY_CARE_PROVIDER_SITE_OTHER): Payer: Medicaid Other | Admitting: Certified Nurse Midwife

## 2023-07-24 VITALS — BP 113/69 | HR 92 | Wt 229.0 lb

## 2023-07-24 DIAGNOSIS — Z3A30 30 weeks gestation of pregnancy: Secondary | ICD-10-CM

## 2023-07-24 DIAGNOSIS — Z3483 Encounter for supervision of other normal pregnancy, third trimester: Secondary | ICD-10-CM

## 2023-07-24 LAB — POCT URINALYSIS DIPSTICK OB
Bilirubin, UA: NEGATIVE
Blood, UA: NEGATIVE
Glucose, UA: NEGATIVE
Ketones, UA: NEGATIVE
Leukocytes, UA: NEGATIVE
Nitrite, UA: NEGATIVE
POC,PROTEIN,UA: NEGATIVE
Spec Grav, UA: 1.015 (ref 1.010–1.025)
Urobilinogen, UA: 0.2 E.U./dL
pH, UA: 6 (ref 5.0–8.0)

## 2023-07-24 NOTE — Patient Instructions (Signed)

## 2023-07-24 NOTE — Progress Notes (Signed)
    Return Prenatal Note   Subjective   27 y.o. G2X5284 at [redacted]w[redacted]d presents for this follow-up prenatal visit.  Valerie Garner feeling well, wondering about baby's position as movements have felt different. Endorses usual movement. Patient reports: Movement: Present Contractions: Not present  Objective   Flow sheet Vitals: Pulse Rate: 92 BP: 113/69 Fundal Height: 31 cm Fetal Heart Rate (bpm): 140 Presentation: Homero Fellers Breech (BSUS) Total weight gain: -11 lb (-4.99 kg)  General Appearance  No acute distress, well appearing, and well nourished Pulmonary   Normal work of breathing Neurologic   Alert and oriented to person, place, and time Psychiatric   Mood and affect within normal limits  Assessment/Plan   Plan  27 y.o. [id number] at [redacted]w[redacted]d presents for follow-up OB visit. Reviewed prenatal record including previous visit note. 1. Encounter for supervision of other normal pregnancy in third trimester  2. [redacted] weeks gestation of pregnancy   Breech exercises provided via MyChart. Discussed NSTs & timing of delivery by [facility] if BMI reaches >/=40. Was induced in 2nd pregnancy, open to IOL if recommended but would like to avoid pitocin if possible.  No orders of the defined types were placed in this encounter.  Return in 2 weeks (on 08/07/2023) for ROB.   Future Appointments  Date Time Provider Department Center  08/08/2023  2:35 PM Free, Lindalou Hose, CNM [facility] None  08/22/2023  2:35 PM Allie Bossier, MD [facility] None    For next visit:  continue with routine prenatal care     Dominica Severin, CNM  07/23/2410:31 AM

## 2023-07-24 NOTE — Addendum Note (Signed)
Addended by: Loney Laurence on: 07/24/2023 04:02 PM   Modules accepted: Orders

## 2023-08-08 ENCOUNTER — Ambulatory Visit (INDEPENDENT_AMBULATORY_CARE_PROVIDER_SITE_OTHER): Payer: Medicaid Other

## 2023-08-08 VITALS — BP 122/77 | HR 110 | Wt 233.3 lb

## 2023-08-08 DIAGNOSIS — E669 Obesity, unspecified: Secondary | ICD-10-CM

## 2023-08-08 DIAGNOSIS — O99210 Obesity complicating pregnancy, unspecified trimester: Secondary | ICD-10-CM

## 2023-08-08 DIAGNOSIS — O99213 Obesity complicating pregnancy, third trimester: Secondary | ICD-10-CM

## 2023-08-08 DIAGNOSIS — Z3480 Encounter for supervision of other normal pregnancy, unspecified trimester: Secondary | ICD-10-CM

## 2023-08-08 DIAGNOSIS — Z3A33 33 weeks gestation of pregnancy: Secondary | ICD-10-CM

## 2023-08-08 NOTE — Assessment & Plan Note (Signed)
Discussed birth planning. Would like spontaneous labor this time as she has had pitocin with both of her previous births; however, she is open to induction when recommended. Reviewed kick counts and preterm labor warning signs. Instructed to call office or come to hospital with persistent headache, vision changes, regular contractions, leaking of fluid, decreased fetal movement or vaginal bleeding.

## 2023-08-08 NOTE — Assessment & Plan Note (Signed)
Last growth ultrasound on 8/8, EFW 51.7%tile, AFI 17.38. Growth ultrasound ordered for next week.

## 2023-08-08 NOTE — Progress Notes (Signed)
    Return Prenatal Note   Assessment/Plan   Plan  27 y.o. G3P2002 at [redacted]w[redacted]d presents for follow-up OB visit. Reviewed prenatal record including previous visit note.  Obesity in pregnancy Last growth ultrasound on 8/8, EFW 51.7%tile, AFI 17.38. Growth ultrasound ordered for next week.   Supervision of other normal pregnancy, antepartum Discussed birth planning. Would like spontaneous labor this time as she has had pitocin with both of her previous births; however, she is open to induction when recommended. Reviewed kick counts and preterm labor warning signs. Instructed to call office or come to hospital with persistent headache, vision changes, regular contractions, leaking of fluid, decreased fetal movement or vaginal bleeding.   Orders Placed This Encounter  Procedures   US OB Follow Up    Standing Status:   Future    Standing Expiration Date:   11/07/2023    Order Specific Question:   Reason for exam:    Answer:   growth ultrasound, BMI >40    Order Specific Question:   Preferred imaging location?    Answer:   [facility]   Return in about 2 weeks (around 08/22/2023) for ROB.   Future Appointments  Date Time Provider Department Center  08/22/2023  2:35 PM Allie Bossier, MD [facility] None    For next visit:  continue with routine prenatal care     Subjective   27 y.o. [id number] at [redacted]w[redacted]d presents for this follow-up prenatal visit.  Patient has no concerns.  Patient reports: Movement: Present Contractions: Not present  Objective   Flow sheet Vitals: Pulse Rate: (!) 110 BP: 122/77 Fundal Height: 33 cm Fetal Heart Rate (bpm): 145 Total weight gain: -6 lb 11.2 oz (-3.039 kg)  General Appearance  No acute distress, well appearing, and well nourished Pulmonary   Normal work of breathing Neurologic   Alert and oriented to person, place, and time Psychiatric   Mood and affect within normal limits  Lindalou Hose Loisann Roach, CNM  09/03/243:03 PM

## 2023-08-16 ENCOUNTER — Ambulatory Visit
Admission: RE | Admit: 2023-08-16 | Discharge: 2023-08-16 | Disposition: A | Discharge status: Home / Self Care | Payer: Medicaid Other | Source: Ambulatory Visit

## 2023-08-16 DIAGNOSIS — O99210 Obesity complicating pregnancy, unspecified trimester: Secondary | ICD-10-CM | POA: Diagnosis present

## 2023-08-20 ENCOUNTER — Other Ambulatory Visit: Payer: Self-pay | Admitting: Licensed Practical Nurse

## 2023-08-20 DIAGNOSIS — O219 Vomiting of pregnancy, unspecified: Secondary | ICD-10-CM

## 2023-08-22 ENCOUNTER — Ambulatory Visit (INDEPENDENT_AMBULATORY_CARE_PROVIDER_SITE_OTHER): Payer: Medicaid Other | Admitting: Obstetrics & Gynecology

## 2023-08-22 VITALS — BP 117/65 | HR 99 | Wt 235.0 lb

## 2023-08-22 DIAGNOSIS — Z23 Encounter for immunization: Secondary | ICD-10-CM

## 2023-08-22 DIAGNOSIS — Z3A35 35 weeks gestation of pregnancy: Secondary | ICD-10-CM

## 2023-08-22 DIAGNOSIS — O99213 Obesity complicating pregnancy, third trimester: Secondary | ICD-10-CM

## 2023-08-22 DIAGNOSIS — O99210 Obesity complicating pregnancy, unspecified trimester: Secondary | ICD-10-CM

## 2023-08-22 DIAGNOSIS — Z3480 Encounter for supervision of other normal pregnancy, unspecified trimester: Secondary | ICD-10-CM

## 2023-08-22 DIAGNOSIS — E669 Obesity, unspecified: Secondary | ICD-10-CM

## 2023-08-22 MED ORDER — ONDANSETRON HCL 4 MG PO TABS
4.0000 mg | ORAL_TABLET | Freq: Three times a day (TID) | ORAL | 0 refills | Status: DC | PRN
Start: 2023-08-22 — End: 2023-09-21

## 2023-08-22 NOTE — Progress Notes (Signed)
   PRENATAL VISIT NOTE  Subjective:  Valerie Garner is a 27 y.o. G3P2002 at [redacted]w[redacted]d being seen today for ongoing prenatal care.  She is currently monitored for the following issues for this high-risk pregnancy and has Obesity in pregnancy and Supervision of other normal pregnancy, antepartum on their problem list.  Patient reports no complaints.  Contractions: Not present. Vag. Bleeding: None.  Movement: Present. Denies leaking of fluid.   The following portions of the patient's history were reviewed and updated as appropriate: allergies, current medications, past family history, past medical history, past social history, past surgical history and problem list.   Objective:   Vitals:   08/22/23 1433  BP: 117/65  Pulse: 99  Weight: 235 lb (106.6 kg)    Fetal Status:     Movement: Present     General:  Alert, oriented and cooperative. Patient is in no acute distress.  Skin: Skin is warm and dry. No rash noted.   Cardiovascular: Normal heart rate noted  Respiratory: Normal respiratory effort, no problems with respiration noted  Abdomen: Soft, gravid, appropriate for gestational age.  Pain/Pressure: Absent     Pelvic: Cervical exam deferred        Extremities: Normal range of motion.     Mental Status: Normal mood and affect. Normal behavior. Normal judgment and thought content.   Assessment and Plan:  Pregnancy: G3P2002 at [redacted]w[redacted]d 1. Encounter for immunization - She declines RSV vaccine as of today - Flu vaccine trivalent PF, 6mos and older(Flulaval,Afluria,Fluarix,Fluzone)  2. Supervision of other normal pregnancy, antepartum   3. Obesity in pregnancy   4. [redacted] weeks gestation of pregnancy   Preterm labor symptoms and general obstetric precautions including but not limited to vaginal bleeding, contractions, leaking of fluid and fetal movement were reviewed in detail with the patient. Please refer to After Visit Summary for other counseling recommendations.   Return  in about 1 week (around 08/29/2023) for NST at next visit.  No future appointments.  Allie Bossier, MD

## 2023-08-29 ENCOUNTER — Ambulatory Visit: Payer: Medicaid Other

## 2023-08-29 ENCOUNTER — Other Ambulatory Visit (HOSPITAL_COMMUNITY)
Admission: RE | Admit: 2023-08-29 | Discharge: 2023-08-29 | Disposition: A | Discharge status: Home / Self Care | Payer: Medicaid Other | Source: Ambulatory Visit | Attending: Certified Nurse Midwife | Admitting: Certified Nurse Midwife

## 2023-08-29 ENCOUNTER — Encounter: Payer: Self-pay | Admitting: Certified Nurse Midwife

## 2023-08-29 ENCOUNTER — Ambulatory Visit (INDEPENDENT_AMBULATORY_CARE_PROVIDER_SITE_OTHER): Payer: Medicaid Other | Admitting: Certified Nurse Midwife

## 2023-08-29 VITALS — BP 126/85 | HR 112 | Ht 64.0 in | Wt 233.8 lb

## 2023-08-29 VITALS — BP 126/85 | HR 112 | Wt 233.8 lb

## 2023-08-29 DIAGNOSIS — Z3A36 36 weeks gestation of pregnancy: Secondary | ICD-10-CM

## 2023-08-29 DIAGNOSIS — O99210 Obesity complicating pregnancy, unspecified trimester: Secondary | ICD-10-CM

## 2023-08-29 DIAGNOSIS — Z3493 Encounter for supervision of normal pregnancy, unspecified, third trimester: Secondary | ICD-10-CM | POA: Insufficient documentation

## 2023-08-29 DIAGNOSIS — Z113 Encounter for screening for infections with a predominantly sexual mode of transmission: Secondary | ICD-10-CM

## 2023-08-29 DIAGNOSIS — Z3685 Encounter for antenatal screening for Streptococcus B: Secondary | ICD-10-CM

## 2023-08-29 NOTE — Progress Notes (Signed)
ROB and NST for elevated BMI.   NST reactive see nurse note. GBS and cultures collected today. No questions or concerns today. She is feeling good movement Leopold's completed, difficult to evaluate due to elevated BMI, feels vertex and pt feel fetal movement upper abdomen. Follow up 1 wk for ROB and NST.   [name], CNM

## 2023-08-29 NOTE — Progress Notes (Signed)
NURSE VISIT NOTE  Subjective:    Patient ID: Valerie Garner, female    DOB: [birth year], 27 y.o.   MRN: [id number]  HPI  Patient is a 27 y.o. G53P2002 female who presents for fetal monitoring per order from Nicholaus Bloom, MD.   Objective:    BP 126/85   Pulse (!) 112   Ht 5\' 4"  (1.626 m)   Wt 233 lb 12.8 oz (106.1 kg)   LMP 12/07/2022 (Exact Date)   BMI 40.13 kg/m  Estimated Date of Delivery: 09/26/23  [redacted]w[redacted]d  Fetus A Non-Stress Test Interpretation for 08/29/23  Indication: Obesity  Fetal Heart Rate A Mode: External Baseline Rate (A): 140 bpm Variability: Moderate Accelerations: 15 x 15 Decelerations: None Multiple birth?: No  Uterine Activity Mode: Toco Contraction Frequency (min): None  Interpretation (Fetal Testing) Nonstress Test Interpretation: Reactive Overall Impression: Reassuring for gestational age   Assessment:   1. Obesity in pregnancy   2. [redacted] weeks gestation of pregnancy      Plan:   Results reviewed and discussed with patient by  [name], CNM.     Rocco Serene, LPN

## 2023-08-29 NOTE — Patient Instructions (Signed)
.  Braxton Hicks Contractions  Contractions of the uterus can occur throughout pregnancy, but they are not always a sign that you are in labor. You may have practice contractions called Braxton Hicks contractions. These false labor contractions are sometimes confused with true labor. What are Braxton Hicks contractions? Braxton Hicks contractions are tightening movements that occur in the muscles of the uterus before labor. Unlike true labor contractions, these contractions do not result in opening (dilation) and thinning of the lowest part of the uterus (cervix). Toward the end of pregnancy (32-34 weeks), Braxton Hicks contractions can happen more often and may become stronger. These contractions are sometimes difficult to tell apart from true labor because they can be very uncomfortable. How to tell the difference between true labor and false labor True labor Contractions last 30-70 seconds. Contractions become very regular. Discomfort is usually felt in the top of the uterus, and it spreads to the lower abdomen and low back. Contractions do not go away with walking. Contractions usually become stronger and more frequent. The cervix dilates and gets thinner. False labor Contractions are usually shorter, weaker, and farther apart than true labor contractions. Contractions are usually irregular. Contractions are often felt in the front of the lower abdomen and in the groin. Contractions may go away when you walk around or change positions while lying down. The cervix usually does not dilate or become thin. Sometimes, the only way to tell if you are in true labor is for your health care provider to look for changes in your cervix. Your health care provider will do a physical exam and may monitor your contractions. If you are in true labor, your health care provider will send you home with instructions about when to return to the hospital. You may continue to have Braxton Hicks contractions until  you go into true labor. Follow these instructions at home:  Take over-the-counter and prescription medicines only as told by your health care provider. If Braxton Hicks contractions are making you uncomfortable: Change your position from lying down or resting to walking, or change from walking to resting. Sit and rest in a tub of warm water. Drink enough fluid to keep your urine pale yellow. Dehydration may cause these contractions. Do slow and deep breathing several times an hour. Keep all follow-up visits. This is important. Contact a health care provider if: You have a fever. You have continuous pain in your abdomen. Your contractions become stronger, more regular, and closer together. You pass blood-tinged mucus. Get help right away if: You have fluid leaking or gushing from your vagina. You have bright red blood coming from your vagina. Your baby is not moving inside you as much as it used to. Summary You may have practice contractions called Braxton Hicks contractions. These false labor contractions are sometimes confused with true labor. Braxton Hicks contractions are usually shorter, weaker, farther apart, and less regular than true labor contractions. True labor contractions usually become stronger, more regular, and more frequent. Manage discomfort from Braxton Hicks contractions by changing position, resting in a warm bath, practicing deep breathing, and drinking plenty of water. Keep all follow-up visits. Contact your health care provider if your contractions become stronger, more regular, and closer together. This information is not intended to replace advice given to you by your health care provider. Make sure you discuss any questions you have with your health care provider. Document Revised: 09/28/2020 Document Reviewed: 09/28/2020 Elsevier Patient Education  2024 Elsevier Inc.  

## 2023-08-31 ENCOUNTER — Encounter: Payer: Self-pay | Admitting: Certified Nurse Midwife

## 2023-08-31 LAB — STREP GP B NAA: Strep Gp B NAA: NEGATIVE

## 2023-08-31 LAB — CERVICOVAGINAL ANCILLARY ONLY
Chlamydia: NEGATIVE
Comment: NEGATIVE
Comment: NORMAL
Neisseria Gonorrhea: NEGATIVE

## 2023-09-06 ENCOUNTER — Encounter: Payer: Self-pay | Admitting: Certified Nurse Midwife

## 2023-09-06 ENCOUNTER — Ambulatory Visit: Payer: Medicaid Other

## 2023-09-06 ENCOUNTER — Ambulatory Visit (INDEPENDENT_AMBULATORY_CARE_PROVIDER_SITE_OTHER): Payer: Medicaid Other | Admitting: Certified Nurse Midwife

## 2023-09-06 VITALS — BP 129/82 | HR 97 | Wt 235.4 lb

## 2023-09-06 VITALS — BP 129/82 | HR 97 | Ht 64.0 in | Wt 235.4 lb

## 2023-09-06 DIAGNOSIS — O99210 Obesity complicating pregnancy, unspecified trimester: Secondary | ICD-10-CM

## 2023-09-06 DIAGNOSIS — O99213 Obesity complicating pregnancy, third trimester: Secondary | ICD-10-CM | POA: Diagnosis not present

## 2023-09-06 DIAGNOSIS — Z3A37 37 weeks gestation of pregnancy: Secondary | ICD-10-CM

## 2023-09-06 DIAGNOSIS — E669 Obesity, unspecified: Secondary | ICD-10-CM

## 2023-09-06 NOTE — Progress Notes (Signed)
ROB doing well, feeling good movement. NST reactive seen nurses note. Pt request SVE. 2.5/60/-2. Pt requesting induction sooner than 40 wks due to last u/s showing growth at 61.9 percentile. She is due for repeat 1 wk. Orders placed. Will re evaluate induction timing after next u/s . Discussed needing to get approval with MD prior to scheduling if before 40 wks. She verbalizes understanding. Follow up 1 wk for ROB and NST.   Pt received the RSV vaccine at 36 wks through [facility].   [name], CNM

## 2023-09-06 NOTE — Patient Instructions (Signed)
.  Braxton Hicks Contractions  Contractions of the uterus can occur throughout pregnancy, but they are not always a sign that you are in labor. You may have practice contractions called Braxton Hicks contractions. These false labor contractions are sometimes confused with true labor. What are Braxton Hicks contractions? Braxton Hicks contractions are tightening movements that occur in the muscles of the uterus before labor. Unlike true labor contractions, these contractions do not result in opening (dilation) and thinning of the lowest part of the uterus (cervix). Toward the end of pregnancy (32-34 weeks), Braxton Hicks contractions can happen more often and may become stronger. These contractions are sometimes difficult to tell apart from true labor because they can be very uncomfortable. How to tell the difference between true labor and false labor True labor Contractions last 30-70 seconds. Contractions become very regular. Discomfort is usually felt in the top of the uterus, and it spreads to the lower abdomen and low back. Contractions do not go away with walking. Contractions usually become stronger and more frequent. The cervix dilates and gets thinner. False labor Contractions are usually shorter, weaker, and farther apart than true labor contractions. Contractions are usually irregular. Contractions are often felt in the front of the lower abdomen and in the groin. Contractions may go away when you walk around or change positions while lying down. The cervix usually does not dilate or become thin. Sometimes, the only way to tell if you are in true labor is for your health care provider to look for changes in your cervix. Your health care provider will do a physical exam and may monitor your contractions. If you are in true labor, your health care provider will send you home with instructions about when to return to the hospital. You may continue to have Braxton Hicks contractions until  you go into true labor. Follow these instructions at home:  Take over-the-counter and prescription medicines only as told by your health care provider. If Braxton Hicks contractions are making you uncomfortable: Change your position from lying down or resting to walking, or change from walking to resting. Sit and rest in a tub of warm water. Drink enough fluid to keep your urine pale yellow. Dehydration may cause these contractions. Do slow and deep breathing several times an hour. Keep all follow-up visits. This is important. Contact a health care provider if: You have a fever. You have continuous pain in your abdomen. Your contractions become stronger, more regular, and closer together. You pass blood-tinged mucus. Get help right away if: You have fluid leaking or gushing from your vagina. You have bright red blood coming from your vagina. Your baby is not moving inside you as much as it used to. Summary You may have practice contractions called Braxton Hicks contractions. These false labor contractions are sometimes confused with true labor. Braxton Hicks contractions are usually shorter, weaker, farther apart, and less regular than true labor contractions. True labor contractions usually become stronger, more regular, and more frequent. Manage discomfort from Braxton Hicks contractions by changing position, resting in a warm bath, practicing deep breathing, and drinking plenty of water. Keep all follow-up visits. Contact your health care provider if your contractions become stronger, more regular, and closer together. This information is not intended to replace advice given to you by your health care provider. Make sure you discuss any questions you have with your health care provider. Document Revised: 09/28/2020 Document Reviewed: 09/28/2020 Elsevier Patient Education  2024 Elsevier Inc.  

## 2023-09-06 NOTE — Progress Notes (Signed)
    NURSE VISIT NOTE  Subjective:    Patient ID: Valerie Garner, female    DOB: 06-06-1996, 27 y.o.   MRN: [id number]  HPI  Patient is a 27 y.o. G48P2002 female who presents for fetal monitoring per order from [name], CNM.   Objective:    BP 129/82   Pulse 97   Ht 5\' 4"  (1.626 m)   Wt 235 lb 6.4 oz (106.8 kg)   LMP 12/07/2022 (Exact Date)   BMI 40.41 kg/m  Estimated Date of Delivery: 09/26/23  [redacted]w[redacted]d  Fetus A Non-Stress Test Interpretation for 09/06/23  Indication: Obesity  Fetal Heart Rate A Mode: External Baseline Rate (A): 125 bpm Variability: Moderate Accelerations: 15 x 15 Decelerations: None Scalp Stimulation: Positive  Uterine Activity Mode: Toco Contraction Frequency (min): None  Interpretation (Fetal Testing) Nonstress Test Interpretation: Reactive Overall Impression: Reassuring for gestational age   Assessment:   1. Obesity affecting pregnancy in third trimester, unspecified obesity type   2. [redacted] weeks gestation of pregnancy      Plan:   Results reviewed and discussed with patient by  [name], CNM.     Rocco Serene, LPN

## 2023-09-07 ENCOUNTER — Encounter: Payer: Self-pay | Admitting: Certified Nurse Midwife

## 2023-09-11 ENCOUNTER — Encounter: Payer: Self-pay | Admitting: Certified Nurse Midwife

## 2023-09-13 ENCOUNTER — Ambulatory Visit
Admission: RE | Admit: 2023-09-13 | Discharge: 2023-09-13 | Disposition: A | Discharge status: Home / Self Care | Payer: Medicaid Other | Source: Ambulatory Visit | Attending: Certified Nurse Midwife | Admitting: Certified Nurse Midwife

## 2023-09-13 ENCOUNTER — Other Ambulatory Visit: Payer: Self-pay | Admitting: Certified Nurse Midwife

## 2023-09-13 ENCOUNTER — Ambulatory Visit: Payer: Medicaid Other | Admitting: Obstetrics and Gynecology

## 2023-09-13 ENCOUNTER — Encounter: Payer: Self-pay | Admitting: Obstetrics and Gynecology

## 2023-09-13 ENCOUNTER — Encounter: Payer: Self-pay | Admitting: Certified Nurse Midwife

## 2023-09-13 ENCOUNTER — Other Ambulatory Visit: Payer: Medicaid Other

## 2023-09-13 ENCOUNTER — Other Ambulatory Visit: Payer: Self-pay

## 2023-09-13 VITALS — BP 105/69 | HR 114 | Wt 233.5 lb

## 2023-09-13 DIAGNOSIS — O99213 Obesity complicating pregnancy, third trimester: Secondary | ICD-10-CM

## 2023-09-13 DIAGNOSIS — Z3A37 37 weeks gestation of pregnancy: Secondary | ICD-10-CM | POA: Diagnosis present

## 2023-09-13 DIAGNOSIS — Z3490 Encounter for supervision of normal pregnancy, unspecified, unspecified trimester: Secondary | ICD-10-CM

## 2023-09-13 DIAGNOSIS — O99210 Obesity complicating pregnancy, unspecified trimester: Secondary | ICD-10-CM | POA: Diagnosis present

## 2023-09-13 DIAGNOSIS — Z3689 Encounter for other specified antenatal screening: Secondary | ICD-10-CM

## 2023-09-13 DIAGNOSIS — Z3483 Encounter for supervision of other normal pregnancy, third trimester: Secondary | ICD-10-CM

## 2023-09-13 DIAGNOSIS — Z3A38 38 weeks gestation of pregnancy: Secondary | ICD-10-CM

## 2023-09-13 LAB — POCT URINALYSIS DIPSTICK OB
Bilirubin, UA: NEGATIVE
Ketones, UA: NEGATIVE
Leukocytes, UA: NEGATIVE
Nitrite, UA: NEGATIVE
Spec Grav, UA: 1.015 (ref 1.010–1.025)
Urobilinogen, UA: 0.2 U/dL
pH, UA: 6 (ref 5.0–8.0)

## 2023-09-13 NOTE — Progress Notes (Unsigned)
ROB [redacted]w[redacted]d: She is doing well. She reports good fetal movement. She been having some contractions on and off. BPP was 8/8.

## 2023-09-13 NOTE — Progress Notes (Unsigned)
ROB: Patient is a 27 y.o. G3P2002 at 109w1d who presents for routine OB care.  Pregnancy is complicated by: Obesity affecting pregnancy in third trimester. Patient has complaints of pelvic pain and pressure. Notes she has contractions, usually at night, but then go away. Discussed comfort  measures. Discussed delivery planning. Based on BMI, discussed recommendations regarding post-dates pregnancies and slight increased risk of stillbirth with higher BMIs. Patient notes understanding. States that she would prefer not to be induced, but also does not desire to go past her due date as well. Would like to go into labor naturally if possible. Notes she would like to try other natural things. Discussed natural cervical ripening methods, membrane sweeping, which patient is amenable to. Also ok to schedule an IOL towards the end of 39 weeks closer to her due date. Orders placed. Will schedule for

## 2023-09-19 DIAGNOSIS — Z3A39 39 weeks gestation of pregnancy: Secondary | ICD-10-CM | POA: Insufficient documentation

## 2023-09-19 NOTE — Progress Notes (Deleted)
    NURSE VISIT NOTE  Subjective:    Patient ID: Valerie Garner, female    DOB: 1995-12-31, 27 y.o.   MRN: [id number]  HPI  Patient is a 27 y.o. G56P2002 female who presents for fetal monitoring per order from Hildred Laser, MD.   Objective:    LMP 12/07/2022 (Exact Date)  Estimated Date of Delivery: 09/26/23  [redacted]w[redacted]d  Fetus A Non-Stress Test Interpretation for 09/19/23  Indication: Obesity            Assessment:   1. Obesity affecting pregnancy in third trimester, unspecified obesity type   2. [redacted] weeks gestation of pregnancy      Plan:   Results reviewed and discussed with patient by  Valerie Garner, CNM.     Rocco Serene, LPN

## 2023-09-20 ENCOUNTER — Other Ambulatory Visit: Payer: Self-pay

## 2023-09-20 ENCOUNTER — Inpatient Hospital Stay: Payer: Medicaid Other | Admitting: Anesthesiology

## 2023-09-20 ENCOUNTER — Encounter: Payer: Self-pay | Admitting: Obstetrics and Gynecology

## 2023-09-20 ENCOUNTER — Other Ambulatory Visit: Payer: Medicaid Other

## 2023-09-20 ENCOUNTER — Inpatient Hospital Stay
Admission: EM | Admit: 2023-09-20 | Discharge: 2023-09-21 | DRG: 807 | Disposition: A | Discharge status: Home / Self Care | Payer: Medicaid Other | Attending: Certified Nurse Midwife | Admitting: Certified Nurse Midwife

## 2023-09-20 ENCOUNTER — Encounter: Payer: Medicaid Other | Admitting: Certified Nurse Midwife

## 2023-09-20 DIAGNOSIS — Z3490 Encounter for supervision of normal pregnancy, unspecified, unspecified trimester: Secondary | ICD-10-CM

## 2023-09-20 DIAGNOSIS — O26893 Other specified pregnancy related conditions, third trimester: Secondary | ICD-10-CM | POA: Diagnosis not present

## 2023-09-20 DIAGNOSIS — Z3A39 39 weeks gestation of pregnancy: Secondary | ICD-10-CM

## 2023-09-20 DIAGNOSIS — O99213 Obesity complicating pregnancy, third trimester: Principal | ICD-10-CM | POA: Diagnosis present

## 2023-09-20 DIAGNOSIS — O479 False labor, unspecified: Secondary | ICD-10-CM

## 2023-09-20 DIAGNOSIS — Z833 Family history of diabetes mellitus: Secondary | ICD-10-CM | POA: Diagnosis not present

## 2023-09-20 DIAGNOSIS — Z806 Family history of leukemia: Secondary | ICD-10-CM

## 2023-09-20 DIAGNOSIS — O99214 Obesity complicating childbirth: Principal | ICD-10-CM | POA: Diagnosis present

## 2023-09-20 DIAGNOSIS — E669 Obesity, unspecified: Secondary | ICD-10-CM | POA: Diagnosis not present

## 2023-09-20 LAB — CBC
HCT: 35 % — ABNORMAL LOW (ref 36.0–46.0)
Hemoglobin: 12.1 g/dL (ref 12.0–15.0)
MCH: 30.4 pg (ref 26.0–34.0)
MCHC: 34.6 g/dL (ref 30.0–36.0)
MCV: 87.9 fL (ref 80.0–100.0)
Platelets: 245 10*3/uL (ref 150–400)
RBC: 3.98 MIL/uL (ref 3.87–5.11)
RDW: 12.7 % (ref 11.5–15.5)
WBC: 12.9 10*3/uL — ABNORMAL HIGH (ref 4.0–10.5)
nRBC: 0 % (ref 0.0–0.2)

## 2023-09-20 LAB — TYPE AND SCREEN
ABO/RH(D): A POS
Antibody Screen: NEGATIVE

## 2023-09-20 MED ORDER — EPHEDRINE 5 MG/ML INJ
10.0000 mg | INTRAVENOUS | Status: DC | PRN
  Administered 2023-09-20: 10 mg via INTRAVENOUS
  Filled 2023-09-20: qty 5

## 2023-09-20 MED ORDER — SIMETHICONE 80 MG PO CHEW: 80.0000 mg | CHEWABLE_TABLET | ORAL | Status: DC | PRN

## 2023-09-20 MED ORDER — DIPHENHYDRAMINE HCL 50 MG/ML IJ SOLN: 12.5000 mg | INTRAMUSCULAR | Status: DC | PRN

## 2023-09-20 MED ORDER — ONDANSETRON HCL 4 MG PO TABS: 4.0000 mg | ORAL_TABLET | ORAL | Status: DC | PRN

## 2023-09-20 MED ORDER — MISOPROSTOL 200 MCG PO TABS
ORAL_TABLET | ORAL | Status: AC
  Filled 2023-09-20: qty 4

## 2023-09-20 MED ORDER — FENTANYL-BUPIVACAINE-NACL 0.5-0.125-0.9 MG/250ML-% EP SOLN
12.0000 mL/h | EPIDURAL | Status: DC | PRN
  Administered 2023-09-20: 12 mL/h via EPIDURAL

## 2023-09-20 MED ORDER — EPHEDRINE 5 MG/ML INJ
10.0000 mg | INTRAVENOUS | Status: DC | PRN
  Administered 2023-09-20: 10 mg via INTRAVENOUS

## 2023-09-20 MED ORDER — ONDANSETRON HCL 4 MG/2ML IJ SOLN: 4.0000 mg | INTRAMUSCULAR | Status: DC | PRN

## 2023-09-20 MED ORDER — OXYTOCIN-SODIUM CHLORIDE 30-0.9 UT/500ML-% IV SOLN: 1.0000 m[IU]/min | INTRAVENOUS | Status: DC

## 2023-09-20 MED ORDER — TERBUTALINE SULFATE 1 MG/ML IJ SOLN: 0.2500 mg | Freq: Once | INTRAMUSCULAR | Status: DC | PRN

## 2023-09-20 MED ORDER — ONDANSETRON HCL 4 MG/2ML IJ SOLN: 4.0000 mg | Freq: Four times a day (QID) | INTRAMUSCULAR | Status: DC | PRN

## 2023-09-20 MED ORDER — DIPHENHYDRAMINE HCL 25 MG PO CAPS: 25.0000 mg | ORAL_CAPSULE | Freq: Four times a day (QID) | ORAL | Status: DC | PRN

## 2023-09-20 MED ORDER — FENTANYL-BUPIVACAINE-NACL 0.5-0.125-0.9 MG/250ML-% EP SOLN
EPIDURAL | Status: AC
  Filled 2023-09-20: qty 250

## 2023-09-20 MED ORDER — PHENYLEPHRINE 80 MCG/ML (10ML) SYRINGE FOR IV PUSH (FOR BLOOD PRESSURE SUPPORT): 80.0000 ug | PREFILLED_SYRINGE | INTRAVENOUS | Status: DC | PRN

## 2023-09-20 MED ORDER — OXYCODONE-ACETAMINOPHEN 5-325 MG PO TABS: 2.0000 | ORAL_TABLET | ORAL | Status: DC | PRN

## 2023-09-20 MED ORDER — OXYTOCIN 10 UNIT/ML IJ SOLN
INTRAMUSCULAR | Status: AC
  Filled 2023-09-20: qty 1

## 2023-09-20 MED ORDER — WITCH HAZEL-GLYCERIN EX PADS
1.0000 | MEDICATED_PAD | CUTANEOUS | Status: DC | PRN
  Filled 2023-09-20: qty 100

## 2023-09-20 MED ORDER — COCONUT OIL OIL
1.0000 | TOPICAL_OIL | Status: DC | PRN
  Filled 2023-09-20: qty 7.5

## 2023-09-20 MED ORDER — DIBUCAINE (PERIANAL) 1 % EX OINT
1.0000 | TOPICAL_OINTMENT | CUTANEOUS | Status: DC | PRN
  Filled 2023-09-20: qty 28

## 2023-09-20 MED ORDER — LIDOCAINE-EPINEPHRINE (PF) 1.5 %-1:200000 IJ SOLN
INTRAMUSCULAR | Status: DC | PRN
  Administered 2023-09-20: 3 mL via EPIDURAL

## 2023-09-20 MED ORDER — IBUPROFEN 600 MG PO TABS
600.0000 mg | ORAL_TABLET | Freq: Four times a day (QID) | ORAL | Status: DC
  Administered 2023-09-20 – 2023-09-21 (×3): 600 mg via ORAL
  Filled 2023-09-20 (×3): qty 1

## 2023-09-20 MED ORDER — ACETAMINOPHEN 325 MG PO TABS: 650.0000 mg | ORAL_TABLET | ORAL | Status: DC | PRN

## 2023-09-20 MED ORDER — SOD CITRATE-CITRIC ACID 500-334 MG/5ML PO SOLN: 30.0000 mL | ORAL | Status: DC | PRN

## 2023-09-20 MED ORDER — ZOLPIDEM TARTRATE 5 MG PO TABS: 5.0000 mg | ORAL_TABLET | Freq: Every evening | ORAL | Status: DC | PRN

## 2023-09-20 MED ORDER — SODIUM CHLORIDE 0.9% FLUSH: 10.0000 mL | Freq: Two times a day (BID) | INTRAVENOUS | Status: DC

## 2023-09-20 MED ORDER — BUPIVACAINE HCL (PF) 0.25 % IJ SOLN
INTRAMUSCULAR | Status: DC | PRN
  Administered 2023-09-20: 8 mL via EPIDURAL

## 2023-09-20 MED ORDER — OXYTOCIN-SODIUM CHLORIDE 30-0.9 UT/500ML-% IV SOLN
2.5000 [IU]/h | INTRAVENOUS | Status: DC
  Administered 2023-09-20: 2.5 [IU]/h via INTRAVENOUS
  Filled 2023-09-20: qty 500

## 2023-09-20 MED ORDER — FENTANYL CITRATE (PF) 100 MCG/2ML IJ SOLN: 50.0000 ug | INTRAMUSCULAR | Status: DC | PRN

## 2023-09-20 MED ORDER — PRENATAL MULTIVITAMIN CH
1.0000 | ORAL_TABLET | Freq: Every day | ORAL | Status: DC
  Administered 2023-09-21: 1 via ORAL
  Filled 2023-09-20: qty 1

## 2023-09-20 MED ORDER — LACTATED RINGERS IV SOLN
500.0000 mL | INTRAVENOUS | Status: DC | PRN
  Administered 2023-09-20 (×2): 1000 mL via INTRAVENOUS

## 2023-09-20 MED ORDER — LIDOCAINE HCL (PF) 1 % IJ SOLN
30.0000 mL | INTRAMUSCULAR | Status: DC | PRN
  Filled 2023-09-20: qty 30

## 2023-09-20 MED ORDER — ACETAMINOPHEN 500 MG PO TABS
1000.0000 mg | ORAL_TABLET | Freq: Four times a day (QID) | ORAL | Status: DC
  Administered 2023-09-20 – 2023-09-21 (×4): 1000 mg via ORAL
  Filled 2023-09-20 (×4): qty 2

## 2023-09-20 MED ORDER — OXYCODONE-ACETAMINOPHEN 5-325 MG PO TABS: 1.0000 | ORAL_TABLET | ORAL | Status: DC | PRN

## 2023-09-20 MED ORDER — MISOPROSTOL 50MCG HALF TABLET: 50.0000 ug | ORAL_TABLET | ORAL | Status: DC | PRN

## 2023-09-20 MED ORDER — OXYTOCIN BOLUS FROM INFUSION
333.0000 mL | Freq: Once | INTRAVENOUS | Status: AC
  Administered 2023-09-20: 333 mL via INTRAVENOUS

## 2023-09-20 MED ORDER — AMMONIA AROMATIC IN INHA
RESPIRATORY_TRACT | Status: AC
  Filled 2023-09-20: qty 10

## 2023-09-20 MED ORDER — BENZOCAINE-MENTHOL 20-0.5 % EX AERO
1.0000 | INHALATION_SPRAY | CUTANEOUS | Status: DC | PRN
  Filled 2023-09-20: qty 56

## 2023-09-20 MED ORDER — DOCUSATE SODIUM 100 MG PO CAPS
100.0000 mg | ORAL_CAPSULE | Freq: Two times a day (BID) | ORAL | Status: DC
  Administered 2023-09-20 – 2023-09-21 (×2): 100 mg via ORAL
  Filled 2023-09-20 (×2): qty 1

## 2023-09-20 NOTE — Plan of Care (Signed)
Problem: Education: Goal: Knowledge of Childbirth will improve Outcome: Completed/Met Goal: Ability to make informed decisions regarding treatment and plan of care will improve Outcome: Completed/Met Goal: Ability to state and carry out methods to decrease the pain will improve Outcome: Completed/Met   Problem: Coping: Goal: Ability to verbalize concerns and feelings about labor and delivery will improve Outcome: Completed/Met   Problem: Life Cycle: Goal: Ability to make normal progression through stages of labor will improve Outcome: Completed/Met Goal: Ability to effectively push during vaginal delivery will improve Outcome: Completed/Met   Problem: Role Relationship: Goal: Will demonstrate positive interactions with the child Outcome: Completed/Met   Problem: Safety: Goal: Risk of complications during labor and delivery will decrease Outcome: Completed/Met   Problem: Pain Management: Goal: Relief or control of pain from uterine contractions will improve Outcome: Completed/Met

## 2023-09-20 NOTE — Progress Notes (Deleted)
Note opened in error Carie Caddy, CNM   [facility]  09/20/2023 2:54 PM

## 2023-09-20 NOTE — Anesthesia Procedure Notes (Signed)
Epidural Patient location during procedure: OB Start time: 09/20/2023 3:13 PM End time: 09/20/2023 3:19 PM  Staffing Anesthesiologist: Reed Breech, MD Performed: anesthesiologist   Preanesthetic Checklist Completed: patient identified, IV checked, risks and benefits discussed, surgical consent, monitors and equipment checked, pre-op evaluation and timeout performed  Epidural Patient position: sitting Prep: Betadine Patient monitoring: heart rate, continuous pulse ox and blood pressure Approach: midline Location: L3-L4 Injection technique: LOR air  Needle:  Needle type: Tuohy  Needle gauge: 17 G Needle length: 9 cm Needle insertion depth: 6.5 cm Catheter at skin depth: 11.5 cm Test dose: negative  Assessment Sensory level: T4  Additional Notes Straightforward placement without apparent complications. Reason for block:procedure for pain

## 2023-09-20 NOTE — Discharge Summary (Signed)
Obstetrical Discharge Summary  Date of Admission: 09/20/2023 Date of Discharge: 09/21/2023  Primary OB: [facility]  Gestational Age at Delivery: [redacted]w[redacted]d   Antepartum complications: none Reason for Admission: Labor Managment Date of Delivery: 09/20/2023  Delivered By: Cindra Eves, SNM and Carie Caddy, CNM Delivery Type: spontaneous vaginal delivery Intrapartum complications/course: None Anesthesia: epidural Placenta: Delivered and expressed via active management. Intact: yes. To pathology: no.  Laceration: none Episiotomy: none EBL: 250 mL Baby: Liveborn female, APGARs 8/9, weight 3550 g.    Discharge Diagnosis: Delivered.   Postpartum course: Normal  Discharge Vital Signs:  Current Vital Signs 24h Vital Sign Ranges  T (!) 97.5 F (36.4 C) Temp  Avg: 98.2 F (36.8 C)  Min: 97.5 F (36.4 C)  Max: 98.8 F (37.1 C)  BP 120/81 BP  Min: 67/36  Max: 131/58  HR 86 Pulse  Avg: 113.4  Min: 86  Max: 156  RR 18 Resp  Avg: 17.7  Min: 16  Max: 20  SaO2 100 % Room Air SpO2  Avg: 98.8 %  Min: 96 %  Max: 100 %       24 Hour I/O Current Shift I/O  Time Ins Outs 10/16 0701 - 10/17 0700 In: -  Out: 700 [Urine:300] No intake/output data recorded.     Patient Vitals for the past 6 hrs:  BP Temp Temp src Pulse Resp SpO2  09/21/23 0747 120/81 (!) 97.5 F (36.4 C) Oral 86 18 100 %  09/21/23 0415 115/85 98.1 F (36.7 C) Oral 93 20 100 %    Discharge Exam:  NAD Perineum: intact  Abdomen: firm fundus below the umbilicus, NTTP, non distended, +bowel sounds.   RRR no MRGs CTAB Ext: no c/c/e  Recent Labs  Lab 09/20/23 0950 09/21/23 0616  WBC 12.9* 12.7*  HGB 12.1 10.3*  HCT 35.0* 30.6*  PLT 245 207    Disposition: Home  Rh Immune globulin given: not applicable Rubella vaccine given: not applicable Tdap vaccine given in AP or PP setting: yes 06/27/2023 Flu vaccine given in AP or PP setting: yes 08/22/2023  Contraception:  Nexplanon  Prenatal/Postnatal Panel: A  POS//Rubella Immune//Varicella Immune//RPR negative//HIV negative/HepB Surface Ag negative//pap no abnormalities (date: 05/04/2022)//plans to breastfeed  Plan:  Valerie Garner was discharged to home in good condition. Follow-up appointment with [facility] (LD) in 2 week for a virtual visit, 6 weeks for in person visit.   No future appointments.  Discharge Medications: Allergies as of 09/21/2023   No Known Allergies      Medication List     STOP taking these medications    aspirin EC 81 MG tablet   ondansetron 4 MG tablet Commonly known as: Zofran       TAKE these medications    PRENATAL PO Take by mouth.        [name], CNM  [facility]

## 2023-09-20 NOTE — Progress Notes (Signed)
Labor Progress Note   ASSESSMENT/PLAN   Valerie Garner 27 y.o.   [id number]  at [redacted]w[redacted]d here with labor.  FWB:  - Baseline 125   Variability: Moderate    Accels: Present     Decels: Absent              She reports feeling good movement.   GBS: - GBS negative   LABOR: - Cervical exam unchanged, doing well. - Pain Management: desires epidural  - Discussed augmentation options with patient and she desires epidural for labor coping followed by AROM - Anticipate SVD   Principal Problem:   Obesity complicating pregnancy in third trimester   SUBJECTIVE/OBJECTIVE   SUBJECTIVE: Valerie Garner is doing well, up in room ad lib. Coping well with contractions, but reports she is feeling tired.   OBJECTIVE: Vital Signs: Patient Vitals for the past 12 hrs:  BP Temp Temp src Pulse Resp Height Weight  09/20/23 1435 -- 98.3 F (36.8 C) Oral -- 16 -- --  09/20/23 0828 127/79 98.1 F (36.7 C) Oral 99 16 5\' 4"  (1.626 m) 105.7 kg    Last SVE:  Dilation: 5.5 Effacement (%): 60 Cervical Position: Middle Station: -1 Presentation: Vertex Exam by:: R. Mora Pedraza, SCNM   FHR:   - Mode: External  - Baseline Rate (A): 135 bpm  -    - Characteristics (ie - accels, decels): Accelerations: 15 x 15  -    UTERINE ACTIVITY:   - Mode: Toco, palpation  - Contraction Frequency: 3-5

## 2023-09-20 NOTE — OB Triage Note (Signed)
Presents with complaint of contractions throughout night. States some bloody discharge. Denies leaking of fluid. EFMs applied

## 2023-09-20 NOTE — Progress Notes (Signed)
Labor Progress Note   ASSESSMENT/PLAN   Valerie Garner 27 y.o.   [id number]  at [redacted]w[redacted]d here for labor management.  FWB:  - Fetal well being assessed: Cat I       GBS: - GBS negative  LABOR: - Now in active labor, doing well. - Pain Management: epidural in place - Discussed AROM, [name] is in agreement.  - Anticipate SVD   Principal Problem:   Obesity complicating pregnancy in third trimester   SUBJECTIVE/OBJECTIVE   SUBJECTIVE:   Resting comfortably with epidural in place. Slight show noted when performing vaginal exam.   OBJECTIVE: Vital Signs: Patient Vitals for the past 12 hrs:  BP Temp Temp src Pulse Resp SpO2 Height Weight  09/20/23 1653 98/64 -- -- (!) 123 -- -- -- --  09/20/23 1642 (!) 112/59 -- -- (!) 105 -- -- -- --  09/20/23 1638 116/70 -- -- (!) 122 -- -- -- --  09/20/23 1636 (!) 104/58 -- -- (!) 156 -- -- -- --  09/20/23 1635 (!) 68/44 -- -- (!) 115 -- 100 % -- --  09/20/23 1630 (!) 83/41 -- -- (!) 117 -- 100 % -- --  09/20/23 1522 128/72 -- -- 99 -- -- -- --  09/20/23 1520 129/77 -- -- 99 -- 98 % -- --  09/20/23 1435 -- 98.3 F (36.8 C) Oral -- 16 -- -- --  09/20/23 0828 127/79 98.1 F (36.7 C) Oral 99 16 -- 5\' 4"  (1.626 m) 105.7 kg    Last SVE:  Dilation: 6 Effacement (%): 70 Cervical Position: Middle Station: -1 Presentation: Vertex Exam by:: R. Haylee Mcanany, SCNM -  , Rupture Date: 09/20/23, Rupture Time: 1635,    FHR:   - Mode: External  - Baseline Rate (A): 125 bpm  -  Moderate variability  - Characteristics (ie - accels, decels): Accelerations: 15 x 15  -  No decels   UTERINE ACTIVITY:   - Mode: Toco  - Contraction Frequency (min): 4 minutes

## 2023-09-20 NOTE — Anesthesia Preprocedure Evaluation (Addendum)
Anesthesia Evaluation  Patient identified by MRN, date of birth, ID band Patient awake    Reviewed: Allergy & Precautions, NPO status , Patient's Chart, lab work & pertinent test results  History of Anesthesia Complications Negative for: history of anesthetic complications  Airway Mallampati: III   Neck ROM: Full    Dental   Pulmonary neg pulmonary ROS   Pulmonary exam normal breath sounds clear to auscultation       Cardiovascular Exercise Tolerance: Good negative cardio ROS Normal cardiovascular exam Rhythm:Regular Rate:Normal     Neuro/Psych negative neurological ROS     GI/Hepatic negative GI ROS,,,  Endo/Other  negative endocrine ROS  Obesity   Renal/GU negative Renal ROS     Musculoskeletal   Abdominal   Peds  Hematology negative hematology ROS (+)   Anesthesia Other Findings 27 yo G3P2002 at 64 1/7 requesting labor epidural.  Reproductive/Obstetrics                             Anesthesia Physical Anesthesia Plan  ASA: 2  Anesthesia Plan: Epidural   Post-op Pain Management:    Induction:   PONV Risk Score and Plan: 2 and Treatment may vary due to age or medical condition  Airway Management Planned: Natural Airway  Additional Equipment:   Intra-op Plan:   Post-operative Plan:   Informed Consent: I have reviewed the patients History and Physical, chart, labs and discussed the procedure including the risks, benefits and alternatives for the proposed anesthesia with the patient or authorized representative who has indicated his/her understanding and acceptance.     Dental Advisory Given  Plan Discussed with:   Anesthesia Plan Comments: (Patient reports no bleeding problems and no anticoagulant use.   Patient consented for risks of anesthesia including but not limited to:  - adverse reactions to medications - risk of bleeding, infection and or nerve damage from  epidural that could lead to paralysis - risk of headache or failed epidural - nerve damage due to positioning - that if epidural is used for C-section that there is a chance of epidural failure requiring spinal placement or conversion to GA - damage to heart, brain, lungs, other parts of body or loss of life  Patient voiced understanding and assent.)        Anesthesia Quick Evaluation

## 2023-09-21 LAB — CBC
HCT: 30.6 % — ABNORMAL LOW (ref 36.0–46.0)
Hemoglobin: 10.3 g/dL — ABNORMAL LOW (ref 12.0–15.0)
MCH: 29.9 pg (ref 26.0–34.0)
MCHC: 33.7 g/dL (ref 30.0–36.0)
MCV: 89 fL (ref 80.0–100.0)
Platelets: 207 10*3/uL (ref 150–400)
RBC: 3.44 MIL/uL — ABNORMAL LOW (ref 3.87–5.11)
RDW: 12.8 % (ref 11.5–15.5)
WBC: 12.7 10*3/uL — ABNORMAL HIGH (ref 4.0–10.5)
nRBC: 0 % (ref 0.0–0.2)

## 2023-09-21 LAB — RPR: RPR Ser Ql: NONREACTIVE

## 2023-09-21 NOTE — Discharge Instructions (Signed)
Discharge Instructions:   Follow-up Appointment:  If there are any new medications, they have been ordered and will be available for pickup at the listed pharmacy on your way home from the hospital.   Call office if you have any of the following: headache, visual changes, fever >101.0 F, chills, shortness of breath, breast concerns, excessive vaginal bleeding, incision drainage or problems, leg pain or redness, depression or any other concerns. If you have vaginal discharge with an odor, let your doctor know.   It is normal to bleed for up to 6 weeks. You should not soak through more than 1 pad in 1 hour. If you have a blood clot larger than your fist with continued bleeding, call your doctor.   Activity: Do not lift > 10 lbs for 6 weeks (do not lift anything heavier than your baby). No intercourse, tampons, swimming pools, hot tubs, baths (only showers) for 6 weeks.  No driving for 1-2 weeks. Continue prenatal vitamin, especially if breastfeeding. Increase calories and fluids (water) while breastfeeding.   Your milk will come in, in the next couple of days (right now it is colostrum). You may have a slight fever when your milk comes in, but it should go away on its own.  If it does not, and rises above 101 F please call the doctor. You will also feel achy and your breasts will be firm. They will also start to leak. If you are breastfeeding, continue as you have been and you can pump/express milk for comfort.   If you have too much milk, your breasts can become engorged, which could lead to mastitis. This is an infection of the milk ducts. It can be very painful and you will need to notify your doctor to obtain a prescription for antibiotics. You can also treat it with a shower or hot/cold compress.   For concerns about your baby, please call your pediatrician.  For breastfeeding concerns, the lactation consultant can be reached at [phone].   Postpartum blues (feelings of happy one minute  and sad another minute) are normal for the first few weeks but if it gets worse let your doctor know.   Congratulations! We enjoyed caring for you and your new bundle of joy!

## 2023-09-21 NOTE — Progress Notes (Signed)
Patient discharged home with family.  Discharge instructions, when to follow up, and medications reviewed with patient.  Patient verbalized understanding. Patient will be escorted to car for discharge by RN. Care relinquished.

## 2023-09-21 NOTE — Anesthesia Postprocedure Evaluation (Signed)
Anesthesia Post Note  Patient: [name] [facility]  Procedure(s) Performed: AN AD HOC LABOR EPIDURAL  Patient location during evaluation: Mother Baby Anesthesia Type: Epidural Level of consciousness: awake and alert Pain management: pain level controlled Vital Signs Assessment: post-procedure vital signs reviewed and stable Respiratory status: spontaneous breathing, nonlabored ventilation and respiratory function stable Cardiovascular status: stable Postop Assessment: no headache, no backache and epidural receding Anesthetic complications: no  No notable events documented.   Last Vitals:  Vitals:   09/21/23 0415 09/21/23 0747  BP: 115/85 120/81  Pulse: 93 86  Resp: 20 18  Temp: 36.7 C (!) 36.4 C  SpO2: 100% 100%    Last Pain:  Vitals:   09/21/23 0805  TempSrc:   PainSc: 1                  Rector Devonshire,  Alessandra Bevels

## 2023-09-21 NOTE — H&P (Signed)
Valerie Garner Valerie Garner is a 27 y.o. female at [redacted]w[redacted]d based on L and 9wk [place] presenting for labor evaluation. Contractions started last night. She was able to rest some. Now they are more intense and more frequent. Endorses FM. Denies LOF/VB.  Valerie Garner has had early and regular prenatal care. Her pregnancy has been uncomplicated.   OB History     Gravida  3   Para  3   Term  3   Preterm      AB      Living  3      SAB      IAB      Ectopic      Multiple  0   Live Births  3          Past Medical History:  Diagnosis Date   Postpartum depression associated with first pregnancy    Past Surgical History:  Procedure Laterality Date   adenoidectomy     TONSILLECTOMY Bilateral    WISDOM TOOTH EXTRACTION     Family History: family history includes Diabetes in her paternal grandfather; Healthy in her father and mother; Leukemia in her brother. Social History:  reports that she has never smoked. She has never used smokeless tobacco. She reports that she does not currently use alcohol. She reports that she does not use drugs.     Maternal Diabetes: No elevated 1 hour, normal 3 hour  Genetic Screening: Normal Maternal Ultrasounds/Referrals: Normal Fetal Ultrasounds or other Referrals:  None Maternal Substance Abuse:  No Significant Maternal Medications:  None Significant Maternal Lab Results:  Group B Strep negative Number of Prenatal Visits:greater than 3 verified prenatal visits Maternal Vaccinations:TDap and Flu Other Comments:  None  Review of Systems see HPI  History SVE 5/60/-2 Blood pressure 116/72, pulse 84, temperature 98.2 F (36.8 C), temperature source Oral, resp. rate 18, height 5\' 4"  (1.626 m), weight 105.7 kg, last menstrual period 12/07/2022, SpO2 99%, unknown if currently breastfeeding. Exam Physical Exam Constitutional:      Appearance: Normal appearance.  Cardiovascular:     Rate and Rhythm: Normal rate.  Pulmonary:     Effort: Pulmonary  effort is normal.  Abdominal:     Tenderness: There is no abdominal tenderness.     Comments: Gravid, EFW 7.5lbs  Musculoskeletal:     Right lower leg: No edema.     Left lower leg: No edema.  Neurological:     General: No focal deficit present.     Mental Status: She is alert.  Psychiatric:        Mood and Affect: Mood normal.    ZOX:WRUEAVWU 135, moderate variability, pos accel, neg decel  TOCO: q 4 Prenatal labs: ABO, Rh: --/--/A POS (10/16 9811) Antibody: NEG (10/16 0949) Rubella: 3.44 (03/26 1428) RPR: NON REACTIVE (10/16 0950)  HBsAg: Negative (03/26 1428)  HIV: Non Reactive (07/23 1550)  GBS: Negative/-- (09/24 1628)   Assessment/Plan: [id number] at [redacted]w[redacted]d in early active labor  -Admit, labs ordered -Expectant management, augmentation if desired -GBS neg, membranes intact -category I tracing -Pain management: planning epidural   Dr Logan Bores aware of admission and plan   [name] [facility] 09/21/2023, 4:04 PM

## 2023-09-21 NOTE — Final Progress Note (Signed)
Final Progress Note  Patient ID: Valerie Garner MRN: [id number] DOB/AGE: 08-09-1996 27 y.o.  Admit date: 09/20/2023 Admitting provider: Hildred Laser, MD Discharge date: 09/21/2023   Admission Diagnoses: Labor and delivery indication for care  Discharge Diagnoses:  Principal Problem:   Obesity complicating pregnancy in third trimester Active Problems:   Uterine contractions    Consults: None  Significant Findings/ Diagnostic Studies: labs: gbs negative  Procedures: Epidural  Discharge Condition: good  Disposition: Discharge disposition: 01-Home or Self Care       Diet: Regular diet  Discharge Activity: Activity as tolerated and No lifting, driving, or strenuous exercise for 1 week   Allergies as of 09/21/2023   No Known Allergies      Medication List     STOP taking these medications    aspirin EC 81 MG tablet   ondansetron 4 MG tablet Commonly known as: Zofran       TAKE these medications    PRENATAL PO Take by mouth.        Follow-up Information     [name], [name], CNM. Schedule an appointment as soon as possible for a visit in 2 week(s).   Specialty: Obstetrics and Gynecology Why: 2 week virtual 6 week in person Contact information: [address] [place] [address] [phone]                 Total time spent taking care of this patient: 12 minutes  Signed: [name] 09/21/2023, 8:53 AM

## 2023-09-21 NOTE — Lactation Note (Signed)
This note was copied from a baby's chart. Lactation Consultation Note  Patient Name: Valerie Garner Today's Date: 09/21/2023 Age:26 hours     Maternal Data Lactation to room for an initial assessment w/ a P3 patient and and 15hr old baby Valerie.  Upon entry into room parents had room full of guest.  Lactation will return later.  1528 LC returned to complete an initial assessment w/ 20 hr old baby Valerie.  Patient stated that feedings are going very well compared to her other children.  Patient did not have very positive breastfeeding experiences with her other children.    Mom does have a pump at home.  Feeding No feeding observed during this vist.  Interventions LC provided education on the following;  milk production expectations, hunger cues, day 1/2 wet/dirty diapers, hand expression, cluster feeding, benefits of STS and arousing infant for a feeding.  Lactation informed patient of feeding infant at least 8 or more times w/in a 24hr period but not exceeding 3hrs. Patient verbalized understanding.      LC provided ideas to dad on to help support mom once home this included; changing diapers, feeding infant once mom starts pumping, providing support pillows, water for hydration, you are doing good words of wisdom. FOB verbalized understanding.   Discharge Education on engorgement prevention/treatment was discussed as well as breastmilk storage guidelines.  LC provided patient with a handout on breastmilk storage guidelines from [facility]. [facility] outpatient lactation services phone number written on the white board in the room.  Patient verbalized understanding   LC provided RN w/ a report .     Yvette Rack Free 09/21/2023, 4:34 PM

## 2023-09-28 DIAGNOSIS — Z3490 Encounter for supervision of normal pregnancy, unspecified, unspecified trimester: Secondary | ICD-10-CM

## 2023-10-03 ENCOUNTER — Telehealth: Payer: Medicaid Other | Admitting: Licensed Practical Nurse

## 2023-10-03 DIAGNOSIS — Z1332 Encounter for screening for maternal depression: Secondary | ICD-10-CM | POA: Diagnosis not present

## 2023-10-03 NOTE — Progress Notes (Signed)
Virtual Visit via Video Note  I connected with Valerie Garner on 10/03/23 at 10:55 AM EDT by a video enabled telemedicine application and verified that I am speaking with the correct person using two identifiers.  Location: Patient: home in [place], [place] Provider: office in [place]   I discussed the limitations of evaluation and management by telemedicine and the availability of in person appointments. The patient expressed understanding and agreed to proceed.  History of Present Illness: 09/21/23 SVB w/ LMD and RW SNM, intact, Valerie Garner 3550 grams   -Bleeding: like a light period -Appetite is increased -Sleep as expected with a NB, 6-8hours n 24 hours -no concerns with voiding, stooling, or perineum -Mood overall good, has had some ups and downs -Other children ages 35 and 2 are in love with their new sister -Breastfeeding is going well, no concerns -Unsure about another pregnancy, would like a break. Desires Nexplanon, had prolonged bleeding with this last time she use dit, it resolved with a pack of pills. Prefers nexplanon to an IUD  -trying to get out some     Observations/Objective: Gen: NAD EPDS 2   Assessment and Plan: Normal 2 wkk PP Screening for Maternal Depression   Follow Up Instructions: -RTC in 4 wks for PP exam and Nexplanon insertion -Pap due 2026 -may start to increase physical activity    I discussed the assessment and treatment plan with the patient. The patient was provided an opportunity to ask questions and all were answered. The patient agreed with the plan and demonstrated an understanding of the instructions.   The patient was advised to call back or seek an in-person evaluation if the symptoms worsen or if the condition fails to improve as anticipated.  I provided 15 minutes of non-face-to-face time during this encounter.   [name] Pieper Kasik, CNM

## 2023-10-11 ENCOUNTER — Ambulatory Visit: Payer: Self-pay

## 2023-10-11 NOTE — Lactation Note (Signed)
This note was copied from a baby's chart. Lactation Consultation Note  Patient Name: Valerie Garner Today's Date: 10/11/2023 Age:26 wk.o.    Lactation met w/ 3 wk old Valerie Garner and mom.  MOB came in for an outpatient visit to check in and make sure infant was taking in enough at feeding.  Birth parent stated that infant is feeding at least 9-10x w/in a 24hr period.  Per MOB, she is experiencing a little bit of pain on the rt breast but not on the left.  She has notices let downs when she is feeding from the rt breast.   Infant started off feeding from the left breast in cradle hold.  MOB stated that it hurts in the beginning of the feed but when infant comes off and then she allows her to relatch herself the feedings are not painful.  Infant ate on the left breast for 16 minutes and took in 14ml.  Infant then was put on the rt breast, where she ate for 15 minutes and took in 24ml.    Infants total gain at both breast was 38ml .   The plan for dyad moving forward is; Continue to feed infant on demand at least 8-12x w/in a 24hr period.  Allow infant to eat on one breast until she releases.  If infant slows down on active feeding and is starting to pacify you, de-latch her. Attempt to burp infant after feeding and offer the other breast.  MOB verbalized understanding of the plan.  LC also encouraged mom to check back in if she was concerned about number of wet/dirty diapers or if there is a change in how often she is eating.    Pre-wt (left breast) w/ diaper - 3584g Post - wt (left breast) w/ diaper - 3616g Diaper wt - 18g Total (left breast) - 14ml  Pre- wt (rt breast) w/diaper - 7846N Post -wt (rt breast) w/diaper -6295M Total amt (both breast)= 24ml     Jerilyn S Free 10/11/2023, 4:08 PM

## 2023-10-17 ENCOUNTER — Ambulatory Visit: Payer: Medicaid Other | Admitting: Certified Nurse Midwife

## 2023-11-01 ENCOUNTER — Ambulatory Visit: Payer: Medicaid Other | Admitting: Licensed Practical Nurse

## 2023-11-01 DIAGNOSIS — Z30011 Encounter for initial prescription of contraceptive pills: Secondary | ICD-10-CM

## 2023-11-01 DIAGNOSIS — B49 Unspecified mycosis: Secondary | ICD-10-CM

## 2023-11-01 DIAGNOSIS — Z1332 Encounter for screening for maternal depression: Secondary | ICD-10-CM

## 2023-11-01 MED ORDER — NORETHINDRONE 0.35 MG PO TABS
1.0000 | ORAL_TABLET | Freq: Every day | ORAL | 11 refills | Status: AC
Start: 2023-11-01 — End: ?

## 2023-11-01 NOTE — Progress Notes (Signed)
Postpartum Visit  Chief Complaint:  Chief Complaint  Patient presents with   Postpartum Care    6 week postpartum     History of Present Illness: Patient is a 27 y.o. [id number] presents for postpartum visit. 09/21/23 SVB w/ LMD and RW SNM, intact,  -Bleeding: stopped -Appetite is  normal -Sleep as expected with a NB, 8hours in 24 hours -no concerns with voiding, stooling, or perineum -Mood overall good, has noticed that she can easily get irritated or angered, feels overwhelmed easily, this happens about once a day-not all day long. her partner has noticed this. She is working on getting more time to herself. She would like to go to therapy, this has helped in the past. Declines need for medication.  -Breastfeeding is going well, no concerns -has not had IC, Unsure about another pregnancy, desires POP's today  -SAHM -not exercising regularly     Newborn Details:  SINGLETON :  1. Austyn Wren 3550 gram  Maternal Details:  Breast Feeding:  yes, no concerns  Post partum depression/anxiety noted:  yes Edinburgh Post-Partum Depression Score:  4  Date of last PAP: 2023  normal   Past Medical History:  Diagnosis Date   Postpartum depression associated with first pregnancy     Past Surgical History:  Procedure Laterality Date   adenoidectomy     TONSILLECTOMY Bilateral    WISDOM TOOTH EXTRACTION      Prior to Admission medications   Medication Sig Start Date End Date Taking? Authorizing Provider  Prenatal Vit-Fe Fumarate-FA (PRENATAL PO) Take by mouth.   Yes [provider]    No Known Allergies   Social History   Socioeconomic History   Marital status: Married    Spouse name: Jeannett Senior   Number of children: 2   Years of education: 14   Highest education level: Associate degree: academic program  Occupational History   Occupation: [employer]  Tobacco Use   Smoking status: Never   Smokeless tobacco: Never  Vaping Use   Vaping status: Never Used   Substance and Sexual Activity   Alcohol use: Not Currently   Drug use: Never   Sexual activity: Yes    Partners: Male    Birth control/protection: Implant  Other Topics Concern   Not on file  Social History Narrative   Not on file   Social Determinants of Health   Financial Resource Strain: Low Risk  (02/03/2023)   Overall Financial Resource Strain (CARDIA)    Difficulty of Paying Living Expenses: Not very hard  Food Insecurity: No Food Insecurity (09/20/2023)   Hunger Vital Sign    Worried About Running Out of Food in the Last Year: Never true    Ran Out of Food in the Last Year: Never true  Transportation Needs: No Transportation Needs (09/20/2023)   PRAPARE - Administrator, Civil Service (Medical): No    Lack of Transportation (Non-Medical): No  Physical Activity: Insufficiently Active (02/03/2023)   Exercise Vital Sign    Days of Exercise per Week: 1 day    Minutes of Exercise per Session: 60 min  Stress: No Stress Concern Present (02/03/2023)   [employer] of Occupational Health - Occupational Stress Questionnaire    Feeling of Stress : Not at all  Social Connections: Moderately Integrated (02/03/2023)   Social Connection and Isolation Panel [NHANES]    Frequency of Communication with Friends and Family: Twice a week    Frequency of Social Gatherings with Friends and  Family: Three times a week    Attends Religious Services: More than 4 times per year    Active Member of Clubs or Organizations: No    Attends Banker Meetings: Never    Marital Status: Married  Catering manager Violence: Not At Risk (09/20/2023)   Humiliation, Afraid, Rape, and Kick questionnaire    Fear of Current or Ex-Partner: No    Emotionally Abused: No    Physically Abused: No    Sexually Abused: No    Family History  Problem Relation Age of Onset   Healthy Mother    Healthy Father    Leukemia Brother    Diabetes Paternal Grandfather    Breast cancer Neg Hx     Ovarian cancer Neg Hx    Colon cancer Neg Hx     ROS  see HPI  Physical Exam BP 130/80   Pulse 66   Wt 208 lb 8 oz (94.6 kg)   LMP 12/07/2022 (Exact Date)   Breastfeeding Yes   BMI 35.79 kg/m   Physical Exam Constitutional:      Appearance: Normal appearance.  Genitourinary:     Vulva normal.     Genitourinary Comments: Bimanual exam: uterus non gravid, non tender no masses. Adnexa non tender no masses not enlarged.    Cardiovascular:     Rate and Rhythm: Normal rate and regular rhythm.     Pulses: Normal pulses.     Heart sounds: Normal heart sounds.  Pulmonary:     Effort: Pulmonary effort is normal.     Breath sounds: Normal breath sounds.  Chest:     Comments: Breasts: lactating, no redness or masses. Nipples erect and intact bilaterally  Abdominal:     General: Abdomen is flat.     Tenderness: There is no abdominal tenderness.     Comments: Red dry area in a pattern over lower left fold of the abdomen, pt states she just noticed that today.   Umbilicus with same red dry pattern   Musculoskeletal:        General: Normal range of motion.     Cervical back: Normal range of motion and neck supple.  Neurological:     General: No focal deficit present.     Mental Status: She is alert.  Skin:    General: Skin is warm and dry.  Psychiatric:        Mood and Affect: Mood normal.      Assessment: 27 y.o. [id number] presenting for 6 week postpartum visit  Plan: Problem List Items Addressed This Visit   None Visit Diagnoses     Care and examination of lactating mother    -  Primary   Relevant Medications   norethindrone (ORTHO MICRONOR) 0.35 MG tablet   Encounter for screening for maternal depression       Fungal infection       Encounter for initial prescription of contraceptive pills       Relevant Medications   norethindrone (ORTHO MICRONOR) 0.35 MG tablet        1) Contraception Education given regarding options for contraception, including oral  contraceptives.  2)  Pap - ASCCP guidelines and rational discussed.  Patient opts for 3 screening interval due 2026  3) Patient underwent screening for postpartum depression with some concerns noted for PP rage-pt plans to follow  up with therapy.  4) Follow up 1 year for routine annual exam  5) fungal appearing infection, will treat with OTC antifungal and  see derm if no improvement   6) RTC in 1 year   Jannifer Hick   [facility]  11/01/23  4:36 PM

## 2023-12-01 ENCOUNTER — Encounter: Payer: Self-pay | Admitting: Licensed Practical Nurse

## 2023-12-07 ENCOUNTER — Encounter: Payer: Self-pay | Admitting: Licensed Practical Nurse

## 2024-01-09 ENCOUNTER — Ambulatory Visit (INDEPENDENT_AMBULATORY_CARE_PROVIDER_SITE_OTHER): Payer: Self-pay | Admitting: Family Medicine

## 2024-01-09 ENCOUNTER — Encounter: Payer: Self-pay | Admitting: Family Medicine

## 2024-01-09 VITALS — BP 105/70 | HR 75 | Ht 64.0 in | Wt 220.0 lb

## 2024-01-09 DIAGNOSIS — R1084 Generalized abdominal pain: Secondary | ICD-10-CM

## 2024-01-09 DIAGNOSIS — Z136 Encounter for screening for cardiovascular disorders: Secondary | ICD-10-CM

## 2024-01-09 NOTE — Progress Notes (Signed)
 BP 105/70   Pulse 75   Ht 5' 4 (1.626 m)   Wt 220 lb (99.8 kg)   SpO2 100%   Breastfeeding Yes   BMI 37.76 kg/m    Subjective:    Patient ID: Valerie Garner, female    DOB: February 20, 1996, 28 y.o.   MRN: [id number]  HPI: Valerie Garner is a 28 y.o. female  Chief Complaint  Patient presents with   Abdominal Pain    Patient says she has been having episodes of symptoms. Patient says January of this year is when she initially noticed them and then again last weekend. Patient says she not tried anything over the counter medications. Patient says she has family history of Gallbladder. Patient says she has noticed them randomly when it happens. Patient says the episodes last about 10-12 hours.    Nausea   Vomiting   Diarrhea   ABDOMINAL PAIN  Duration: once around Christmas, but then again in January, then again last weekend Onset: sudden Severity: severe Quality: like a contraction Location:  diffuse  Episode duration: 1-2 minutes and then go away and then come back Radiation: no Frequency: 3x in the last 2 months for a couple of hours to a day Alleviating factors:  Aggravating factors: Status: worse Treatments attempted: none Fever: no Nausea: yes Vomiting: yes Weight loss: no Decreased appetite: yes Diarrhea: yes Constipation: no Blood in stool: no Heartburn: no Jaundice: no Rash: no Dysuria/urinary frequency: no Hematuria: no History of sexually transmitted disease: no Recurrent NSAID use: no   Relevant past medical, surgical, family and social history reviewed and updated as indicated. Interim medical history since our last visit reviewed. Allergies and medications reviewed and updated.  Review of Systems  Constitutional: Negative.   Respiratory: Negative.    Cardiovascular: Negative.   Gastrointestinal:  Positive for abdominal distention, abdominal pain, diarrhea, nausea and vomiting. Negative for anal bleeding, blood in stool,  constipation and rectal pain.  Musculoskeletal: Negative.   Skin: Negative.   Psychiatric/Behavioral: Negative.      Per HPI unless specifically indicated above     Objective:    BP 105/70   Pulse 75   Ht 5' 4 (1.626 m)   Wt 220 lb (99.8 kg)   SpO2 100%   Breastfeeding Yes   BMI 37.76 kg/m   Wt Readings from Last 3 Encounters:  01/09/24 220 lb (99.8 kg)  11/01/23 208 lb 8 oz (94.6 kg)  09/20/23 233 lb (105.7 kg)    Physical Exam Vitals and nursing note reviewed.  Constitutional:      General: She is not in acute distress.    Appearance: Normal appearance. She is not ill-appearing, toxic-appearing or diaphoretic.  HENT:     Head: Normocephalic and atraumatic.     Right Ear: External ear normal.     Left Ear: External ear normal.     Nose: Nose normal.     Mouth/Throat:     Mouth: Mucous membranes are moist.     Pharynx: Oropharynx is clear.  Eyes:     General: No scleral icterus.       Right eye: No discharge.        Left eye: No discharge.     Extraocular Movements: Extraocular movements intact.     Conjunctiva/sclera: Conjunctivae normal.     Pupils: Pupils are equal, round, and reactive to light.  Cardiovascular:     Rate and Rhythm: Normal rate and regular rhythm.  Pulses: Normal pulses.     Heart sounds: Normal heart sounds. No murmur heard.    No friction rub. No gallop.  Pulmonary:     Effort: Pulmonary effort is normal. No respiratory distress.     Breath sounds: Normal breath sounds. No stridor. No wheezing, rhonchi or rales.  Chest:     Chest wall: No tenderness.  Abdominal:     General: Bowel sounds are normal.     Palpations: Abdomen is soft.     Tenderness: There is no abdominal tenderness.  Musculoskeletal:        General: Normal range of motion.     Cervical back: Normal range of motion and neck supple.  Skin:    General: Skin is warm and dry.     Capillary Refill: Capillary refill takes less than 2 seconds.     Coloration: Skin is not  jaundiced or pale.     Findings: No bruising, erythema, lesion or rash.  Neurological:     General: No focal deficit present.     Mental Status: She is alert and oriented to person, place, and time. Mental status is at baseline.  Psychiatric:        Mood and Affect: Mood normal.        Behavior: Behavior normal.        Thought Content: Thought content normal.        Judgment: Judgment normal.     Results for orders placed or performed during the hospital encounter of 09/20/23  Type and screen   Collection Time: 09/20/23  9:49 AM  Result Value Ref Range   ABO/RH(D) A POS    Antibody Screen NEG    Sample Expiration      09/23/2023,2359 Performed at [facility], [address] [place], [place] [address]   CBC   Collection Time: 09/20/23  9:50 AM  Result Value Ref Range   WBC 12.9 (H) 4.0 - 10.5 K/uL   RBC 3.98 3.87 - 5.11 MIL/uL   Hemoglobin 12.1 12.0 - 15.0 g/dL   HCT 64.9 (L) 63.9 - 53.9 %   MCV 87.9 80.0 - 100.0 fL   MCH 30.4 26.0 - 34.0 pg   MCHC 34.6 30.0 - 36.0 g/dL   RDW 87.2 88.4 - 84.4 %   Platelets 245 150 - 400 K/uL   nRBC 0.0 0.0 - 0.2 %  RPR   Collection Time: 09/20/23  9:50 AM  Result Value Ref Range   RPR Ser Ql NON REACTIVE NON REACTIVE  CBC   Collection Time: 09/21/23  6:16 AM  Result Value Ref Range   WBC 12.7 (H) 4.0 - 10.5 K/uL   RBC 3.44 (L) 3.87 - 5.11 MIL/uL   Hemoglobin 10.3 (L) 12.0 - 15.0 g/dL   HCT 69.3 (L) 63.9 - 53.9 %   MCV 89.0 80.0 - 100.0 fL   MCH 29.9 26.0 - 34.0 pg   MCHC 33.7 30.0 - 36.0 g/dL   RDW 87.1 88.4 - 84.4 %   Platelets 207 150 - 400 K/uL   nRBC 0.0 0.0 - 0.2 %      Assessment & Plan:   Problem List Items Addressed This Visit       Other   Obesity, morbid (HCC)   Labs drawn today. Await results. Will discuss further when not breast feeding.       Relevant Orders   TSH   Other Visit Diagnoses       Generalized abdominal pain    -  Primary   Will check labs and obtain RUQ US . Await results.  Treat as needed.   Relevant Orders   US  Abdomen Limited RUQ (LIVER/GB)   CBC with Differential/Platelet   Comprehensive metabolic panel   Lipase   Amylase     Screening for cardiovascular condition       Labs drawn today. Await results.   Relevant Orders   Lipid Panel w/o Chol/HDL Ratio        Follow up plan: Return in about 4 weeks (around 02/06/2024) for physical.

## 2024-01-09 NOTE — Assessment & Plan Note (Signed)
Labs drawn today. Await results. Will discuss further when not breast feeding.

## 2024-01-10 ENCOUNTER — Encounter: Payer: Self-pay | Admitting: Family Medicine

## 2024-01-10 LAB — CBC WITH DIFFERENTIAL/PLATELET
Basophils Absolute: 0.1 10*3/uL (ref 0.0–0.2)
Basos: 1 %
EOS (ABSOLUTE): 0.3 10*3/uL (ref 0.0–0.4)
Eos: 3 %
Hematocrit: 39.8 % (ref 34.0–46.6)
Hemoglobin: 13.2 g/dL (ref 11.1–15.9)
Immature Grans (Abs): 0 10*3/uL (ref 0.0–0.1)
Immature Granulocytes: 0 %
Lymphocytes Absolute: 2.4 10*3/uL (ref 0.7–3.1)
Lymphs: 32 %
MCH: 30 pg (ref 26.6–33.0)
MCHC: 33.2 g/dL (ref 31.5–35.7)
MCV: 91 fL (ref 79–97)
Monocytes Absolute: 0.6 10*3/uL (ref 0.1–0.9)
Monocytes: 8 %
Neutrophils Absolute: 4.2 10*3/uL (ref 1.4–7.0)
Neutrophils: 56 %
Platelets: 359 10*3/uL (ref 150–450)
RBC: 4.4 x10E6/uL (ref 3.77–5.28)
RDW: 13.1 % (ref 11.7–15.4)
WBC: 7.4 10*3/uL (ref 3.4–10.8)

## 2024-01-10 LAB — COMPREHENSIVE METABOLIC PANEL
ALT: 14 [IU]/L (ref 0–32)
AST: 16 [IU]/L (ref 0–40)
Albumin: 4.8 g/dL (ref 4.0–5.0)
Alkaline Phosphatase: 99 [IU]/L (ref 44–121)
BUN/Creatinine Ratio: 29 — ABNORMAL HIGH (ref 9–23)
BUN: 20 mg/dL (ref 6–20)
Bilirubin Total: 0.2 mg/dL (ref 0.0–1.2)
CO2: 22 mmol/L (ref 20–29)
Calcium: 9.5 mg/dL (ref 8.7–10.2)
Chloride: 102 mmol/L (ref 96–106)
Creatinine, Ser: 0.69 mg/dL (ref 0.57–1.00)
Globulin, Total: 2.3 g/dL (ref 1.5–4.5)
Glucose: 85 mg/dL (ref 70–99)
Potassium: 4.6 mmol/L (ref 3.5–5.2)
Sodium: 140 mmol/L (ref 134–144)
Total Protein: 7.1 g/dL (ref 6.0–8.5)
eGFR: 123 mL/min/{1.73_m2} (ref >59)

## 2024-01-10 LAB — LIPID PANEL W/O CHOL/HDL RATIO
Cholesterol, Total: 200 mg/dL — ABNORMAL HIGH (ref 100–199)
HDL: 60 mg/dL (ref >39)
LDL Chol Calc (NIH): 101 mg/dL — ABNORMAL HIGH (ref 0–99)
Triglycerides: 231 mg/dL — ABNORMAL HIGH (ref 0–149)
VLDL Cholesterol Cal: 39 mg/dL (ref 5–40)

## 2024-01-10 LAB — LIPASE: Lipase: 43 U/L (ref 14–72)

## 2024-01-10 LAB — AMYLASE: Amylase: 113 U/L — ABNORMAL HIGH (ref 31–110)

## 2024-01-10 LAB — TSH: TSH: 1.26 u[IU]/mL (ref 0.450–4.500)

## 2024-01-23 ENCOUNTER — Ambulatory Visit
Admission: RE | Admit: 2024-01-23 | Discharge: 2024-01-23 | Disposition: A | Discharge status: Home / Self Care | Payer: Medicaid Other | Source: Ambulatory Visit | Attending: Family Medicine | Admitting: Family Medicine

## 2024-01-23 DIAGNOSIS — R1084 Generalized abdominal pain: Secondary | ICD-10-CM | POA: Insufficient documentation

## 2024-01-28 ENCOUNTER — Other Ambulatory Visit: Payer: Self-pay | Admitting: Family Medicine

## 2024-01-28 ENCOUNTER — Encounter: Payer: Self-pay | Admitting: Family Medicine

## 2024-01-28 DIAGNOSIS — K8020 Calculus of gallbladder without cholecystitis without obstruction: Secondary | ICD-10-CM

## 2024-02-01 ENCOUNTER — Encounter: Payer: Self-pay | Admitting: General Surgery

## 2024-02-01 ENCOUNTER — Ambulatory Visit (INDEPENDENT_AMBULATORY_CARE_PROVIDER_SITE_OTHER): Payer: Self-pay | Admitting: General Surgery

## 2024-02-01 ENCOUNTER — Ambulatory Visit: Payer: Self-pay | Admitting: General Surgery

## 2024-02-01 VITALS — BP 109/71 | HR 76 | Temp 98.2°F | Ht 64.0 in | Wt 223.0 lb

## 2024-02-01 DIAGNOSIS — K8050 Calculus of bile duct without cholangitis or cholecystitis without obstruction: Secondary | ICD-10-CM

## 2024-02-01 NOTE — Progress Notes (Signed)
 Patient ID: Valerie Garner, female   DOB: Mar 12, 1996, 28 y.o.   MRN: [id number] CC: Biliary Colic History of Present Illness Azhar Yogi Jeoffrey Massed is a 28 y.o. female with no significant past medical history who presents in consultation for biliary colic.  The patient reports that in December she developed epigastric and right upper quadrant pain associated with nausea and vomiting.  She says she has had several of those attacks that wake her up at night.  She says that when she does not have a full on attack she does feel like she gets some discomfort after she eats.  She denies any fevers or chills.  She has had episodes of emesis with this.  She also reports some diarrhea.  She presented to her primary care doctor with this and labs were obtained that were within normal limits and she had a right upper quadrant ultrasound that was consistent with cholelithiasis..  Past Medical History Past Medical History:  Diagnosis Date   Postpartum depression associated with first pregnancy        Past Surgical History:  Procedure Laterality Date   adenoidectomy     TONSILLECTOMY Bilateral    WISDOM TOOTH EXTRACTION      No Known Allergies  Current Outpatient Medications  Medication Sig Dispense Refill   norethindrone (ORTHO MICRONOR) 0.35 MG tablet Take 1 tablet (0.35 mg total) by mouth daily. 28 tablet 11   Prenatal Vit-Fe Fumarate-FA (PRENATAL PO) Take by mouth.     No current facility-administered medications for this visit.    Family History Family History  Problem Relation Age of Onset   Healthy Mother    Healthy Father    Leukemia Brother    Diabetes Paternal Grandfather    Breast cancer Neg Hx    Ovarian cancer Neg Hx    Colon cancer Neg Hx        Social History Social History   Tobacco Use   Smoking status: Never    Passive exposure: Never   Smokeless tobacco: Never  Vaping Use   Vaping status: Never Used  Substance Use Topics   Alcohol use: Not  Currently   Drug use: Never        ROS Full ROS of systems performed and is otherwise negative there than what is stated in the HPI  Physical Exam Blood pressure 109/71, pulse 76, temperature 98.2 F (36.8 C), height 5\' 4"  (1.626 m), weight 223 lb (101.2 kg), SpO2 98%, currently breastfeeding.  Alert and oriented x 3, no acute distress, moving all extremity spontaneously, good auscultation bilaterally, regular rate and rhythm, abdomen is soft, obese nontender and nondistended without any surgical scars .  PERRLA Data Reviewed Labs reviewed and total bilirubin is normal and no increase in her alkaline phosphatase.  I reviewed independently her ultrasound that showed gallstones within the gallbladder wall without any gallbladder wall thickening or pericholecystic fluid  I have personally reviewed the patient's imaging and medical records.    Assessment    28 year old with biliary colic  Plan    I discussed risk and benefits alternatives of the procedure of robotic assisted cholecystectomy.  Discussed risk of infection, bleeding, bowel injury, injury to the common bile duct, bile leak and retained stone.  She understands these risks and wishes to proceed.  We will use ICG    Kandis Cocking 02/01/2024, 3:18 PM

## 2024-02-01 NOTE — Patient Instructions (Addendum)
 You have requested to have your gallbladder removed. This will be done at [facility] with Dr. Maurine Minister. We are looking at doing this on Monday March 10th.  You will most likely be out of work 1-2 weeks for this surgery.  If you have FMLA or disability paperwork that needs filled out you may drop this off at our office or this can be faxed to [phone].  You will return after your post-op appointment with a lifting restriction for approximately 4 more weeks.  You will be able to eat anything you would like to following surgery. But, start by eating a bland diet and advance this as tolerated. The Gallbladder diet is below, please go as closely by this diet as possible prior to surgery to avoid any further attacks.  Please see the (blue)pre-care form that you have been given today. Our surgery scheduler will call you to verify surgery date and to go over information.   If you have any questions, please call our office.  Laparoscopic Cholecystectomy Laparoscopic cholecystectomy is surgery to remove the gallbladder. The gallbladder is located in the upper right part of the abdomen, behind the liver. It is a storage sac for bile, which is produced in the liver. Bile aids in the digestion and absorption of fats. Cholecystectomy is often done for inflammation of the gallbladder (cholecystitis). This condition is usually caused by a buildup of gallstones (cholelithiasis) in the gallbladder. Gallstones can block the flow of bile, and that can result in inflammation and pain. In severe cases, emergency surgery may be required. If emergency surgery is not required, you will have time to prepare for the procedure. Laparoscopic surgery is an alternative to open surgery. Laparoscopic surgery has a shorter recovery time. Your common bile duct may also need to be examined during the procedure. If stones are found in the common bile duct, they may be removed. LET [facility] CARE PROVIDER KNOW ABOUT: Any  allergies you have. All medicines you are taking, including vitamins, herbs, eye drops, creams, and over-the-counter medicines. Previous problems you or members of your family have had with the use of anesthetics. Any blood disorders you have. Previous surgeries you have had.  Any medical conditions you have. RISKS AND COMPLICATIONS Generally, this is a safe procedure. However, problems may occur, including: Infection. Bleeding. Allergic reactions to medicines. Damage to other structures or organs. A stone remaining in the common bile duct. A bile leak from the cyst duct that is clipped when your gallbladder is removed. The need to convert to open surgery, which requires a larger incision in the abdomen. This may be necessary if your surgeon thinks that it is not safe to continue with a laparoscopic procedure. BEFORE THE PROCEDURE Ask your health care provider about: Changing or stopping your regular medicines. This is especially important if you are taking diabetes medicines or blood thinners. Taking medicines such as aspirin and ibuprofen. These medicines can thin your blood. Do not take these medicines before your procedure if your health care provider instructs you not to. Follow instructions from your health care provider about eating or drinking restrictions. Let your health care provider know if you develop a cold or an infection before surgery. Plan to have someone take you home after the procedure. Ask your health care provider how your surgical site will be marked or identified. You may be given antibiotic medicine to help prevent infection. PROCEDURE To reduce your risk of infection: Your health care team will wash or sanitize their  hands. Your skin will be washed with soap. An IV tube may be inserted into one of your veins. You will be given a medicine to make you fall asleep (general anesthetic). A breathing tube will be placed in your mouth. The surgeon will make several  small cuts (incisions) in your abdomen. A thin, lighted tube (laparoscope) that has a tiny camera on the end will be inserted through one of the small incisions. The camera on the laparoscope will send a picture to a TV screen (monitor) in the operating room. This will give the surgeon a good view inside your abdomen. A gas will be pumped into your abdomen. This will expand your abdomen to give the surgeon more room to perform the surgery. Other tools that are needed for the procedure will be inserted through the other incisions. The gallbladder will be removed through one of the incisions. After your gallbladder has been removed, the incisions will be closed with stitches (sutures), staples, or skin glue. Your incisions may be covered with a bandage (dressing). The procedure may vary among health care providers and hospitals. AFTER THE PROCEDURE Your blood pressure, heart rate, breathing rate, and blood oxygen level will be monitored often until the medicines you were given have worn off. You will be given medicines as needed to control your pain.   This information is not intended to replace advice given to you by your health care provider. Make sure you discuss any questions you have with your health care provider.   Document Released: 11/21/2005 Document Revised: 08/12/2015 Document Reviewed: 07/03/2013 Elsevier Interactive Patient Education 2016 Elsevier Inc.   Low-Fat Diet for Gallbladder Conditions A low-fat diet can be helpful if you have pancreatitis or a gallbladder condition. With these conditions, your pancreas and gallbladder have trouble digesting fats. A healthy eating plan with less fat will help rest your pancreas and gallbladder and reduce your symptoms. WHAT DO I NEED TO KNOW ABOUT THIS DIET? Eat a low-fat diet. Reduce your fat intake to less than 20-30% of your total daily calories. This is less than 50-60 g of fat per day. Remember that you need some fat in your diet. Ask  your dietician what your daily goal should be. Choose nonfat and low-fat healthy foods. Look for the words "nonfat," "low fat," or "fat free." As a guide, look on the label and choose foods with less than 3 g of fat per serving. Eat only one serving. Avoid alcohol. Do not smoke. If you need help quitting, talk with your health care provider. Eat small frequent meals instead of three large heavy meals. WHAT FOODS CAN I EAT? Grains Include healthy grains and starches such as potatoes, wheat bread, fiber-rich cereal, and brown rice. Choose whole grain options whenever possible. In adults, whole grains should account for 45-65% of your daily calories.  Fruits and Vegetables Eat plenty of fruits and vegetables. Fresh fruits and vegetables add fiber to your diet. Meats and Other Protein Sources Eat lean meat such as chicken and pork. Trim any fat off of meat before cooking it. Eggs, fish, and beans are other sources of protein. In adults, these foods should account for 10-35% of your daily calories. Dairy Choose low-fat milk and dairy options. Dairy includes fat and protein, as well as calcium.  Fats and Oils Limit high-fat foods such as fried foods, sweets, baked goods, sugary drinks.  Other Creamy sauces and condiments, such as mayonnaise, can add extra fat. Think about whether or not you need to  use them, or use smaller amounts or low fat options. WHAT FOODS ARE NOT RECOMMENDED? High fat foods, such as: [employer]. Ice cream. [place] toast. Sweet rolls. Pizza. Cheese bread. Foods covered with batter, butter, creamy sauces, or cheese. Fried foods. Sugary drinks and desserts. Foods that cause gas or bloating   This information is not intended to replace advice given to you by your health care provider. Make sure you discuss any questions you have with your health care provider.   Document Released: 11/26/2013 Document Reviewed: 11/26/2013 Elsevier Interactive Patient Education [employer].

## 2024-02-02 ENCOUNTER — Telehealth: Payer: Self-pay | Admitting: General Surgery

## 2024-02-02 NOTE — Telephone Encounter (Signed)
 Patient has been advised of Pre-Admission date/time, and Surgery date at [facility].  Surgery Date: 02/12/24 Preadmission Testing Date: 02/06/24 (phone 8a-1p)  Patient has been made aware to call [phone], between 1-3:00pm the day before surgery, to find out what time to arrive for surgery.

## 2024-02-06 ENCOUNTER — Other Ambulatory Visit: Payer: Self-pay

## 2024-02-06 ENCOUNTER — Encounter
Admission: RE | Admit: 2024-02-06 | Discharge: 2024-02-06 | Disposition: A | Discharge status: Home / Self Care | Payer: Medicaid Other | Source: Ambulatory Visit | Attending: General Surgery | Admitting: General Surgery

## 2024-02-06 VITALS — Ht 64.0 in | Wt 223.0 lb

## 2024-02-06 DIAGNOSIS — Z01812 Encounter for preprocedural laboratory examination: Secondary | ICD-10-CM

## 2024-02-06 HISTORY — DX: Calculus of bile duct without cholangitis or cholecystitis without obstruction: K80.50

## 2024-02-06 HISTORY — DX: Obesity, unspecified: E66.9

## 2024-02-06 NOTE — Patient Instructions (Addendum)
 Your procedure is scheduled on: Monday, March 10 Report to the Registration Desk on the 1st floor of the [facility] To find out your arrival time, please call [phone] between 1PM - 3PM on: Friday, March 7 If your arrival time is 6:00 am, do not arrive before that time as the [facility] entrance doors do not open until 6:00 am.  REMEMBER: Instructions that are not followed completely may result in serious medical risk, up to and including death; or upon the discretion of your surgeon and anesthesiologist your surgery may need to be rescheduled.  Do not eat or drink after midnight the night before surgery.  No gum chewing or hard candies.  One week prior to surgery:  Stop Anti-inflammatories (NSAIDS) such as Advil, Aleve, Ibuprofen, Motrin, Naproxen, Naprosyn and Aspirin based products such as Excedrin, Goody's Powder, BC Powder. Stop ANY OVER THE COUNTER supplements until after surgery.  You may however, continue to take Tylenol if needed for pain up until the day of surgery.  Continue taking all of your other prescription medications up until the day of surgery.  ON THE DAY OF SURGERY DO NOT TAKE ANY MEDICATIONS   No Alcohol for 24 hours before or after surgery.  No Smoking including e-cigarettes for 24 hours before surgery.  No chewable tobacco products for at least 6 hours before surgery.  No nicotine patches on the day of surgery.  Do not use any "recreational" drugs for at least a week (preferably 2 weeks) before your surgery.  Please be advised that the combination of cocaine and anesthesia may have negative outcomes, up to and including death. If you test positive for cocaine, your surgery will be cancelled.  On the morning of surgery brush your teeth with toothpaste and water, you may rinse your mouth with mouthwash if you wish. Do not swallow any toothpaste or mouthwash.  Use CHG wipes as directed on instruction sheet.  Do not wear jewelry, make-up, hairpins,  clips or nail polish.  For welded (permanent) jewelry: bracelets, anklets, waist bands, etc.  Please have this removed prior to surgery.  If it is not removed, there is a chance that hospital personnel will need to cut it off on the day of surgery.  Do not wear lotions, powders, or perfumes.   Do not shave body hair from the neck down 48 hours before surgery.  Contact lenses, hearing aids and dentures may not be worn into surgery.  Do not bring valuables to the hospital. [facility] is not responsible for any missing/lost belongings or valuables.   Notify your doctor if there is any change in your medical condition (cold, fever, infection).  Wear comfortable clothing (specific to your surgery type) to the hospital.  After surgery, you can help prevent lung complications by doing breathing exercises.  Take deep breaths and cough every 1-2 hours. Your doctor may order a device called an Incentive Spirometer to help you take deep breaths. When coughing or sneezing, hold a pillow firmly against your incision with both hands. This is called "splinting." Doing this helps protect your incision. It also decreases belly discomfort.  If you are being discharged the day of surgery, you will not be allowed to drive home. You will need a responsible individual to drive you home and stay with you for 24 hours after surgery.   If you are taking public transportation, you will need to have a responsible individual with you.  Please call the Pre-admissions Testing Dept. at [phone]  if you have any questions about these instructions.  Surgery Visitation Policy:  Patients having surgery or a procedure may have two visitors.  Children under the age of 96 must have an adult with them who is not the patient.  Temporary Visitor Restrictions Due to increasing cases of flu, RSV and COVID-19: Children ages 67 and under will not be able to visit patients in [facility] hospitals under most  circumstances.   Preparing the Skin Before Surgery     To help prevent the risk of infection at your surgical site, we are now providing you with rinse-free Sage 2% Chlorhexidine Gluconate (CHG) disposable wipes.  Chlorhexidine Gluconate (CHG) Soap  o An antiseptic cleaner that kills germs and bonds with the skin to continue killing germs even after washing  o Used for showering the night before surgery and morning of surgery  The night before surgery: Shower or bathe with warm water. Do not apply perfume, lotions, powders. Wait one hour after shower. Skin should be dry and cool. Open Sage wipe package - use 6 disposable cloths. Wipe body using one cloth for the right arm, one cloth for the left arm, one cloth for the right leg, one cloth for the left leg, one cloth for the chest/abdomen area, and one cloth for the back. Do not use on open wounds or sores. Do not use on face or genitals (private parts). If you are breast feeding, do not use on breasts. 5. Do not rinse, allow to dry. 6. Skin may feel "tacky" for several minutes. 7. Dress in clean clothes. 8. Place clean sheets on your bed and do not sleep with pets.  REPEAT ABOVE ON THE MORNING OF SURGERY BEFORE ARRIVING TO THE HOSPITAL.

## 2024-02-08 ENCOUNTER — Encounter: Payer: Self-pay | Admitting: Family Medicine

## 2024-02-08 ENCOUNTER — Ambulatory Visit: Payer: Medicaid Other | Admitting: Family Medicine

## 2024-02-08 VITALS — BP 100/68 | HR 78 | Temp 98.1°F | Resp 16 | Ht 64.02 in | Wt 229.0 lb

## 2024-02-08 DIAGNOSIS — F418 Other specified anxiety disorders: Secondary | ICD-10-CM | POA: Diagnosis not present

## 2024-02-08 DIAGNOSIS — Z0000 Encounter for general adult medical examination without abnormal findings: Secondary | ICD-10-CM | POA: Diagnosis not present

## 2024-02-08 DIAGNOSIS — O99345 Other mental disorders complicating the puerperium: Secondary | ICD-10-CM | POA: Insufficient documentation

## 2024-02-08 MED ORDER — SERTRALINE HCL 25 MG PO TABS: ORAL_TABLET | ORAL | 3 refills | Status: DC

## 2024-02-08 NOTE — Assessment & Plan Note (Signed)
 Will start her on low dose sertraline. Will start after CCY. Recheck 6 weeks. Call with any concerns.

## 2024-02-08 NOTE — Progress Notes (Signed)
 BP 100/68 (BP Location: Left Arm, Patient Position: Sitting, Cuff Size: Large)   Pulse 78   Temp 98.1 F (36.7 C) (Oral)   Resp 16   Ht 5' 4.02" (1.626 m)   Wt 229 lb (103.9 kg)   LMP  (LMP Unknown)   SpO2 99%   Breastfeeding Yes   BMI 39.29 kg/m    Subjective:    Patient ID: Valerie Garner, female    DOB: 1996/03/05, 28 y.o.   MRN: [id number]  HPI: Valerie Garner is a 28 y.o. female presenting on 02/08/2024 for comprehensive medical examination. Current medical complaints include:  ANXIETY/ANGER Duration: since delivery Status:exacerbated Anxious mood: yes  Excessive worrying: yes Irritability: yes  Sweating: no Nausea: no Palpitations:no Hyperventilation: no Panic attacks: no Agoraphobia: no  Obscessions/compulsions: no Depressed mood: no    02/08/2024    2:47 PM 01/09/2024    2:00 PM 02/28/2023    3:33 PM 05/25/2022   11:07 AM 10/08/2021   11:34 AM  Depression screen PHQ 2/9  Decreased Interest 0 0 0 0 0  Down, Depressed, Hopeless 0 0 0 0 0  PHQ - 2 Score 0 0 0 0 0  Altered sleeping 0 0 1  0  Tired, decreased energy 0 0 2  1  Change in appetite 0 0 0  0  Feeling bad or failure about yourself  0 0 0  0  Trouble concentrating 0 0 0  0  Moving slowly or fidgety/restless 0 0 0  0  Suicidal thoughts 0 0 0  0  PHQ-9 Score 0 0 3  1  Difficult doing work/chores  Not difficult at all Not difficult at all     Anhedonia: no Weight changes: no Insomnia: no   Hypersomnia: no Fatigue/loss of energy: no Feelings of worthlessness: no Feelings of guilt: no Impaired concentration/indecisiveness: no Suicidal ideations: no  Crying spells: no Recent Stressors/Life Changes: no   Relationship problems: no   Family stress: no     Financial stress: no    Job stress: no    Recent death/loss: no  Menopausal Symptoms: no  Depression Screen done today and results listed below:     02/08/2024    2:47 PM 01/09/2024    2:00 PM 02/28/2023    3:33 PM  05/25/2022   11:07 AM 10/08/2021   11:34 AM  Depression screen PHQ 2/9  Decreased Interest 0 0 0 0 0  Down, Depressed, Hopeless 0 0 0 0 0  PHQ - 2 Score 0 0 0 0 0  Altered sleeping 0 0 1  0  Tired, decreased energy 0 0 2  1  Change in appetite 0 0 0  0  Feeling bad or failure about yourself  0 0 0  0  Trouble concentrating 0 0 0  0  Moving slowly or fidgety/restless 0 0 0  0  Suicidal thoughts 0 0 0  0  PHQ-9 Score 0 0 3  1  Difficult doing work/chores  Not difficult at all Not difficult at all      Past Medical History:  Past Medical History:  Diagnosis Date   Biliary colic    Obesity (BMI 35.0-39.9 without comorbidity)    Postpartum depression associated with first pregnancy     Surgical History:  Past Surgical History:  Procedure Laterality Date   TONSILLECTOMY AND ADENOIDECTOMY Bilateral    WISDOM TOOTH EXTRACTION      Medications:  Current Outpatient Medications on File  Prior to Visit  Medication Sig   norethindrone (ORTHO MICRONOR) 0.35 MG tablet Take 1 tablet (0.35 mg total) by mouth daily.   Prenatal Vit-Fe Fumarate-FA (PRENATAL PO) Take 1 tablet by mouth daily. (Patient not taking: Reported on 02/08/2024)   No current facility-administered medications on file prior to visit.    Allergies:  No Known Allergies  Social History:  Social History   Socioeconomic History   Marital status: Married    Spouse name: Jeannett Senior   Number of children: 3   Years of education: 14   Highest education level: Associate degree: academic program  Occupational History   Occupation: [employer]  Tobacco Use   Smoking status: Never    Passive exposure: Never   Smokeless tobacco: Never  Vaping Use   Vaping status: Never Used  Substance and Sexual Activity   Alcohol use: Not Currently   Drug use: Never   Sexual activity: Yes    Partners: Male    Birth control/protection: Pill  Other Topics Concern   Not on file  Social History Narrative   Not on file   Social  Drivers of Health   Financial Resource Strain: Low Risk  (02/05/2024)   Overall Financial Resource Strain (CARDIA)    Difficulty of Paying Living Expenses: Not very hard  Food Insecurity: No Food Insecurity (02/05/2024)   Hunger Vital Sign    Worried About Running Out of Food in the Last Year: Never true    Ran Out of Food in the Last Year: Never true  Transportation Needs: No Transportation Needs (02/05/2024)   PRAPARE - Administrator, Civil Service (Medical): No    Lack of Transportation (Non-Medical): No  Physical Activity: Unknown (02/05/2024)   Exercise Vital Sign    Days of Exercise per Week: 0 days    Minutes of Exercise per Session: Not on file  Stress: No Stress Concern Present (02/05/2024)   [employer] of Occupational Health - Occupational Stress Questionnaire    Feeling of Stress : Only a little  Social Connections: Socially Integrated (02/05/2024)   Social Connection and Isolation Panel [NHANES]    Frequency of Communication with Friends and Family: Three times a week    Frequency of Social Gatherings with Friends and Family: Once a week    Attends Religious Services: More than 4 times per year    Active Member of [employer] or Organizations: Yes    Attends Engineer, structural: More than 4 times per year    Marital Status: Married  Catering manager Violence: Not At Risk (09/20/2023)   Humiliation, Afraid, Rape, and Kick questionnaire    Fear of Current or Ex-Partner: No    Emotionally Abused: No    Physically Abused: No    Sexually Abused: No   Social History   Tobacco Use  Smoking Status Never   Passive exposure: Never  Smokeless Tobacco Never   Social History   Substance and Sexual Activity  Alcohol Use Not Currently    Family History:  Family History  Problem Relation Age of Onset   Healthy Mother    Healthy Father    Leukemia Brother    Diabetes Paternal Grandfather    Breast cancer Neg Hx    Ovarian cancer Neg Hx    Colon cancer  Neg Hx     Past medical history, surgical history, medications, allergies, family history and social history reviewed with patient today and changes made to appropriate areas of the chart.  Review of Systems  Constitutional: Negative.   HENT: Negative.    Eyes: Negative.   Respiratory: Negative.    Cardiovascular: Negative.   Gastrointestinal: Negative.  Negative for abdominal pain, blood in stool, constipation, diarrhea, heartburn, melena, nausea and vomiting.  Genitourinary: Negative.   Musculoskeletal: Negative.   Skin: Negative.   Neurological: Negative.   Endo/Heme/Allergies: Negative.   Psychiatric/Behavioral:  Negative for depression, hallucinations, memory loss, substance abuse and suicidal ideas. The patient is nervous/anxious. The patient does not have insomnia.    All other ROS negative except what is listed above and in the HPI.      Objective:    BP 100/68 (BP Location: Left Arm, Patient Position: Sitting, Cuff Size: Large)   Pulse 78   Temp 98.1 F (36.7 C) (Oral)   Resp 16   Ht 5' 4.02" (1.626 m)   Wt 229 lb (103.9 kg)   LMP  (LMP Unknown)   SpO2 99%   Breastfeeding Yes   BMI 39.29 kg/m   Wt Readings from Last 3 Encounters:  02/08/24 229 lb (103.9 kg)  02/06/24 223 lb (101.2 kg)  02/01/24 223 lb (101.2 kg)    Physical Exam Vitals and nursing note reviewed.  Constitutional:      General: She is not in acute distress.    Appearance: Normal appearance. She is obese. She is not ill-appearing, toxic-appearing or diaphoretic.  HENT:     Head: Normocephalic and atraumatic.     Right Ear: Tympanic membrane, ear canal and external ear normal. There is no impacted cerumen.     Left Ear: Tympanic membrane, ear canal and external ear normal. There is no impacted cerumen.     Nose: Nose normal. No congestion or rhinorrhea.     Mouth/Throat:     Mouth: Mucous membranes are moist.     Pharynx: Oropharynx is clear. No oropharyngeal exudate or posterior  oropharyngeal erythema.  Eyes:     General: No scleral icterus.       Right eye: No discharge.        Left eye: No discharge.     Extraocular Movements: Extraocular movements intact.     Conjunctiva/sclera: Conjunctivae normal.     Pupils: Pupils are equal, round, and reactive to light.  Neck:     Vascular: No carotid bruit.  Cardiovascular:     Rate and Rhythm: Normal rate and regular rhythm.     Pulses: Normal pulses.     Heart sounds: No murmur heard.    No friction rub. No gallop.  Pulmonary:     Effort: Pulmonary effort is normal. No respiratory distress.     Breath sounds: Normal breath sounds. No stridor. No wheezing, rhonchi or rales.  Chest:     Chest wall: No tenderness.  Abdominal:     General: Abdomen is flat. Bowel sounds are normal. There is no distension.     Palpations: Abdomen is soft. There is no mass.     Tenderness: There is no abdominal tenderness. There is no right CVA tenderness, left CVA tenderness, guarding or rebound.     Hernia: No hernia is present.  Genitourinary:    Comments: Breast and pelvic exams deferred with shared decision making Musculoskeletal:        General: No swelling, tenderness, deformity or signs of injury.     Cervical back: Normal range of motion and neck supple. No rigidity. No muscular tenderness.     Right lower leg: No edema.  Left lower leg: No edema.  Lymphadenopathy:     Cervical: No cervical adenopathy.  Skin:    General: Skin is warm and dry.     Capillary Refill: Capillary refill takes less than 2 seconds.     Coloration: Skin is not jaundiced or pale.     Findings: No bruising, erythema, lesion or rash.  Neurological:     General: No focal deficit present.     Mental Status: She is alert and oriented to person, place, and time. Mental status is at baseline.     Cranial Nerves: No cranial nerve deficit.     Sensory: No sensory deficit.     Motor: No weakness.     Coordination: Coordination normal.     Gait: Gait  normal.     Deep Tendon Reflexes: Reflexes normal.  Psychiatric:        Mood and Affect: Mood normal.        Behavior: Behavior normal.        Thought Content: Thought content normal.        Judgment: Judgment normal.     Results for orders placed or performed in visit on 01/09/24  CBC with Differential/Platelet   Collection Time: 01/09/24  2:17 PM  Result Value Ref Range   WBC 7.4 3.4 - 10.8 x10E3/uL   RBC 4.40 3.77 - 5.28 x10E6/uL   Hemoglobin 13.2 11.1 - 15.9 g/dL   Hematocrit 16.1 09.6 - 46.6 %   MCV 91 79 - 97 fL   MCH 30.0 26.6 - 33.0 pg   MCHC 33.2 31.5 - 35.7 g/dL   RDW 04.5 40.9 - 81.1 %   Platelets 359 150 - 450 x10E3/uL   Neutrophils 56 Not Estab. %   Lymphs 32 Not Estab. %   Monocytes 8 Not Estab. %   Eos 3 Not Estab. %   Basos 1 Not Estab. %   Neutrophils Absolute 4.2 1.4 - 7.0 x10E3/uL   Lymphocytes Absolute 2.4 0.7 - 3.1 x10E3/uL   Monocytes Absolute 0.6 0.1 - 0.9 x10E3/uL   EOS (ABSOLUTE) 0.3 0.0 - 0.4 x10E3/uL   Basophils Absolute 0.1 0.0 - 0.2 x10E3/uL   Immature Granulocytes 0 Not Estab. %   Immature Grans (Abs) 0.0 0.0 - 0.1 x10E3/uL  Comprehensive metabolic panel   Collection Time: 01/09/24  2:17 PM  Result Value Ref Range   Glucose 85 70 - 99 mg/dL   BUN 20 6 - 20 mg/dL   Creatinine, Ser 9.14 0.57 - 1.00 mg/dL   eGFR 782 >95 AO/ZHY/8.65   BUN/Creatinine Ratio 29 (H) 9 - 23   Sodium 140 134 - 144 mmol/L   Potassium 4.6 3.5 - 5.2 mmol/L   Chloride 102 96 - 106 mmol/L   CO2 22 20 - 29 mmol/L   Calcium 9.5 8.7 - 10.2 mg/dL   Total Protein 7.1 6.0 - 8.5 g/dL   Albumin 4.8 4.0 - 5.0 g/dL   Globulin, Total 2.3 1.5 - 4.5 g/dL   Bilirubin Total 0.2 0.0 - 1.2 mg/dL   Alkaline Phosphatase 99 44 - 121 IU/L   AST 16 0 - 40 IU/L   ALT 14 0 - 32 IU/L  Lipase   Collection Time: 01/09/24  2:17 PM  Result Value Ref Range   Lipase 43 14 - 72 U/L  Amylase   Collection Time: 01/09/24  2:17 PM  Result Value Ref Range   Amylase 113 (H) 31 - 110 U/L  Lipid  Panel w/o Chol/HDL Ratio  Collection Time: 01/09/24  2:17 PM  Result Value Ref Range   Cholesterol, Total 200 (H) 100 - 199 mg/dL   Triglycerides 604 (H) 0 - 149 mg/dL   HDL 60 >54 mg/dL   VLDL Cholesterol Cal 39 5 - 40 mg/dL   LDL Chol Calc (NIH) 098 (H) 0 - 99 mg/dL  TSH   Collection Time: 01/09/24  2:17 PM  Result Value Ref Range   TSH 1.260 0.450 - 4.500 uIU/mL      Assessment & Plan:   Problem List Items Addressed This Visit       Other   Postpartum anxiety   Will start her on low dose sertraline. Will start after CCY. Recheck 6 weeks. Call with any concerns.       Relevant Medications   sertraline (ZOLOFT) 25 MG tablet   Other Visit Diagnoses       Routine general medical examination at a health care facility    -  Primary   Vaccines up to date. Screening labs checked today. Pap up to date. Continue diet and exercise. Call with any concerns.        Follow up plan: Return in about 6 weeks (around 03/21/2024).   LABORATORY TESTING:  - Pap smear: up to date  IMMUNIZATIONS:   - Tdap: Tetanus vaccination status reviewed: last tetanus booster within 10 years. - Influenza: Refused - Pneumovax: Not applicable - Prevnar: Not applicable - COVID: Refused - HPV: Refused - Shingrix vaccine: Not applicable  PATIENT COUNSELING:   Advised to take 1 mg of folate supplement per day if capable of pregnancy.   Sexuality: Discussed sexually transmitted diseases, partner selection, use of condoms, avoidance of unintended pregnancy  and contraceptive alternatives.   Advised to avoid cigarette smoking.  I discussed with the patient that most people either abstain from alcohol or drink within safe limits (<=14/week and <=4 drinks/occasion for males, <=7/weeks and <= 3 drinks/occasion for females) and that the risk for alcohol disorders and other health effects rises proportionally with the number of drinks per week and how often a drinker exceeds daily limits.  Discussed  cessation/primary prevention of drug use and availability of treatment for abuse.   Diet: Encouraged to adjust caloric intake to maintain  or achieve ideal body weight, to reduce intake of dietary saturated fat and total fat, to limit sodium intake by avoiding high sodium foods and not adding table salt, and to maintain adequate dietary potassium and calcium preferably from fresh fruits, vegetables, and low-fat dairy products.    stressed the importance of regular exercise  Injury prevention: Discussed safety belts, safety helmets, smoke detector, smoking near bedding or upholstery.   Dental health: Discussed importance of regular tooth brushing, flossing, and dental visits.    NEXT PREVENTATIVE PHYSICAL DUE IN 1 YEAR. Return in about 6 weeks (around 03/21/2024).

## 2024-02-12 ENCOUNTER — Ambulatory Visit: Payer: Self-pay | Admitting: Anesthesiology

## 2024-02-12 ENCOUNTER — Encounter
Admission: RE | Disposition: A | Discharge status: Home / Self Care | Payer: Self-pay | Source: Ambulatory Visit | Attending: General Surgery

## 2024-02-12 ENCOUNTER — Other Ambulatory Visit: Payer: Self-pay

## 2024-02-12 ENCOUNTER — Ambulatory Visit
Admission: RE | Admit: 2024-02-12 | Discharge: 2024-02-12 | Disposition: A | Discharge status: Home / Self Care | Payer: Medicaid Other | Source: Ambulatory Visit | Attending: General Surgery | Admitting: General Surgery

## 2024-02-12 ENCOUNTER — Encounter: Payer: Self-pay | Admitting: General Surgery

## 2024-02-12 DIAGNOSIS — K8010 Calculus of gallbladder with chronic cholecystitis without obstruction: Secondary | ICD-10-CM | POA: Diagnosis present

## 2024-02-12 DIAGNOSIS — F32A Depression, unspecified: Secondary | ICD-10-CM | POA: Insufficient documentation

## 2024-02-12 DIAGNOSIS — K8050 Calculus of bile duct without cholangitis or cholecystitis without obstruction: Secondary | ICD-10-CM | POA: Diagnosis not present

## 2024-02-12 DIAGNOSIS — F419 Anxiety disorder, unspecified: Secondary | ICD-10-CM | POA: Insufficient documentation

## 2024-02-12 DIAGNOSIS — K828 Other specified diseases of gallbladder: Secondary | ICD-10-CM | POA: Insufficient documentation

## 2024-02-12 DIAGNOSIS — Z01812 Encounter for preprocedural laboratory examination: Secondary | ICD-10-CM

## 2024-02-12 LAB — POCT PREGNANCY, URINE: Preg Test, Ur: NEGATIVE

## 2024-02-12 SURGERY — CHOLECYSTECTOMY, ROBOT-ASSISTED, LAPAROSCOPIC
Anesthesia: General

## 2024-02-12 MED ORDER — MIDAZOLAM HCL 2 MG/2ML IJ SOLN
INTRAMUSCULAR | Status: AC
  Filled 2024-02-12: qty 2

## 2024-02-12 MED ORDER — CHLORHEXIDINE GLUCONATE CLOTH 2 % EX PADS: 6.0000 | MEDICATED_PAD | Freq: Once | CUTANEOUS | Status: DC

## 2024-02-12 MED ORDER — ORAL CARE MOUTH RINSE: 15.0000 mL | Freq: Once | OROMUCOSAL | Status: AC

## 2024-02-12 MED ORDER — HYDROMORPHONE HCL 1 MG/ML IJ SOLN
INTRAMUSCULAR | Status: AC
  Filled 2024-02-12: qty 1

## 2024-02-12 MED ORDER — OXYCODONE HCL 5 MG PO TABS
ORAL_TABLET | ORAL | Status: AC
Start: 2024-02-12 — End: ?
  Filled 2024-02-12: qty 1

## 2024-02-12 MED ORDER — BUPIVACAINE LIPOSOME 1.3 % IJ SUSP
INTRAMUSCULAR | Status: DC | PRN
  Administered 2024-02-12: 10 mL

## 2024-02-12 MED ORDER — LIDOCAINE HCL (CARDIAC) PF 100 MG/5ML IV SOSY
PREFILLED_SYRINGE | INTRAVENOUS | Status: DC | PRN
  Administered 2024-02-12: 100 mg via INTRAVENOUS

## 2024-02-12 MED ORDER — SODIUM CHLORIDE 0.9 % IV SOLN
2.0000 g | INTRAVENOUS | Status: AC
  Administered 2024-02-12: 2 g via INTRAVENOUS

## 2024-02-12 MED ORDER — INDOCYANINE GREEN 25 MG IV SOLR
1.2500 mg | Freq: Once | INTRAVENOUS | Status: AC
Start: 2024-02-12 — End: 2024-02-12
  Administered 2024-02-12: 1.25 mg via INTRAVENOUS

## 2024-02-12 MED ORDER — ACETAMINOPHEN 10 MG/ML IV SOLN
INTRAVENOUS | Status: DC | PRN
  Administered 2024-02-12: 1000 mg via INTRAVENOUS

## 2024-02-12 MED ORDER — CHLORHEXIDINE GLUCONATE 0.12 % MT SOLN
15.0000 mL | Freq: Once | OROMUCOSAL | Status: AC
  Administered 2024-02-12: 15 mL via OROMUCOSAL

## 2024-02-12 MED ORDER — FENTANYL CITRATE (PF) 100 MCG/2ML IJ SOLN
INTRAMUSCULAR | Status: DC | PRN
  Administered 2024-02-12: 25 ug via INTRAVENOUS
  Administered 2024-02-12: 100 ug via INTRAVENOUS
  Administered 2024-02-12: 50 ug via INTRAVENOUS

## 2024-02-12 MED ORDER — PROPOFOL 1000 MG/100ML IV EMUL
INTRAVENOUS | Status: AC
  Filled 2024-02-12: qty 100

## 2024-02-12 MED ORDER — PHENYLEPHRINE 80 MCG/ML (10ML) SYRINGE FOR IV PUSH (FOR BLOOD PRESSURE SUPPORT)
PREFILLED_SYRINGE | INTRAVENOUS | Status: DC | PRN
  Administered 2024-02-12: 80 ug via INTRAVENOUS
  Administered 2024-02-12 (×2): 160 ug via INTRAVENOUS

## 2024-02-12 MED ORDER — DEXAMETHASONE SODIUM PHOSPHATE 10 MG/ML IJ SOLN
INTRAMUSCULAR | Status: DC | PRN
  Administered 2024-02-12: 10 mg via INTRAVENOUS

## 2024-02-12 MED ORDER — CHLORHEXIDINE GLUCONATE 0.12 % MT SOLN
OROMUCOSAL | Status: AC
  Filled 2024-02-12: qty 15

## 2024-02-12 MED ORDER — OXYCODONE HCL 5 MG PO TABS
5.0000 mg | ORAL_TABLET | Freq: Four times a day (QID) | ORAL | 0 refills | Status: DC | PRN
Start: 2024-02-12 — End: 2024-02-29

## 2024-02-12 MED ORDER — CHLORHEXIDINE GLUCONATE CLOTH 2 % EX PADS
6.0000 | MEDICATED_PAD | Freq: Once | CUTANEOUS | Status: AC
Start: 2024-02-12 — End: 2024-02-12
  Administered 2024-02-12: 6 via TOPICAL

## 2024-02-12 MED ORDER — LACTATED RINGERS IV SOLN: INTRAVENOUS | Status: DC

## 2024-02-12 MED ORDER — BUPIVACAINE-EPINEPHRINE (PF) 0.5% -1:200000 IJ SOLN
INTRAMUSCULAR | Status: DC | PRN
  Administered 2024-02-12: 10 mL

## 2024-02-12 MED ORDER — ONDANSETRON HCL 4 MG/2ML IJ SOLN
INTRAMUSCULAR | Status: AC
  Filled 2024-02-12: qty 2

## 2024-02-12 MED ORDER — SUGAMMADEX SODIUM 200 MG/2ML IV SOLN
INTRAVENOUS | Status: DC | PRN
  Administered 2024-02-12: 250 mg via INTRAVENOUS

## 2024-02-12 MED ORDER — KETOROLAC TROMETHAMINE 30 MG/ML IJ SOLN
INTRAMUSCULAR | Status: DC | PRN
  Administered 2024-02-12: 30 mg via INTRAVENOUS

## 2024-02-12 MED ORDER — SODIUM CHLORIDE 0.9 % IV SOLN
INTRAVENOUS | Status: AC
  Filled 2024-02-12: qty 2

## 2024-02-12 MED ORDER — OXYCODONE HCL 5 MG PO TABS
5.0000 mg | ORAL_TABLET | Freq: Once | ORAL | Status: AC | PRN
  Administered 2024-02-12: 5 mg via ORAL

## 2024-02-12 MED ORDER — FENTANYL CITRATE (PF) 100 MCG/2ML IJ SOLN
INTRAMUSCULAR | Status: AC
  Filled 2024-02-12: qty 2

## 2024-02-12 MED ORDER — BUPIVACAINE-EPINEPHRINE (PF) 0.5% -1:200000 IJ SOLN
INTRAMUSCULAR | Status: AC
  Filled 2024-02-12: qty 10

## 2024-02-12 MED ORDER — BUPIVACAINE LIPOSOME 1.3 % IJ SUSP
INTRAMUSCULAR | Status: AC
  Filled 2024-02-12: qty 10

## 2024-02-12 MED ORDER — MIDAZOLAM HCL 2 MG/2ML IJ SOLN
INTRAMUSCULAR | Status: DC | PRN
  Administered 2024-02-12: 2 mg via INTRAVENOUS

## 2024-02-12 MED ORDER — LIDOCAINE HCL (PF) 2 % IJ SOLN
INTRAMUSCULAR | Status: AC
  Filled 2024-02-12: qty 5

## 2024-02-12 MED ORDER — GLYCOPYRROLATE 0.2 MG/ML IJ SOLN
INTRAMUSCULAR | Status: DC | PRN
  Administered 2024-02-12: .2 mg via INTRAVENOUS

## 2024-02-12 MED ORDER — HYDROMORPHONE HCL 1 MG/ML IJ SOLN
0.2500 mg | INTRAMUSCULAR | Status: DC | PRN
  Administered 2024-02-12 (×2): 0.5 mg via INTRAVENOUS

## 2024-02-12 MED ORDER — ONDANSETRON HCL 4 MG/2ML IJ SOLN
INTRAMUSCULAR | Status: DC | PRN
  Administered 2024-02-12: 4 mg via INTRAVENOUS

## 2024-02-12 MED ORDER — DEXMEDETOMIDINE HCL IN NACL 80 MCG/20ML IV SOLN
INTRAVENOUS | Status: DC | PRN
  Administered 2024-02-12: 8 ug via INTRAVENOUS

## 2024-02-12 MED ORDER — PROPOFOL 10 MG/ML IV BOLUS
INTRAVENOUS | Status: DC | PRN
  Administered 2024-02-12: 200 mg via INTRAVENOUS

## 2024-02-12 MED ORDER — ROCURONIUM BROMIDE 100 MG/10ML IV SOLN
INTRAVENOUS | Status: DC | PRN
  Administered 2024-02-12: 50 mg via INTRAVENOUS
  Administered 2024-02-12: 20 mg via INTRAVENOUS

## 2024-02-12 MED ORDER — OXYCODONE HCL 5 MG/5ML PO SOLN: 5.0000 mg | Freq: Once | ORAL | Status: AC | PRN

## 2024-02-12 MED ORDER — ACETAMINOPHEN 10 MG/ML IV SOLN
INTRAVENOUS | Status: AC
  Filled 2024-02-12: qty 100

## 2024-02-12 MED ORDER — DEXAMETHASONE SODIUM PHOSPHATE 10 MG/ML IJ SOLN
INTRAMUSCULAR | Status: AC
  Filled 2024-02-12: qty 1

## 2024-02-12 SURGICAL SUPPLY — 43 items
BAG PRESSURE INF REUSE 1000 (BAG) IMPLANT
CANNULA REDUCER 12-8 DVNC XI (CANNULA) ×1 IMPLANT
CAUTERY HOOK MNPLR 1.6 DVNC XI (INSTRUMENTS) ×1 IMPLANT
CLIP LIGATING HEMO O LOK GREEN (MISCELLANEOUS) ×1 IMPLANT
DERMABOND ADVANCED .7 DNX12 (GAUZE/BANDAGES/DRESSINGS) ×1 IMPLANT
DRAPE ARM DVNC X/XI (DISPOSABLE) ×4 IMPLANT
DRAPE COLUMN DVNC XI (DISPOSABLE) ×1 IMPLANT
ELECT REM PT RETURN 9FT ADLT (ELECTROSURGICAL) ×1 IMPLANT
ELECTRODE REM PT RTRN 9FT ADLT (ELECTROSURGICAL) ×1 IMPLANT
FORCEPS BPLR 8 MD DVNC XI (FORCEP) IMPLANT
FORCEPS BPLR R/ABLATION 8 DVNC (INSTRUMENTS) ×1 IMPLANT
FORCEPS PROGRASP DVNC XI (FORCEP) ×1 IMPLANT
GLOVE BIOGEL PI IND STRL 7.5 (GLOVE) ×2 IMPLANT
GLOVE SURG SYN 7.0 (GLOVE) ×4 IMPLANT
GLOVE SURG SYN 7.0 PF PI (GLOVE) ×2 IMPLANT
GOWN STRL REUS W/ TWL LRG LVL3 (GOWN DISPOSABLE) ×3 IMPLANT
GRASPER SUT TROCAR 14GX15 (MISCELLANEOUS) ×1 IMPLANT
IRRIGATOR SUCT 8 DISP DVNC XI (IRRIGATION / IRRIGATOR) IMPLANT
IV NS 1000ML BAXH (IV SOLUTION) IMPLANT
KIT PINK PAD W/HEAD ARE REST (MISCELLANEOUS) ×1 IMPLANT
KIT PINK PAD W/HEAD ARM REST (MISCELLANEOUS) ×1 IMPLANT
LABEL OR SOLS (LABEL) ×1 IMPLANT
MANIFOLD NEPTUNE II (INSTRUMENTS) ×1 IMPLANT
NDL HYPO 22X1.5 SAFETY MO (MISCELLANEOUS) ×1 IMPLANT
NDL INSUFFLATION 14GA 120MM (NEEDLE) ×1 IMPLANT
NEEDLE HYPO 22X1.5 SAFETY MO (MISCELLANEOUS) ×1 IMPLANT
NEEDLE INSUFFLATION 14GA 120MM (NEEDLE) ×1 IMPLANT
NS IRRIG 500ML POUR BTL (IV SOLUTION) ×1 IMPLANT
OBTURATOR OPTICAL STND 8 DVNC (TROCAR) ×1 IMPLANT
OBTURATOR OPTICALSTD 8 DVNC (TROCAR) ×1 IMPLANT
PACK LAP CHOLECYSTECTOMY (MISCELLANEOUS) ×1 IMPLANT
SEAL UNIV 5-12 XI (MISCELLANEOUS) ×4 IMPLANT
SET TUBE SMOKE EVAC HIGH FLOW (TUBING) ×1 IMPLANT
SOL ELECTROSURG ANTI STICK (MISCELLANEOUS) ×1 IMPLANT
SOLUTION ELECTROSURG ANTI STCK (MISCELLANEOUS) ×1 IMPLANT
SPIKE FLUID TRANSFER (MISCELLANEOUS) ×2 IMPLANT
SPONGE T-LAP 4X18 ~~LOC~~+RFID (SPONGE) IMPLANT
SUT MNCRL 4-0 27 PS-2 XMFL (SUTURE) ×1 IMPLANT
SUT VICRYL 0 UR6 27IN ABS (SUTURE) ×1 IMPLANT
SUTURE MNCRL 4-0 27XMF (SUTURE) ×1 IMPLANT
SYS BAG RETRIEVAL 10MM (BASKET) ×1 IMPLANT
SYSTEM BAG RETRIEVAL 10MM (BASKET) ×1 IMPLANT
WATER STERILE IRR 500ML POUR (IV SOLUTION) ×1 IMPLANT

## 2024-02-12 NOTE — H&P (Signed)
 No changes to below H and P, procced as planned. All r/b/a of robotic assisted cholecystectomy discussed with patient.  CC: Biliary Colic History of Present Illness Valerie Garner is a 28 y.o. female with no significant past medical history who presents in consultation for biliary colic.  The patient reports that in December she developed epigastric and right upper quadrant pain associated with nausea and vomiting.  She says she has had several of those attacks that wake her up at night.  She says that when she does not have a full on attack she does feel like she gets some discomfort after she eats.  She denies any fevers or chills.  She has had episodes of emesis with this.  She also reports some diarrhea.  She presented to her primary care doctor with this and labs were obtained that were within normal limits and she had a right upper quadrant ultrasound that was consistent with cholelithiasis..   Past Medical History     Past Medical History:  Diagnosis Date   Postpartum depression associated with first pregnancy                   Past Surgical History:  Procedure Laterality Date   adenoidectomy       TONSILLECTOMY Bilateral     WISDOM TOOTH EXTRACTION              Allergies  No Known Allergies           Current Outpatient Medications  Medication Sig Dispense Refill   norethindrone (ORTHO MICRONOR) 0.35 MG tablet Take 1 tablet (0.35 mg total) by mouth daily. 28 tablet 11   Prenatal Vit-Fe Fumarate-FA (PRENATAL PO) Take by mouth.          No current facility-administered medications for this visit.        Family History      Family History  Problem Relation Age of Onset   Healthy Mother     Healthy Father     Leukemia Brother     Diabetes Paternal Grandfather     Breast cancer Neg Hx     Ovarian cancer Neg Hx     Colon cancer Neg Hx              Social History Social History  Social History         Tobacco Use   Smoking status: Never       Passive exposure: Never   Smokeless tobacco: Never  Vaping Use   Vaping status: Never Used  Substance Use Topics   Alcohol use: Not Currently   Drug use: Never            ROS Full ROS of systems performed and is otherwise negative there than what is stated in the HPI   Physical Exam Blood pressure 109/71, pulse 76, temperature 98.2 F (36.8 C), height 5\' 4"  (1.626 m), weight 223 lb (101.2 kg), SpO2 98%, currently breastfeeding.   Alert and oriented x 3, no acute distress, moving all extremity spontaneously, good auscultation bilaterally, regular rate and rhythm, abdomen is soft, obese nontender and nondistended without any surgical scars .  PERRLA Data Reviewed Labs reviewed and total bilirubin is normal and no increase in her alkaline phosphatase.  I reviewed independently her ultrasound that showed gallstones within the gallbladder wall without any gallbladder wall thickening or pericholecystic fluid   I have personally reviewed the patient's imaging and medical records.     Assessment Assessment  28 year old with biliary colic   Plan Plan I discussed risk and benefits alternatives of the procedure of robotic assisted cholecystectomy.  Discussed risk of infection, bleeding, bowel injury, injury to the common bile duct, bile leak and retained stone.  She understands these risks and wishes to proceed.  We will use ICG       Valerie Garner 02/01/2024, 3:18 PM

## 2024-02-12 NOTE — Anesthesia Procedure Notes (Signed)
 Procedure Name: Intubation Date/Time: 02/12/2024 7:38 AM  Performed by: Ginger Carne, CRNAPre-anesthesia Checklist: Patient identified, Emergency Drugs available, Suction available, Patient being monitored and Timeout performed Patient Re-evaluated:Patient Re-evaluated prior to induction Oxygen Delivery Method: Circle system utilized Preoxygenation: Pre-oxygenation with 100% oxygen Induction Type: IV induction Ventilation: Mask ventilation without difficulty Laryngoscope Size: McGrath and 3 Grade View: Grade I Tube type: Oral Tube size: 6.5 mm Number of attempts: 1 Airway Equipment and Method: Stylet and Video-laryngoscopy Placement Confirmation: ETT inserted through vocal cords under direct vision, positive ETCO2 and breath sounds checked- equal and bilateral Secured at: 20 cm Tube secured with: Tape Dental Injury: Teeth and Oropharynx as per pre-operative assessment

## 2024-02-12 NOTE — Op Note (Signed)
 Robotic assisted laparoscopic Cholecystectomy  Pre-operative Diagnosis: Biliary Colic  Post-operative Diagnosis: Same  Procedure:  Robotic assisted laparoscopic Cholecystectomy  Surgeon: Baker Pierini, MD  Anesthesia: Gen. with endotracheal tube  Findings: Mildly inflamed gallbladder with stones  Estimated Blood Loss: 10cc       Specimens: Gallbladder           Complications: none   Procedure Details  The patient was seen again in the Holding Room. The benefits, complications, treatment options, and expected outcomes were discussed with the patient. The risks of bleeding, infection, recurrence of symptoms, failure to resolve symptoms, bile duct damage, bile duct leak, retained common bile duct stone, bowel injury, any of which could require further surgery and/or ERCP, stent, or papillotomy were reviewed with the patient. The likelihood of improving the patient's symptoms with return to their baseline status is good.  The patient and/or family concurred with the proposed plan, giving informed consent.  The patient was taken to Operating Room, identified  and the procedure verified as robotic Cholecystectomy.  A Time Out was held and the above information confirmed.  Prior to the induction of general anesthesia, antibiotic prophylaxis was administered. VTE prophylaxis was in place. General endotracheal anesthesia was then administered and tolerated well. After the induction, the abdomen was prepped with Chloraprep and draped in the sterile fashion. The patient was positioned in the supine position.  A veress needle was inserted into the abdomen using standard drop technique. An 8mm infra-umbilical robotic port was then placed under direct visualization. There was no injury noted at the site of veress needle insertion. Two right sided abdominal 8mm ports followed by an 8mm left abdominal robotic ports were placed under direct visualization. The left sided abdominal port was then upsized to  a 12mm robotic port.  The patient was positioned  in reverse Trendelenburg, robot was brought to the surgical field and docked in the standard fashion.  We made sure all the instrumentation was kept indirect view at all times and that there were no collision between the arms. I scrubbed out and went to the console.  The gallbladder was identified, the fundus grasped and retracted cephalad. Adhesions were lysed bluntly. The infundibulum was grasped and retracted laterally, exposing the peritoneum overlying the triangle of Calot. This was then divided and exposed in a blunt fashion. An extended critical view of the cystic duct and cystic artery was obtained.  The cystic duct was clearly identified and bluntly dissected.   Artery and duct were double clipped and divided. Using ICG cholangiography we visualized the cystic duct. The gallbladder was taken from the gallbladder fossa in a retrograde fashion with the electrocautery.  Hemostasis was achieved with the electrocautery. nspection of the right upper quadrant was performed. No bleeding, bile duct injury or leak, or bowel injury was noted. Robotic instruments and robotic arms were undocked in the standard fashion.  I scrubbed back in.  The gallbladder was removed and placed in an Endocatch bag.   The left lower quadrant fascia was then closed with a 0 vicryl using a suture needle passer. The pre-peritoneal space was then infiltrated with liposomal bupivicaine and marcaine solution. Pneumoperitoneum was released.  4-0 subcuticular Monocryl was used to close the skin. Dermabond was  applied.  The patient was then extubated and brought to the recovery room in stable condition. Sponge, lap, and needle counts were correct at closure and at the conclusion of the case.  Baker Pierini, M.D. [facility]

## 2024-02-12 NOTE — Anesthesia Procedure Notes (Signed)
 Date/Time: 02/12/2024 8:28 AM  Performed by: Ginger Carne, CRNAVentilation: Oral airway inserted - appropriate to patient size

## 2024-02-12 NOTE — Anesthesia Postprocedure Evaluation (Signed)
 Anesthesia Post Note  Patient: Valerie Garner  Procedure(s) Performed: CHOLECYSTECTOMY, ROBOT-ASSISTED, LAPAROSCOPIC INDOCYANINE GREEN FLUORESCENCE IMAGING (ICG)  Patient location during evaluation: PACU Anesthesia Type: General Level of consciousness: awake and alert Pain management: pain level controlled Vital Signs Assessment: post-procedure vital signs reviewed and stable Respiratory status: spontaneous breathing, nonlabored ventilation, respiratory function stable and patient connected to nasal cannula oxygen Cardiovascular status: blood pressure returned to baseline and stable Postop Assessment: no apparent nausea or vomiting Anesthetic complications: no   No notable events documented.   Last Vitals:  Vitals:   02/12/24 0945 02/12/24 0956  BP: (!) 102/59 108/63  Pulse: 85 70  Resp: 20   Temp: 36.8 C 36.9 C  SpO2: 98% 98%    Last Pain:  Vitals:   02/12/24 0956  TempSrc: Temporal  PainSc: 2                  Louie Boston

## 2024-02-12 NOTE — Transfer of Care (Signed)
 Immediate Anesthesia Transfer of Care Note  Patient: Valerie Garner [facility]  Procedure(s) Performed: CHOLECYSTECTOMY, ROBOT-ASSISTED, LAPAROSCOPIC INDOCYANINE GREEN FLUORESCENCE IMAGING (ICG)  Patient Location: PACU  Anesthesia Type:General  Level of Consciousness: awake, alert , and oriented  Airway & Oxygen Therapy: Patient Spontanous Breathing and Patient connected to face mask oxygen  Post-op Assessment: Report given to RN and Post -op Vital signs reviewed and stable  Post vital signs: Reviewed and stable  Last Vitals:  Vitals Value Taken Time  BP 112/72 02/12/24 0859  Temp 36.7 C 02/12/24 0856  Pulse 81 02/12/24 0859  Resp 20 02/12/24 0859  SpO2 100 % 02/12/24 0859    Last Pain:  Vitals:   02/12/24 0856  TempSrc:   PainSc: 8          Complications: No notable events documented.

## 2024-02-12 NOTE — Anesthesia Preprocedure Evaluation (Signed)
 Anesthesia Evaluation  Patient identified by MRN, date of birth, ID band Patient awake    Reviewed: Allergy & Precautions, NPO status , Patient's Chart, lab work & pertinent test results  History of Anesthesia Complications Negative for: history of anesthetic complications  Airway Mallampati: III  TM Distance: >3 FB Neck ROM: full    Dental no notable dental hx.    Pulmonary neg pulmonary ROS   Pulmonary exam normal        Cardiovascular negative cardio ROS Normal cardiovascular exam     Neuro/Psych  PSYCHIATRIC DISORDERS Anxiety Depression    negative neurological ROS     GI/Hepatic negative GI ROS, Neg liver ROS,,,  Endo/Other  negative endocrine ROS    Renal/GU      Musculoskeletal   Abdominal   Peds  Hematology negative hematology ROS (+)   Anesthesia Other Findings Past Medical History: No date: Biliary colic No date: Obesity (BMI 35.0-39.9 without comorbidity) No date: Postpartum depression associated with first pregnancy  Past Surgical History: No date: TONSILLECTOMY AND ADENOIDECTOMY; Bilateral No date: WISDOM TOOTH EXTRACTION  BMI    Body Mass Index: 38.28 kg/m      Reproductive/Obstetrics negative OB ROS                             Anesthesia Physical Anesthesia Plan  ASA: 2  Anesthesia Plan: General ETT   Post-op Pain Management: Toradol IV (intra-op)*, Ofirmev IV (intra-op)*, Dilaudid IV and Ketamine IV*   Induction: Intravenous  PONV Risk Score and Plan: 3 and Ondansetron, Dexamethasone, Midazolam and Treatment may vary due to age or medical condition  Airway Management Planned: Oral ETT  Additional Equipment:   Intra-op Plan:   Post-operative Plan: Extubation in OR  Informed Consent: I have reviewed the patients History and Physical, chart, labs and discussed the procedure including the risks, benefits and alternatives for the proposed anesthesia  with the patient or authorized representative who has indicated his/her understanding and acceptance.     Dental Advisory Given  Plan Discussed with: Anesthesiologist, CRNA and Surgeon  Anesthesia Plan Comments: (Patient consented for risks of anesthesia including but not limited to:  - adverse reactions to medications - damage to eyes, teeth, lips or other oral mucosa - nerve damage due to positioning  - sore throat or hoarseness - Damage to heart, brain, nerves, lungs, other parts of body or loss of life  Patient voiced understanding and assent.)       Anesthesia Quick Evaluation

## 2024-02-13 LAB — SURGICAL PATHOLOGY

## 2024-02-28 ENCOUNTER — Encounter: Admitting: Physician Assistant

## 2024-02-29 ENCOUNTER — Encounter: Payer: Self-pay | Admitting: General Surgery

## 2024-02-29 ENCOUNTER — Ambulatory Visit (INDEPENDENT_AMBULATORY_CARE_PROVIDER_SITE_OTHER): Admitting: General Surgery

## 2024-02-29 VITALS — BP 119/79 | HR 76 | Temp 99.0°F | Ht 64.0 in | Wt 229.2 lb

## 2024-02-29 DIAGNOSIS — K8050 Calculus of bile duct without cholangitis or cholecystitis without obstruction: Secondary | ICD-10-CM

## 2024-02-29 DIAGNOSIS — Z09 Encounter for follow-up examination after completed treatment for conditions other than malignant neoplasm: Secondary | ICD-10-CM

## 2024-02-29 NOTE — Progress Notes (Signed)
 Outpatient Surgical Follow Up  02/29/2024  Valerie Garner is an 28 y.o. female.   Chief Complaint  Patient presents with   Routine Post Op    Lap choley    HPI: Patient returns today status post robotic assisted cholecystectomy on 10 March.  She reports doing well.  She had some pain the first couple days after surgery requiring narcotics but since then has been doing well.  She denies any nausea or vomiting.  She is having normal bowel movements without any diarrhea.  She denies any drainage or redness around her incisions.  She says that she still has some pain in the left lower quadrant incision when she moves around but it is minimal.  Past Medical History:  Diagnosis Date   Biliary colic    Obesity (BMI 35.0-39.9 without comorbidity)    Postpartum depression associated with first pregnancy     Past Surgical History:  Procedure Laterality Date   TONSILLECTOMY AND ADENOIDECTOMY Bilateral    WISDOM TOOTH EXTRACTION      Family History  Problem Relation Age of Onset   Healthy Mother    Healthy Father    Leukemia Brother    Diabetes Paternal Grandfather    Breast cancer Neg Hx    Ovarian cancer Neg Hx    Colon cancer Neg Hx     Social History:  reports that she has never smoked. She has never been exposed to tobacco smoke. She has never used smokeless tobacco. She reports that she does not currently use alcohol. She reports that she does not use drugs.  Allergies: No Known Allergies  Medications reviewed.    ROS Full ROS performed and is otherwise negative other than what is stated in HPI   BP 119/79   Pulse 76   Temp 99 F (37.2 C) (Oral)   Ht 5\' 4"  (1.626 m)   Wt 229 lb 3.2 oz (104 kg)   LMP  (LMP Unknown)   SpO2 98%   BMI 39.34 kg/m   Physical Exam Abdomen is soft, very mild tenderness right over the left lower quadrant incision.  There is still some swelling underneath the incision but no redness or drainage.  The incisions are clean dry and  intact and the glue has fallen off.  Pathology consistent with chronic cholecystitis and Coley lithiasis  No results found for this or any previous visit (from the past 48 hours). No results found.  Assessment/Plan:  Patient status post robotic assisted cholecystectomy.  Doing well without any concern of postoperative complications.  Instructed her that she should continue lifting restrictions for another 2 weeks.  She can liberalize her diet.  She can follow-up with [place].   Baker Pierini, M.D. [facility]

## 2024-02-29 NOTE — Patient Instructions (Signed)

## 2024-03-25 ENCOUNTER — Ambulatory Visit: Admitting: Family Medicine

## 2024-04-15 ENCOUNTER — Telehealth: Payer: Self-pay | Admitting: Lactation Services

## 2024-04-15 ENCOUNTER — Encounter: Payer: Self-pay | Admitting: Licensed Practical Nurse

## 2024-04-15 NOTE — Telephone Encounter (Signed)
 LC received a phone call from [place] to discuss what she thought was mastitis.  Patient stated that the pain started this morning.  She noticed that her nipple was red and uncomfortable this morning.  Then at her 1030a feeding w/ her child at the breast, the pain was very intense throughout the whole feeding.  Patient described it as "sharp pain throughout the breast, kind of like glass.".  Brooke expressed that when the nursing session is over, the pain is just in the nipple.   LC asked a couple of troubleshooting questions; - Does her nipple look shiny or red? Nipple is red - Are there any white patches in infants mouth or tongue? Patient stated no.  - Any red areas on her breast? Patient stated no. - No fever - No hard spots or fullness in her breast.  LC stated that it sounded like it possibly be something called thrush but when it is best she goes to her provider and get a diagnosis.  In the meantime, LC informed patient to wash her breast between feedings and her hands.  Try taking a warm cloth and putting over her breast to see if that helps with the shooting pains she is feeling during the nursing sessions.    LC informed patient that if she needed to see lactation that she would need to get her provider to send an outpatient referral to our fax.  Encouraged patient to reach out after her appointment on 04/16/24 if there was anything that we could do.  Patient verbalized understanding.

## 2024-04-16 ENCOUNTER — Ambulatory Visit (INDEPENDENT_AMBULATORY_CARE_PROVIDER_SITE_OTHER): Admitting: Licensed Practical Nurse

## 2024-04-16 VITALS — BP 130/84 | HR 66 | Ht 64.0 in | Wt 235.5 lb

## 2024-04-16 DIAGNOSIS — B370 Candidal stomatitis: Secondary | ICD-10-CM | POA: Diagnosis not present

## 2024-04-16 MED ORDER — FLUCONAZOLE 200 MG PO TABS: 200.0000 mg | ORAL_TABLET | Freq: Every day | ORAL | 0 refills | Status: DC

## 2024-04-16 NOTE — Progress Notes (Signed)
 Johnson, Megan P, DO   No chief complaint on file.   HPI:      Valerie Garner is a 28 y.o. [phone] whose LMP was No LMP recorded. (Menstrual status: Oral contraceptives)., presents today for pain with breast feeding. Since yesterday morning Valerie Garner has had pain in her left nipple with nursing, the pain is burning/stinging and feels like "I am being cut with shards of glass" from the nipple to the back of the breast. She has not seen any white patches on the infant's mouth.     Patient Active Problem List   Diagnosis Date Noted   Biliary colic 02/12/2024   Postpartum anxiety 02/08/2024   Obesity, morbid (HCC) 01/09/2024    Past Surgical History:  Procedure Laterality Date   TONSILLECTOMY AND ADENOIDECTOMY Bilateral    WISDOM TOOTH EXTRACTION      Family History  Problem Relation Age of Onset   Healthy Mother    Healthy Father    Leukemia Brother    Diabetes Paternal Grandfather    Breast cancer Neg Hx    Ovarian cancer Neg Hx    Colon cancer Neg Hx     Social History   Socioeconomic History   Marital status: Married    Spouse name: Mara Seminole   Number of children: 3   Years of education: 14   Highest education level: Associate degree: academic program  Occupational History   Occupation: [employer]  Tobacco Use   Smoking status: Never    Passive exposure: Never   Smokeless tobacco: Never  Vaping Use   Vaping status: Never Used  Substance and Sexual Activity   Alcohol use: Not Currently   Drug use: Never   Sexual activity: Yes    Partners: Male    Birth control/protection: Pill  Other Topics Concern   Not on file  Social History Narrative   Not on file   Social Drivers of Health   Financial Resource Strain: Low Risk  (02/05/2024)   Overall Financial Resource Strain (CARDIA)    Difficulty of Paying Living Expenses: Not very hard  Food Insecurity: No Food Insecurity (02/05/2024)   Hunger Vital Sign    Worried About Running Out of Food in  the Last Year: Never true    Ran Out of Food in the Last Year: Never true  Transportation Needs: No Transportation Needs (02/05/2024)   PRAPARE - Administrator, Civil Service (Medical): No    Lack of Transportation (Non-Medical): No  Physical Activity: Unknown (02/05/2024)   Exercise Vital Sign    Days of Exercise per Week: 0 days    Minutes of Exercise per Session: Not on file  Stress: No Stress Concern Present (02/05/2024)   [employer] of Occupational Health - Occupational Stress Questionnaire    Feeling of Stress : Only a little  Social Connections: Socially Integrated (02/05/2024)   Social Connection and Isolation Panel [NHANES]    Frequency of Communication with Friends and Family: Three times a week    Frequency of Social Gatherings with Friends and Family: Once a week    Attends Religious Services: More than 4 times per year    Active Member of [employer] or Organizations: Yes    Attends Banker Meetings: More than 4 times per year    Marital Status: Married  Catering manager Violence: Not At Risk (09/20/2023)   Humiliation, Afraid, Rape, and Kick questionnaire    Fear of Current or Ex-Partner: No  Emotionally Abused: No    Physically Abused: No    Sexually Abused: No    Outpatient Medications Prior to Visit  Medication Sig Dispense Refill   norethindrone  (ORTHO MICRONOR ) 0.35 MG tablet Take 1 tablet (0.35 mg total) by mouth daily. 28 tablet 11   Prenatal Vit-Fe Fumarate-FA (PRENATAL PO) Take 1 tablet by mouth daily.     sertraline  (ZOLOFT ) 25 MG tablet Take 1/2 tab daily for 1 week, then increase to 1 tab daily 30 tablet 3   No facility-administered medications prior to visit.      ROS:  Review of Systems see HPI    OBJECTIVE:   Vitals:  BP 130/84 (BP Location: Left Arm, Patient Position: Sitting)   Pulse 66   Ht 5\' 4"  (1.626 m)   Wt 235 lb 8 oz (106.8 kg)   Breastfeeding Yes   BMI 40.42 kg/m   Physical Exam Constitutional:       Appearance: Normal appearance.  Pulmonary:     Effort: Pulmonary effort is normal.  Chest:     Comments: Right breast: no redness or masses, nipple erect and intact with a rosy appearance,   Left breast: no redness or masses, nipple erect and intact with a rosy appearance.  Neurological:     Mental Status: She is alert.     Results: No results found for this or any previous visit (from the past 24 hours).   Assessment/Plan: Thrush - Plan: fluconazole  (DIFLUCAN ) 200 MG tablet    Meds ordered this encounter  Medications   fluconazole  (DIFLUCAN ) 200 MG tablet    Sig: Take 1 tablet (200 mg total) by mouth daily. Take 2 tablets (400mg ) once then 1 tablet (200mg ) daily for 2 weeks or until the pain goes away    Dispense:  15 tablet    Refill:  0   -recommend contacting pediatrician regarding thrush as they may want to treat the infant.   [facility], CNM 04/23/2024 3:46 PM

## 2024-06-02 ENCOUNTER — Encounter: Payer: Self-pay | Admitting: Family Medicine

## 2024-06-17 ENCOUNTER — Other Ambulatory Visit: Payer: Self-pay | Admitting: Family Medicine

## 2024-06-18 NOTE — Telephone Encounter (Signed)
 Requested Prescriptions  Pending Prescriptions Disp Refills   sertraline  (ZOLOFT ) 25 MG tablet [Pharmacy Med Name: Sertraline  HCl 25 MG Oral Tablet] 90 tablet 0    Sig: TAKE ONE-HALF TABLET BY MOUTH DAILY FOR 7 DAYS. THEN INCREASE TO TAKE ONE TABLET BY MOUTH ONCE DAILY THEREAFTER     Psychiatry:  Antidepressants - SSRI - sertraline  Passed - 06/18/2024  3:49 PM      Passed - AST in normal range and within 360 days    AST  Date Value Ref Range Status  01/09/2024 16 0 - 40 IU/L Final         Passed - ALT in normal range and within 360 days    ALT  Date Value Ref Range Status  01/09/2024 14 0 - 32 IU/L Final         Passed - Completed PHQ-2 or PHQ-9 in the last 360 days      Passed - Valid encounter within last 6 months    Recent Outpatient Visits           4 months ago Routine general medical examination at a health care facility   [facility], Megan P, DO   5 months ago Generalized abdominal pain   [facility], [place], DO

## 2024-09-10 ENCOUNTER — Encounter: Payer: Self-pay | Admitting: Certified Nurse Midwife

## 2024-09-10 ENCOUNTER — Ambulatory Visit (INDEPENDENT_AMBULATORY_CARE_PROVIDER_SITE_OTHER): Admitting: Certified Nurse Midwife

## 2024-09-10 VITALS — BP 109/74 | HR 73 | Ht 64.0 in | Wt 239.8 lb

## 2024-09-10 DIAGNOSIS — G43909 Migraine, unspecified, not intractable, without status migrainosus: Secondary | ICD-10-CM

## 2024-09-10 DIAGNOSIS — R454 Irritability and anger: Secondary | ICD-10-CM | POA: Diagnosis not present

## 2024-09-10 DIAGNOSIS — G43D1 Abdominal migraine, intractable: Secondary | ICD-10-CM

## 2024-09-10 DIAGNOSIS — Z3009 Encounter for other general counseling and advice on contraception: Secondary | ICD-10-CM | POA: Diagnosis not present

## 2024-09-10 MED ORDER — SERTRALINE HCL 50 MG PO TABS: 50.0000 mg | ORAL_TABLET | Freq: Every day | ORAL | 3 refills | Status: AC

## 2024-09-10 MED ORDER — SUMATRIPTAN SUCCINATE 50 MG PO TABS: 50.0000 mg | ORAL_TABLET | Freq: Every day | ORAL | 0 refills | Status: AC | PRN

## 2024-09-10 NOTE — Progress Notes (Signed)
 Subjective:    Valerie Garner is a 28 y.o. female who presents for contraception counseling. She is currently taking POP but does not want to have to take a pill everyday. She is interested in a LARC method.  The patient is sexually active.   2. She is feeling irritable and feeling like she needs to increase her zolft. She is having panic thoughts of forgetting her baby and leaving the house without her. She is currently taking 25 mg zoloft  daily but does not feel like it is helping. She state she was started on it by her PCP and she was scarred to take it originally but is feeling out of sorts.   3.  Migraines and headaches : pt is having headaches every other day with a 8/10 migraine once a week. She notes that at it worse she is light sensitive, and has nausea. She is unable to function. She has used ibuprofen  but it does not help much for the migraines.   Menstrual History: OB History     Gravida  3   Para  3   Term  3   Preterm      AB      Living  3      SAB      IAB      Ectopic      Multiple  0   Live Births  3            Patient's last menstrual period was 09/02/2024 (exact date).    The following portions of the patient's history were reviewed and updated as appropriate: allergies, current medications, past family history, past medical history, past social history, past surgical history, and problem list.  Review of Systems Pertinent items are noted in HPI.   Objective:    No exam performed today, not indicated for birth control .   Assessment:    28 y.o., considering switching for POP to nexplanon  Migraine headaches Irritability   Plan:    Increase zoloft  50 mg daily . Discussed referral for counseling. She declines at this time due to insurance issues. Discussed referral to neurology but again declines at this time due to insurance. Orders placed for Imitrex. She will follow up prn of placement of Nexplanon .   Zelda Hummer, CNM

## 2024-09-24 ENCOUNTER — Ambulatory Visit

## 2024-09-24 VITALS — BP 123/73 | HR 68 | Wt 242.0 lb

## 2024-09-24 DIAGNOSIS — Z3009 Encounter for other general counseling and advice on contraception: Secondary | ICD-10-CM

## 2024-09-24 DIAGNOSIS — Z309 Encounter for contraceptive management, unspecified: Secondary | ICD-10-CM | POA: Diagnosis not present

## 2024-09-24 DIAGNOSIS — Z30017 Encounter for initial prescription of implantable subdermal contraceptive: Secondary | ICD-10-CM

## 2024-09-24 MED ORDER — ETONOGESTREL 68 MG ~~LOC~~ IMPL
68.0000 mg | DRUG_IMPLANT | Freq: Once | SUBCUTANEOUS | Status: AC
  Administered 2024-09-24: 68 mg via SUBCUTANEOUS

## 2024-09-24 NOTE — Addendum Note (Signed)
 Addended by: SUELLA BOSS on: 09/24/2024 11:36 AM   Modules accepted: Orders

## 2024-09-24 NOTE — Progress Notes (Signed)
 [employer] [facility] [address], [address] [place] [address] Main phone: [phone]  Family Planning Visit - Initial Visit  Subjective:  Valerie Garner is a 28 y.o.  [phone]   being seen today for an initial annual visit and to discuss reproductive life planning.  The patient is currently using oral contraceptive for pregnancy prevention. Patient does not want a pregnancy in the next year.   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 Taking OCPs but does take her pill late on occasion and would like a longer acting/highly effective method. LMP was 09/02/24, periods are fairly regular 1 year postpartum. Discussed taking a home pregnancy test in ~2 weeks to ensure not pregnant.  Unsure about future babies - has three children. Has one year old and still breastfeeding, going well.  Patient denies other problems or concerns.  Review of Systems  All other systems reviewed and are negative.  Diabetes screening This patient is 28 y.o. with a BMI of Body mass index is 41.54 kg/m.SABRA  Is patient eligible for diabetes screening (age >35 and BMI >25)?  no  Was Hgb A1c ordered? no  STI screening Patient reports 1 of partners in last year.  Does this patient desire STI screening?  No - declines  Hepatitis C screening Has patient been screened once for HCV in the past?  Yes  No results found for: HCVAB  Cervical Cancer Screening  Due in  2026 Result Date Procedure Results Follow-ups  05/04/2022 Cytology - PAP Adequacy: Satisfactory for evaluation; transformation zone component PRESENT. Diagnosis: - Negative for intraepithelial lesion or malignancy (NILM)   10/02/2018 HM PAP SMEAR      Health Maintenance Due  Topic Date Due   Hepatitis B Vaccines 19-59 Average Risk (1 of 3 - 19+ 3-dose series) Never done   Influenza Vaccine  07/05/2024   The following portions of the patient's history were reviewed and updated as appropriate: allergies, current medications, past family history, past medical history, past social history, past surgical history and problem list. Problem list updated.  See flowsheet for further details and programmatic requirements Hyperlink available at the top of the signed note in blue.  Flow sheet content below:  Pregnancy Intention Screening Does the patient want to become pregnant in the next year?: No Does the patient's partner want to become pregnant in the next year?: No Does the patient currently take folic acid or women's MVI, or a prenatal viitamin?: No Does the patient or their partner want to learn more about planning a healthy pregnancy?: No Would the patient like to discuss contraceptive options today?: Yes Other:  Password: [email or web address] Contraception History Past methods of contraception used by patient:: Contraceptive Pill Adverse effects associated with Contraceptive Pill: missing pills Sexual History What age did you start your period?: 13 How often do you have your period?: irregular Date of last sex?: 09/23/24 Has the patient had unprotected sex within the last 5 days?: No Do you have sex  with men, women, both men and women?: Men only In the past 2 months how many partners have you had sex with?: 1 In the past 12 months, how many partners have you had sex with?: 1 Is it possible that any of your sex partners in the past 12 months had sex with someone else whild they were still in a  sexual relationship with you?: No What ways do you have sex?: Vaginal Do you or your partner use condoms and/or dental dams every time you have vaginal, oral or anal sex?: No Do you douche?: No Date of last HIV test?: 02/26/21 Have you ever had an STD?: No Have any of your partners had an STD?: No Have you or your partner ever shot up drugs?: No Have any of your partners used drugs in the past?: No Have you or your partners exchanged money or drugs for sex?: No  Objective:   Vitals:   09/24/24 0923  BP: 123/73  Pulse: 68  Weight: 242 lb (109.8 kg)   Physical Exam Vitals and nursing note reviewed.  Constitutional:      Appearance: Normal appearance.  HENT:     Head: Normocephalic.     Mouth/Throat:     Mouth: Mucous membranes are moist.  Cardiovascular:     Rate and Rhythm: Normal rate.     Heart sounds: Normal heart sounds, S1 normal and S2 normal.  Pulmonary:     Effort: Pulmonary effort is normal.     Breath sounds: Normal breath sounds.  Abdominal:     Palpations: Abdomen is soft.  Genitourinary:    Comments: Genital exam not indicated Musculoskeletal:        General: Normal range of motion.  Lymphadenopathy:     Head:     Right side of head: No submandibular, preauricular or posterior auricular adenopathy.     Left side of head: No submandibular, preauricular or posterior auricular adenopathy.     Cervical: No cervical adenopathy.     Upper Body:     Right upper body: No supraclavicular or axillary adenopathy.     Left upper body: No supraclavicular or axillary adenopathy.  Skin:    General: Skin is warm and dry.  Neurological:     Mental Status: She is alert and oriented to person, place, and time.  Psychiatric:        Mood and Affect: Mood normal.    Assessment and Plan:  Valerie Garner is a 28 y.o. female presenting to the [facility] Department for an initial annual wellness/contraceptive visit  Family planning Contraception  counseling:  Reviewed options based on patient desire and reproductive life plan. Patient is interested in Hormonal Implant. This was provided to the patient today.  Risks, benefits, and typical effectiveness rates were reviewed.  Questions were answered.  Written information was also given to the patient to review.    The patient will follow up in  1 years for surveillance.  The patient was told to call with any further questions, or with any concerns about this method of contraception.  Emphasized use of condoms 100% of the time for STI prevention.  Emergency Contraception Precautions (ECP): Patient assessed for need of ECP. She is not a candidate based on OCPs used correctly without missed doses.   2. Nexplanon  insertion  Procedure:  Nexplanon  Insertion  Patient identified, informed consent performed, consent signed.   Patient does understand that irregular bleeding is a very common side effect of this medication.  She was advised to have backup contraception after placement. Patient was determined to meet WHO criteria for not being pregnant. Appropriate time out taken.  The insertion site was identified 8-10 cm (3-4 inches) from the medial epicondyle of the humerus and 3-5 cm (1.25-2 inches) posterior to (below) the sulcus (groove) between the biceps and triceps muscles of the patient's left arm and marked.  Patient was prepped with alcohol swab and then injected with 3 ml of 1% lidocaine .  Arm was prepped with chlorhexidene, Nexplanon  removed from packaging,  Device confirmed in needle, then inserted full length of needle and withdrawn per handbook instructions. Nexplanon  was able to palpated in the patient's arm; patient palpated the insert herself. There was minimal blood loss.  Patient insertion site covered with guaze and a pressure bandage to reduce any bruising.  The patient tolerated the procedure well and was given post procedure instructions.    Nexplanon :   Counseled patient to take  OTC analgesic starting as soon as lidocaine  starts to wear off and take regularly for at least 48 hr to decrease discomfort.  Specifically to take with food or milk to decrease stomach upset and for IB 600 mg (3 tablets) every 6 hrs; IB 800 mg (4 tablets) every 8 hrs; or Aleve 2 tablets every 12 hrs.   Return in about 1 year (around 09/24/2025).  No future appointments.  Damien FORBES Satchel, NP

## 2024-09-24 NOTE — Progress Notes (Signed)
 Pt is here for PE/Nexplanon  insertion. Nexplanon  inserted successfully into Lt Arm by Damien Satchel, [facility] and pt tolerated well to the  process with no complications. Opportunity given to pt to ask questions for any clarifications. Questions Answered. Wilkie Drought, RN.

## 2024-09-27 NOTE — Addendum Note (Signed)
 Addended by: ROSABEL PERKINS E on: 09/27/2024 04:45 PM   Modules accepted: Level of Service
